# Patient Record
Sex: Female | Born: 1980 | Hispanic: Yes | Smoking: Current every day smoker
Health system: Southern US, Community
[De-identification: ages and names within clinical notes are randomized; demographics above are authoritative.]

## PROBLEM LIST (undated history)

## (undated) DIAGNOSIS — N189 Chronic kidney disease, unspecified: Secondary | ICD-10-CM

## (undated) DIAGNOSIS — I10 Essential (primary) hypertension: Secondary | ICD-10-CM

## (undated) DIAGNOSIS — M329 Systemic lupus erythematosus, unspecified: Secondary | ICD-10-CM

## (undated) DIAGNOSIS — D649 Anemia, unspecified: Secondary | ICD-10-CM

## (undated) HISTORY — PX: HERNIA REPAIR: SHX51

---

## 2014-09-05 ENCOUNTER — Encounter (HOSPITAL_COMMUNITY): Payer: Self-pay | Admitting: Emergency Medicine

## 2014-09-05 ENCOUNTER — Emergency Department (HOSPITAL_COMMUNITY)
Admission: EM | Admit: 2014-09-05 | Discharge: 2014-09-05 | Disposition: A | Discharge status: Home / Self Care | Payer: Medicaid Other | Attending: Emergency Medicine | Admitting: Emergency Medicine

## 2014-09-05 DIAGNOSIS — Z720 Tobacco use: Secondary | ICD-10-CM | POA: Insufficient documentation

## 2014-09-05 DIAGNOSIS — Z8739 Personal history of other diseases of the musculoskeletal system and connective tissue: Secondary | ICD-10-CM | POA: Insufficient documentation

## 2014-09-05 DIAGNOSIS — R112 Nausea with vomiting, unspecified: Secondary | ICD-10-CM | POA: Diagnosis present

## 2014-09-05 DIAGNOSIS — N189 Chronic kidney disease, unspecified: Secondary | ICD-10-CM | POA: Insufficient documentation

## 2014-09-05 DIAGNOSIS — Z862 Personal history of diseases of the blood and blood-forming organs and certain disorders involving the immune mechanism: Secondary | ICD-10-CM | POA: Diagnosis not present

## 2014-09-05 DIAGNOSIS — I129 Hypertensive chronic kidney disease with stage 1 through stage 4 chronic kidney disease, or unspecified chronic kidney disease: Secondary | ICD-10-CM | POA: Insufficient documentation

## 2014-09-05 DIAGNOSIS — Z79899 Other long term (current) drug therapy: Secondary | ICD-10-CM | POA: Diagnosis not present

## 2014-09-05 DIAGNOSIS — R1084 Generalized abdominal pain: Secondary | ICD-10-CM | POA: Diagnosis not present

## 2014-09-05 DIAGNOSIS — R197 Diarrhea, unspecified: Secondary | ICD-10-CM | POA: Insufficient documentation

## 2014-09-05 HISTORY — DX: Anemia, unspecified: D64.9

## 2014-09-05 HISTORY — DX: Essential (primary) hypertension: I10

## 2014-09-05 HISTORY — DX: Chronic kidney disease, unspecified: N18.9

## 2014-09-05 HISTORY — DX: Systemic lupus erythematosus, unspecified: M32.9

## 2014-09-05 LAB — BASIC METABOLIC PANEL
Anion gap: 8 (ref 5–15)
BUN: 36 mg/dL — ABNORMAL HIGH (ref 6–20)
CO2: 19 mmol/L — ABNORMAL LOW (ref 22–32)
CREATININE: 1.34 mg/dL — AB (ref 0.44–1.00)
Calcium: 9.2 mg/dL (ref 8.9–10.3)
Chloride: 109 mmol/L (ref 101–111)
GFR calc Af Amer: 59 mL/min — ABNORMAL LOW (ref >60)
GFR calc non Af Amer: 51 mL/min — ABNORMAL LOW (ref >60)
GLUCOSE: 112 mg/dL — AB (ref 65–99)
Potassium: 4.3 mmol/L (ref 3.5–5.1)
Sodium: 136 mmol/L (ref 135–145)

## 2014-09-05 LAB — CBC WITH DIFFERENTIAL/PLATELET
BASOS ABS: 0 10*3/uL (ref 0.0–0.1)
BASOS PCT: 0 % (ref 0–1)
Eosinophils Absolute: 0.1 10*3/uL (ref 0.0–0.7)
Eosinophils Relative: 1 % (ref 0–5)
HEMATOCRIT: 32.2 % — AB (ref 36.0–46.0)
HEMOGLOBIN: 11.2 g/dL — AB (ref 12.0–15.0)
LYMPHS PCT: 5 % — AB (ref 12–46)
Lymphs Abs: 0.6 10*3/uL — ABNORMAL LOW (ref 0.7–4.0)
MCH: 26 pg (ref 26.0–34.0)
MCHC: 34.8 g/dL (ref 30.0–36.0)
MCV: 74.7 fL — ABNORMAL LOW (ref 78.0–100.0)
MONOS PCT: 6 % (ref 3–12)
Monocytes Absolute: 0.8 10*3/uL (ref 0.1–1.0)
Neutro Abs: 11.2 10*3/uL — ABNORMAL HIGH (ref 1.7–7.7)
Neutrophils Relative %: 88 % — ABNORMAL HIGH (ref 43–77)
Platelets: 268 10*3/uL (ref 150–400)
RBC: 4.31 MIL/uL (ref 3.87–5.11)
RDW: 13.8 % (ref 11.5–15.5)
WBC: 12.7 10*3/uL — AB (ref 4.0–10.5)

## 2014-09-05 MED ORDER — PROMETHAZINE HCL 25 MG/ML IJ SOLN
12.5000 mg | Freq: Once | INTRAMUSCULAR | Status: AC
  Administered 2014-09-05: 12.5 mg via INTRAVENOUS
  Filled 2014-09-05: qty 1

## 2014-09-05 MED ORDER — SODIUM CHLORIDE 0.9 % IV BOLUS (SEPSIS)
1000.0000 mL | Freq: Once | INTRAVENOUS | Status: AC
  Administered 2014-09-05: 1000 mL via INTRAVENOUS

## 2014-09-05 MED ORDER — PROMETHAZINE HCL 25 MG PO TABS: 25.0000 mg | ORAL_TABLET | Freq: Four times a day (QID) | ORAL | Status: DC | PRN

## 2014-09-05 MED ORDER — METOCLOPRAMIDE HCL 5 MG/ML IJ SOLN
10.0000 mg | Freq: Once | INTRAMUSCULAR | Status: AC
  Administered 2014-09-05: 10 mg via INTRAVENOUS
  Filled 2014-09-05: qty 2

## 2014-09-05 NOTE — Discharge Instructions (Signed)

## 2014-09-05 NOTE — ED Provider Notes (Signed)
CSN: [id number]     Arrival date & time 09/05/14  0303 History   First MD Initiated Contact with Patient 09/05/14 0310     Chief Complaint  Patient presents with  . Nausea  . Emesis  . Diarrhea     (Consider location/radiation/quality/duration/timing/severity/associated sxs/prior Treatment) Patient is a 34 y.o. female presenting with vomiting and diarrhea. The history is provided by the patient. No language interpreter was used.  Emesis Severity:  Moderate Duration:  3 hours Number of daily episodes:  10-12 Chronicity:  New Recent urination:  Normal Relieved by:  Nothing Worsened by:  Nothing tried Associated symptoms: abdominal pain and diarrhea   Associated symptoms: no chills   Associated symptoms comment:  Patient with a history of lupus, CKD (baseline Cr 1.9), HTN and anemia presents with N, V, D for the past 3 hours. No fever. She complains of epigastric pain that is sharp, waxing and waning. She has had symptoms similar to this recurrently "but never this bad."  Diarrhea Associated symptoms: abdominal pain and vomiting   Associated symptoms: no chills and no fever     Past Medical History  Diagnosis Date  . Hypertension   . Chronic kidney disease   . Anemia   . Lupus    Past Surgical History  Procedure Laterality Date  . Hernia repair    . Cesarean section     History reviewed. No pertinent family history. History  Substance Use Topics  . Smoking status: Current Every Day Smoker -- 0.50 packs/day    Types: Cigarettes  . Smokeless tobacco: Never Used  . Alcohol Use: Yes     Comment: occassionally   OB History    No data available     Review of Systems  Constitutional: Negative for fever and chills.  Respiratory: Negative.  Negative for shortness of breath.   Cardiovascular: Negative.  Negative for chest pain.  Gastrointestinal: Positive for nausea, vomiting, abdominal pain and diarrhea.       No bloody emesis.  Musculoskeletal: Negative.   Skin:  Negative.   Neurological: Negative.       Allergies  Review of patient's allergies indicates no known allergies.  Home Medications   Prior to Admission medications   Medication Sig Start Date End Date Taking? Authorizing Provider  ALPRAZolam Duanne Moron) 0.25 MG tablet Take 0.25 mg by mouth at bedtime as needed for anxiety.   Yes Historical Provider, MD  citalopram (CELEXA) 40 MG tablet Take 40 mg by mouth daily.   Yes Historical Provider, MD  labetalol (NORMODYNE) 300 MG tablet Take 300 mg by mouth 2 (two) times daily.   Yes Historical Provider, MD  lisinopril (PRINIVIL,ZESTRIL) 10 MG tablet Take 10 mg by mouth daily.   Yes Historical Provider, MD  NIFEdipine (PROCARDIA XL/ADALAT-CC) 60 MG 24 hr tablet Take 60 mg by mouth daily.   Yes Historical Provider, MD  zolpidem (AMBIEN) 10 MG tablet Take 10 mg by mouth at bedtime.   Yes Historical Provider, MD   BP 141/94 mmHg  Pulse 81  Temp(Src) 97.8 F (36.6 C) (Oral)  Resp 15  Ht 5\' 4"  (1.626 m)  Wt 150 lb (68.04 kg)  BMI 25.73 kg/m2  SpO2 100%  LMP 08/10/2014 (Approximate) Physical Exam  Constitutional: She is oriented to person, place, and time. She appears well-developed and well-nourished.  HENT:  Mouth/Throat: Mucous membranes are dry.  Neck: Normal range of motion. Neck supple.  Cardiovascular: Normal rate.   No murmur heard. Pulmonary/Chest: Effort normal and breath  sounds normal. She has no wheezes. She has no rales.  Abdominal: Soft. Bowel sounds are normal. She exhibits no mass.  Diffusely tender. Soft abdomen.  Musculoskeletal: She exhibits no edema.  Neurological: She is alert and oriented to person, place, and time.  Skin: Skin is warm and dry.    ED Course  Procedures (including critical care time) Labs Review Labs Reviewed  BASIC METABOLIC PANEL  CBC WITH DIFFERENTIAL/PLATELET    Imaging Review No results found.   EKG Interpretation None      MDM   Final diagnoses:  None    1. Nausea, vomiting,  diarrhea  The patient feels much better after IV fluids. She appears more comfortable, resting. No further vomiting. Mild acidosis on lab studies likely secondary to diarrhea and excessive vomiting. Normal anion gap.  She is tolerating PO fluids without difficulty. Feel she is appropriate for discharge home.     Charlann Lange, PA-C 09/05/14 [place], DO 09/05/14 2327

## 2014-09-05 NOTE — ED Notes (Signed)
Pt tolerated Ginger ale. Denies nausea. States she feels better.

## 2014-09-05 NOTE — ED Notes (Signed)
Bed: WA08 Expected date:  Expected time:  Means of arrival:  Comments: EMS 34 yo female N/V/D x 3 hours

## 2014-09-05 NOTE — ED Notes (Addendum)
[facility] presnets with a 34 yo female with nausea, vomiting and diarrhea since midnight.  Pt admits to having 2 beers around 11:30 pm and shortly after having N/V/D symptoms.  Pt took 12 mg of Zofran by mouth without relief and [facility] gave 4 mg of Zofran IV with relief.  Some dizziness upon standing without orthostasis.  CBG=133 mg/dl; VS stable.  Hx of HTN and chronic renal failure with last creatinine at 1.9 per patient.

## 2015-05-28 ENCOUNTER — Emergency Department (HOSPITAL_COMMUNITY)
Admission: EM | Admit: 2015-05-28 | Discharge: 2015-05-28 | Disposition: A | Discharge status: Home / Self Care | Payer: Medicaid Other | Attending: Physician Assistant | Admitting: Physician Assistant

## 2015-05-28 ENCOUNTER — Emergency Department (HOSPITAL_COMMUNITY): Payer: Medicaid Other

## 2015-05-28 ENCOUNTER — Encounter (HOSPITAL_COMMUNITY): Payer: Self-pay | Admitting: Emergency Medicine

## 2015-05-28 DIAGNOSIS — Z862 Personal history of diseases of the blood and blood-forming organs and certain disorders involving the immune mechanism: Secondary | ICD-10-CM | POA: Insufficient documentation

## 2015-05-28 DIAGNOSIS — R0602 Shortness of breath: Secondary | ICD-10-CM | POA: Diagnosis not present

## 2015-05-28 DIAGNOSIS — Z8739 Personal history of other diseases of the musculoskeletal system and connective tissue: Secondary | ICD-10-CM | POA: Insufficient documentation

## 2015-05-28 DIAGNOSIS — N189 Chronic kidney disease, unspecified: Secondary | ICD-10-CM | POA: Insufficient documentation

## 2015-05-28 DIAGNOSIS — R42 Dizziness and giddiness: Secondary | ICD-10-CM | POA: Insufficient documentation

## 2015-05-28 DIAGNOSIS — I129 Hypertensive chronic kidney disease with stage 1 through stage 4 chronic kidney disease, or unspecified chronic kidney disease: Secondary | ICD-10-CM | POA: Insufficient documentation

## 2015-05-28 DIAGNOSIS — Z79899 Other long term (current) drug therapy: Secondary | ICD-10-CM | POA: Diagnosis not present

## 2015-05-28 DIAGNOSIS — F17210 Nicotine dependence, cigarettes, uncomplicated: Secondary | ICD-10-CM | POA: Diagnosis not present

## 2015-05-28 LAB — BASIC METABOLIC PANEL
ANION GAP: 9 (ref 5–15)
BUN: 36 mg/dL — ABNORMAL HIGH (ref 6–20)
CHLORIDE: 107 mmol/L (ref 101–111)
CO2: 22 mmol/L (ref 22–32)
Calcium: 9.5 mg/dL (ref 8.9–10.3)
Creatinine, Ser: 1.45 mg/dL — ABNORMAL HIGH (ref 0.44–1.00)
GFR calc non Af Amer: 46 mL/min — ABNORMAL LOW (ref >60)
GFR, EST AFRICAN AMERICAN: 53 mL/min — AB (ref >60)
GLUCOSE: 94 mg/dL (ref 65–99)
POTASSIUM: 4.2 mmol/L (ref 3.5–5.1)
Sodium: 138 mmol/L (ref 135–145)

## 2015-05-28 LAB — CBC
HCT: 32.9 % — ABNORMAL LOW (ref 36.0–46.0)
HEMOGLOBIN: 11.4 g/dL — AB (ref 12.0–15.0)
MCH: 25.3 pg — ABNORMAL LOW (ref 26.0–34.0)
MCHC: 34.7 g/dL (ref 30.0–36.0)
MCV: 73.1 fL — AB (ref 78.0–100.0)
Platelets: 264 10*3/uL (ref 150–400)
RBC: 4.5 MIL/uL (ref 3.87–5.11)
RDW: 14.1 % (ref 11.5–15.5)
WBC: 3.4 10*3/uL — ABNORMAL LOW (ref 4.0–10.5)

## 2015-05-28 LAB — I-STAT TROPONIN, ED: TROPONIN I, POC: 0 ng/mL (ref 0.00–0.08)

## 2015-05-28 MED ORDER — SODIUM CHLORIDE 0.9 % IV BOLUS (SEPSIS)
1000.0000 mL | Freq: Once | INTRAVENOUS | Status: AC
  Administered 2015-05-28: 1000 mL via INTRAVENOUS

## 2015-05-28 MED ORDER — IOHEXOL 350 MG/ML SOLN
100.0000 mL | Freq: Once | INTRAVENOUS | Status: AC | PRN
  Administered 2015-05-28: 100 mL via INTRAVENOUS

## 2015-05-28 MED ORDER — ONDANSETRON HCL 4 MG PO TABS: 4.0000 mg | ORAL_TABLET | Freq: Three times a day (TID) | ORAL | Status: DC | PRN

## 2015-05-28 NOTE — Discharge Instructions (Signed)
You had an abnormal CAT scan. We think it's from your lupus. However it could be calcification in your coronary arteries. Please follow-up with your regular doctor to see if they would like to a voltage gated CT for your coronary arteries.  Return with ANY chest pain.     Dizziness Dizziness is a common problem. It is a feeling of unsteadiness or light-headedness. You may feel like you are about to faint. Dizziness can lead to injury if you stumble or fall. Anyone can become dizzy, but dizziness is more common in older adults. This condition can be caused by a number of things, including medicines, dehydration, or illness. HOME CARE INSTRUCTIONS Taking these steps may help with your condition: Eating and Drinking  Drink enough fluid to keep your urine clear or pale yellow. This helps to keep you from becoming dehydrated. Try to drink more clear fluids, such as water.  Do not drink alcohol.  Limit your caffeine intake if directed by your health care provider.  Limit your salt intake if directed by your health care provider. Activity  Avoid making quick movements.  Rise slowly from chairs and steady yourself until you feel okay.  In the morning, first sit up on the side of the bed. When you feel okay, stand slowly while you hold onto something until you know that your balance is fine.  Move your legs often if you need to stand in one place for a long time. Tighten and relax your muscles in your legs while you are standing.  Do not drive or operate heavy machinery if you feel dizzy.  Avoid bending down if you feel dizzy. Place items in your home so that they are easy for you to reach without leaning over. Lifestyle  Do not use any tobacco products, including cigarettes, chewing tobacco, or electronic cigarettes. If you need help quitting, ask your health care provider.  Try to reduce your stress level, such as with yoga or meditation. Talk with your health care provider if you need  help. General Instructions  Watch your dizziness for any changes.  Take medicines only as directed by your health care provider. Talk with your health care provider if you think that your dizziness is caused by a medicine that you are taking.  Tell a friend or a family member that you are feeling dizzy. If he or she notices any changes in your behavior, have this person call your health care provider.  Keep all follow-up visits as directed by your health care provider. This is important. SEEK MEDICAL CARE IF:  Your dizziness does not go away.  Your dizziness or light-headedness gets worse.  You feel nauseous.  You have reduced hearing.  You have new symptoms.  You are unsteady on your feet or you feel like the room is spinning. SEEK IMMEDIATE MEDICAL CARE IF:  You vomit or have diarrhea and are unable to eat or drink anything.  You have problems talking, walking, swallowing, or using your arms, hands, or legs.  You feel generally weak.  You are not thinking clearly or you have trouble forming sentences. It may take a friend or family member to notice this.  You have chest pain, abdominal pain, shortness of breath, or sweating.  Your vision changes.  You notice any bleeding.  You have a headache.  You have neck pain or a stiff neck.  You have a fever.   This information is not intended to replace advice given to you by your health  care provider. Make sure you discuss any questions you have with your health care provider.   Document Released: 08/17/2000 Document Revised: 07/08/2014 Document Reviewed: 02/17/2014 Elsevier Interactive Patient Education [employer].

## 2015-05-28 NOTE — ED Notes (Signed)
Chest pressure, sob, fatigue, and dizziness worsening over the last week. States daily activities of living are causing her to loose her breath and stamina easily. Denies leg swelling, cough, previous hx of heart problems.

## 2015-05-28 NOTE — ED Provider Notes (Signed)
CSN: [id number]     Arrival date & time 05/28/15  [id number] History   First MD Initiated Contact with Patient 05/28/15 1116     Chief Complaint  Patient presents with  . Chest Pain  . Shortness of Breath     (Consider location/radiation/quality/duration/timing/severity/associated sxs/prior Treatment) HPI   Patient is a 76 old female presenting with parents breath and dizziness. Patient's daughter had gastroenteritis last week. Patient's noticed one loose stool today. She reports that she's had dizziness for the last 2 weeks. She dizziness prior to her sickness. She reports she's had intermittent dizziness feels like lightheadedness. However it's been at rest.This happened on and off for the last couple months.  Patient's history of lupus anemia and chronic kidney disease.  Patient here she is concerned about dehydration.    Past Medical History  Diagnosis Date  . Hypertension   . Chronic kidney disease   . Anemia   . Lupus [facility])    Past Surgical History  Procedure Laterality Date  . Hernia repair    . Cesarean section     History reviewed. No pertinent family history. Social History  Substance Use Topics  . Smoking status: Current Every Day Smoker -- 0.50 packs/day    Types: Cigarettes  . Smokeless tobacco: Never Used  . Alcohol Use: Yes     Comment: occassionally   OB History    No data available     Review of Systems  Constitutional: Negative for activity change.  Respiratory: Negative for shortness of breath.   Cardiovascular: Negative for chest pain.  Gastrointestinal: Negative for abdominal pain.  Musculoskeletal: Negative for back pain.  Neurological: Positive for dizziness and light-headedness. Negative for seizures and numbness.  Psychiatric/Behavioral: Negative for agitation.      Allergies  Review of patient's allergies indicates no known allergies.  Home Medications   Prior to Admission medications   Medication Sig Start Date End Date Taking?  Authorizing Provider  ALPRAZolam (XANAX) 0.25 MG tablet Take 0.25 mg by mouth daily.    Yes Historical Provider, MD  labetalol (NORMODYNE) 300 MG tablet Take 300 mg by mouth 2 (two) times daily.   Yes Historical Provider, MD  lisinopril (PRINIVIL,ZESTRIL) 10 MG tablet Take 10 mg by mouth daily.   Yes Historical Provider, MD  NIFEdipine (PROCARDIA XL/ADALAT-CC) 60 MG 24 hr tablet Take 60 mg by mouth daily.   Yes Historical Provider, MD  zolpidem (AMBIEN) 10 MG tablet Take 10 mg by mouth at bedtime as needed for sleep.    Yes Historical Provider, MD  citalopram (CELEXA) 40 MG tablet Take 40 mg by mouth daily.    Historical Provider, MD   BP 113/73 mmHg  Pulse 70  Temp(Src) 98.1 F (36.7 C) (Oral)  Resp 17  SpO2 100%  LMP 05/04/2015 Physical Exam  Constitutional: She is oriented to person, place, and time. She appears well-developed and well-nourished.  HENT:  Head: Normocephalic and atraumatic.  Eyes: Conjunctivae are normal. Right eye exhibits no discharge.  Neck: Neck supple.  Cardiovascular: Normal rate, regular rhythm and normal heart sounds.   No murmur heard. Pulmonary/Chest: Effort normal and breath sounds normal. She has no wheezes. She has no rales.  Abdominal: Soft. She exhibits no distension. There is no tenderness.  Musculoskeletal: Normal range of motion. She exhibits no edema.  Neurological: She is oriented to person, place, and time. No cranial nerve deficit.  Skin: Skin is warm and dry. No rash noted. She is not diaphoretic.  Psychiatric: She has  a normal mood and affect. Her behavior is normal.  Nursing note and vitals reviewed.   ED Course  Procedures (including critical care time) Labs Review Labs Reviewed  BASIC METABOLIC PANEL - Abnormal; Notable for the following:    BUN 36 (*)    Creatinine, Ser 1.45 (*)    GFR calc non Af Amer 46 (*)    GFR calc Af Amer 53 (*)    All other components within normal limits  CBC - Abnormal; Notable for the following:     WBC 3.4 (*)    Hemoglobin 11.4 (*)    HCT 32.9 (*)    MCV 73.1 (*)    MCH 25.3 (*)    All other components within normal limits  I-STAT TROPOININ, ED    Imaging Review Dg Chest 2 View  05/28/2015  CLINICAL DATA:  Mid chest pain/ pressure with shortness breath for 1 week. Smoker. EXAM: CHEST  2 VIEW COMPARISON:  None. FINDINGS: The heart size and mediastinal contours are normal. There is a curvilinear oval calcification measuring up to 2.3 cm, superimposed over the upper left cardiac shadow in the region of the left atrial appendage. The lungs are clear. There is no pleural effusion or pneumothorax. No osseous abnormalities are seen. IMPRESSION: 1. No acute cardiopulmonary process. 2. Atypical left mediastinal calcification. Although likely a postinflammatory lymph node, a coronary artery aneurysm cannot be completely excluded. Consider CTA for further evaluation. Electronically Signed   By: Richardean Sale M.D.   On: 05/28/2015 09:42   Ct Angio Chest Aorta W/cm &/or Wo/cm  05/28/2015  CLINICAL DATA:  Indeterminate left mediastinal calcification on chest radiograph. Shortness of breath. EXAM: CT ANGIOGRAPHY CHEST WITH CONTRAST TECHNIQUE: Multidetector CT imaging of the chest was performed using the standard protocol during bolus administration of intravenous contrast. Multiplanar CT image reconstructions and MIPs were obtained to evaluate the vascular anatomy. CONTRAST:  11mL OMNIPAQUE IOHEXOL 350 MG/ML SOLN COMPARISON:  Chest radiograph from earlier today. FINDINGS: Mediastinum/Nodes: The study is high quality for the evaluation of pulmonary embolism. There are no filling defects in the central, lobar, segmental or subsegmental pulmonary artery branches to suggest acute pulmonary embolism. Great vessels are normal in course and caliber. Normal heart size. No pericardial fluid/thickening. There are adjacent 1.9 x 1.7 cm and 2.0 x 1.7 cm peripherally coarsely calcified round structures in the left  pericardium in close proximity to the left main coronary artery bifurcation (series 13/ images 122 and 135), evaluation of which is limited by cardiac motion artifact on this nongated study, and which are favored to represent focal pericardial calcifications. Normal visualized thyroid. Normal esophagus. There are several mildly enlarged axillary lymph nodes bilaterally, largest 1.2 cm on the right (series 5/image 20) and 1.0 cm on the left (series 5/ image 13) . No pathologically enlarged mediastinal or hilar nodes. Lungs/Pleura: No pneumothorax. No pleural effusion. No acute consolidative airspace disease, significant pulmonary nodules or lung masses. Upper abdomen: Flash filling 0.6 cm lesion in the lateral segment left liver lobe (series 5/ image 81). Musculoskeletal:  No aggressive appearing focal osseous lesions. Review of the MIP images confirms the above findings. IMPRESSION: 1. Two adjacent round peripherally calcified structures in the left pericardium near the left main coronary artery bifurcation, evaluation of which is limited by cardiac motion on this nongated study. These are favored to represent focal pericardial calcifications such as from a prior episode of pericarditis in this patient with a history of lupus. Calcified left coronary artery aneurysms are  less likely. If there is clinical concern for a coronary artery aneurysm, consider further evaluation with a gated coronary CT angiogram without and with IV contrast on a short term outpatient basis. 2. No pulmonary embolism. 3. No active pulmonary disease. No pleural or pericardial effusions. 4. Mild bilateral axillary lymphadenopathy, nonspecific, probably due to the patient's history of lupus. 5. Flash filling 0.6 cm left liver lobe lesion, too small to characterize. No further imaging follow-up is required for this probably benign lesion, unless the patient has a history of cirrhosis, malignancy or other liver risk factors. This recommendation  follows ACR consensus guidelines: Managing Incidental Findings on Abdominal CT: White Paper of the ACR Incidental Findings Committee. Nat[facility] [phone] Electronically Signed   By: Ilona Sorrel M.D.   On: 05/28/2015 13:09   I have personally reviewed and evaluated these images and lab results as part of my medical decision-making.   EKG Interpretation   Date/Time:  Thursday May 28 2015 09:17:39 EDT Ventricular Rate:  68 PR Interval:  147 QRS Duration: 91 QT Interval:  386 QTC Calculation: 410 R Axis:   76 Text Interpretation:  Sinus rhythm Baseline wander in lead(s) V4 no acute  ischemia Confirmed by Gerald Leitz ([id number]) on 05/28/2015 11:19:36 AM      MDM   Final diagnoses:  None    Patient is a 59 old female with chronic kidney disease stenting today with intermittent dizziness. This was worsened after patient got diarrhea. Suspect that this is likely due to dehydration. Chest x-ray showed calcification which called for CTA. We'll do CTA given patient's intermittent dizziness before this illness. We'll give fluids to make patient feel symptomatically better. Anticipate ability to discharge home given normal physical exam vital signs and labs.   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 We discussed additional imaging I don't think it's warranted at this time. We'll have her follow-up with her lupus physician who will determine whether she needs CT voltage gated CAT scan. She does not have any other risk factors for coronary artery disease. She feels improved after fluids.   Korina Tretter Julio Alm, MD 05/28/15 [id number]

## 2016-12-30 ENCOUNTER — Encounter (HOSPITAL_COMMUNITY): Payer: Self-pay

## 2016-12-30 ENCOUNTER — Emergency Department (HOSPITAL_COMMUNITY)
Admission: EM | Admit: 2016-12-30 | Discharge: 2016-12-31 | Disposition: A | Discharge status: Home / Self Care | Payer: Medicaid Other | Attending: Emergency Medicine | Admitting: Emergency Medicine

## 2016-12-30 DIAGNOSIS — Z79899 Other long term (current) drug therapy: Secondary | ICD-10-CM | POA: Diagnosis not present

## 2016-12-30 DIAGNOSIS — I129 Hypertensive chronic kidney disease with stage 1 through stage 4 chronic kidney disease, or unspecified chronic kidney disease: Secondary | ICD-10-CM | POA: Diagnosis not present

## 2016-12-30 DIAGNOSIS — T783XXA Angioneurotic edema, initial encounter: Secondary | ICD-10-CM | POA: Insufficient documentation

## 2016-12-30 DIAGNOSIS — N189 Chronic kidney disease, unspecified: Secondary | ICD-10-CM | POA: Diagnosis not present

## 2016-12-30 DIAGNOSIS — F17210 Nicotine dependence, cigarettes, uncomplicated: Secondary | ICD-10-CM | POA: Insufficient documentation

## 2016-12-30 DIAGNOSIS — R600 Localized edema: Secondary | ICD-10-CM | POA: Diagnosis present

## 2016-12-30 MED ORDER — DIPHENHYDRAMINE HCL 25 MG PO CAPS
25.0000 mg | ORAL_CAPSULE | Freq: Once | ORAL | Status: AC
Start: 2016-12-30 — End: 2016-12-30
  Administered 2016-12-30: 25 mg via ORAL
  Filled 2016-12-30: qty 1

## 2016-12-30 MED ORDER — FAMOTIDINE 20 MG PO TABS
20.0000 mg | ORAL_TABLET | Freq: Once | ORAL | Status: AC
Start: 2016-12-30 — End: 2016-12-30
  Administered 2016-12-30: 20 mg via ORAL
  Filled 2016-12-30: qty 1

## 2016-12-30 NOTE — ED Triage Notes (Signed)
Pt states she has been taking lisinopril for 4 years, at 5am she noticed her top lip swelling and she went to Urgent Care and received prednisone and benedryl, sfter her nap her bottom lip was swollen

## 2016-12-31 NOTE — Discharge Instructions (Signed)
Do not take ACE inhibitors (blood pressure medications ends in "-pril") and list them as an allergy.

## 2017-01-10 NOTE — ED Provider Notes (Signed)
[place] DEPT Provider Note   CSN: [id number] Arrival date & time: 12/30/16  1950     History   Chief Complaint Chief Complaint  Patient presents with  . Angioedema    HPI Latasha Drake is a 36 y.o. female.  HPI   36 year old female with lip swelling.  First noticed around 5 AM this morning.  Swelling upper lip.  She went to urgent care and received prednisone and Benadryl.  She socially went home and laid down to take a nap.  When she woke up her bottom lip was swollen.  Symptoms have been stable since then..  No difficulty swallowing.  No drooling.  No shortness of breath.  No GI complaints.  No dizziness or lightheadedness.  She is on lisinopril.  Past Medical History:  Diagnosis Date  . Anemia   . Chronic kidney disease   . Hypertension   . Lupus     There are no active problems to display for this patient.   Past Surgical History:  Procedure Laterality Date  . CESAREAN SECTION    . HERNIA REPAIR      OB History    No data available       Home Medications    Prior to Admission medications   Medication Sig Start Date End Date Taking? Authorizing Provider  citalopram (CELEXA) 40 MG tablet Take 40 mg by mouth daily.  10/27/16 10/27/17 Yes [provider]  labetalol (NORMODYNE) 100 MG tablet TAKE ONE TABLET (100 MG TOTAL) BY MOUTH 2 (TWO) TIMES DAILY. 11/17/16  Yes [provider]  lisinopril (PRINIVIL,ZESTRIL) 10 MG tablet Take 10 mg by mouth daily.   Yes [provider]  NIFEdipine (PROCARDIA-XL/ADALAT CC) 60 MG 24 hr tablet Take 60 mg by mouth daily. 12/03/16  Yes [provider]  pantoprazole (PROTONIX) 40 MG tablet TAKE ONE TABLET (40 MG TOTAL) BY MOUTH DAILY PRN FOR INDIGESTION 10/27/16  Yes [provider]  sucralfate (CARAFATE) 1 GM/10ML suspension Take 1 g by mouth daily as needed (indigestion).  10/27/16  Yes [provider]  Vitamin D, Ergocalciferol, (DRISDOL) 50000  units CAPS capsule Take 50,000 Units by mouth every 7 (seven) days. Take on Sun. 06/23/15  Yes [provider]  zolpidem (AMBIEN) 5 MG tablet TAKE ONE TABLET (5 MG TOTAL) BY MOUTH AT BEDTIME 12/26/16  Yes [provider]  ondansetron (ZOFRAN) 4 MG tablet Take 1 tablet (4 mg total) by mouth every 8 (eight) hours as needed for nausea or vomiting. Patient not taking: Reported on 12/30/2016 05/28/15   Macarthur Critchley, MD    Family History History reviewed. No pertinent family history.  Social History Social History   Tobacco Use  . Smoking status: Current Every Day Smoker    Packs/day: 0.50    Types: Cigarettes  . Smokeless tobacco: Never Used  Substance Use Topics  . Alcohol use: Yes    Comment: occassionally  . Drug use: No     Allergies   Patient has no known allergies.   Review of Systems Review of Systems  All systems reviewed and negative, other than as noted in HPI.  Physical Exam Updated Vital Signs BP (!) 152/96 (BP Location: Left Arm)   Pulse 72   Temp 98.2 F (36.8 C) (Oral)   Resp 14   LMP 11/24/2016   SpO2 95%   Physical Exam  Constitutional: She appears well-developed and well-nourished. No distress.  HENT:  Head: Normocephalic.  Upper/lower lip swelling. Normal  appearing tongue. Handling secretions. Normal sounding voice.   Eyes: Conjunctivae are normal. Right eye exhibits no discharge. Left eye exhibits no discharge.  Neck: Neck supple.  Cardiovascular: Normal rate, regular rhythm and normal heart sounds. Exam reveals no gallop and no friction rub.  No murmur heard. Pulmonary/Chest: Effort normal and breath sounds normal. No respiratory distress.  Abdominal: Soft. She exhibits no distension. There is no tenderness.  Musculoskeletal: She exhibits no edema or tenderness.  Neurological: She is alert.  Skin: Skin is warm and dry.  Psychiatric: She has a normal mood and affect. Her behavior is normal. Thought content normal.    Nursing note and vitals reviewed.    ED Treatments / Results  Labs (all labs ordered are listed, but only abnormal results are displayed) Labs Reviewed - No data to display  EKG  EKG Interpretation None       Radiology No results found.  Procedures Procedures (including critical care time)  Medications Ordered in ED Medications  famotidine (PEPCID) tablet 20 mg (20 mg Oral Given 12/30/16 2228)  diphenhydrAMINE (BENADRYL) capsule 25 mg (25 mg Oral Given 12/30/16 2228)     Initial Impression / Assessment and Plan / ED Course  I have reviewed the triage vital signs and the nursing notes.  Pertinent labs & imaging results that were available during my care of the patient were reviewed by me and considered in my medical decision making (see chart for details).     Angioedema. Likely ACEI induced. Symptoms now improving. I think she is safe for DC at this time. Advised to list as allergy. It has been determined that no acute conditions requiring further emergency intervention are present at this time. The patient has been advised of the diagnosis and plan. I reviewed any labs and imaging including any potential incidental findings. We have discussed signs and symptoms that warrant return to the ED and they are listed in the discharge instructions.    Final Clinical Impressions(s) / ED Diagnoses   Final diagnoses:  Angioedema, initial encounter    ED Discharge Orders    None       Virgel Manifold, MD 01/10/17 1610

## 2018-10-03 ENCOUNTER — Inpatient Hospital Stay (HOSPITAL_COMMUNITY)
Admission: EM | Admit: 2018-10-03 | Discharge: 2018-10-05 | DRG: 812 | Disposition: A | Discharge status: Home / Self Care | Payer: Medicaid Other | Attending: Internal Medicine | Admitting: Internal Medicine

## 2018-10-03 ENCOUNTER — Other Ambulatory Visit: Payer: Self-pay

## 2018-10-03 ENCOUNTER — Encounter (HOSPITAL_COMMUNITY): Payer: Self-pay

## 2018-10-03 DIAGNOSIS — D649 Anemia, unspecified: Secondary | ICD-10-CM

## 2018-10-03 DIAGNOSIS — M7989 Other specified soft tissue disorders: Secondary | ICD-10-CM

## 2018-10-03 DIAGNOSIS — I3139 Other pericardial effusion (noninflammatory): Secondary | ICD-10-CM

## 2018-10-03 DIAGNOSIS — I10 Essential (primary) hypertension: Secondary | ICD-10-CM | POA: Diagnosis present

## 2018-10-03 DIAGNOSIS — S37012A Minor contusion of left kidney, initial encounter: Secondary | ICD-10-CM | POA: Diagnosis present

## 2018-10-03 DIAGNOSIS — I313 Pericardial effusion (noninflammatory): Secondary | ICD-10-CM | POA: Diagnosis present

## 2018-10-03 DIAGNOSIS — N179 Acute kidney failure, unspecified: Secondary | ICD-10-CM | POA: Diagnosis present

## 2018-10-03 DIAGNOSIS — N189 Chronic kidney disease, unspecified: Secondary | ICD-10-CM | POA: Diagnosis present

## 2018-10-03 DIAGNOSIS — R7989 Other specified abnormal findings of blood chemistry: Secondary | ICD-10-CM

## 2018-10-03 DIAGNOSIS — Z20828 Contact with and (suspected) exposure to other viral communicable diseases: Secondary | ICD-10-CM | POA: Diagnosis present

## 2018-10-03 DIAGNOSIS — D509 Iron deficiency anemia, unspecified: Secondary | ICD-10-CM | POA: Diagnosis present

## 2018-10-03 DIAGNOSIS — S37092A Other injury of left kidney, initial encounter: Secondary | ICD-10-CM

## 2018-10-03 DIAGNOSIS — M329 Systemic lupus erythematosus, unspecified: Secondary | ICD-10-CM | POA: Diagnosis present

## 2018-10-03 DIAGNOSIS — IMO0002 Reserved for concepts with insufficient information to code with codable children: Secondary | ICD-10-CM | POA: Diagnosis present

## 2018-10-03 DIAGNOSIS — J90 Pleural effusion, not elsewhere classified: Secondary | ICD-10-CM

## 2018-10-03 DIAGNOSIS — Z975 Presence of (intrauterine) contraceptive device: Secondary | ICD-10-CM

## 2018-10-03 DIAGNOSIS — F17210 Nicotine dependence, cigarettes, uncomplicated: Secondary | ICD-10-CM | POA: Diagnosis present

## 2018-10-03 DIAGNOSIS — M3214 Glomerular disease in systemic lupus erythematosus: Secondary | ICD-10-CM | POA: Diagnosis present

## 2018-10-03 DIAGNOSIS — Y838 Other surgical procedures as the cause of abnormal reaction of the patient, or of later complication, without mention of misadventure at the time of the procedure: Secondary | ICD-10-CM | POA: Diagnosis present

## 2018-10-03 DIAGNOSIS — I129 Hypertensive chronic kidney disease with stage 1 through stage 4 chronic kidney disease, or unspecified chronic kidney disease: Secondary | ICD-10-CM | POA: Diagnosis present

## 2018-10-03 DIAGNOSIS — D62 Acute posthemorrhagic anemia: Principal | ICD-10-CM | POA: Diagnosis present

## 2018-10-03 LAB — I-STAT BETA HCG BLOOD, ED (MC, WL, AP ONLY): I-stat hCG, quantitative: <5 m[IU]/mL (ref <5)

## 2018-10-03 MED ORDER — SODIUM CHLORIDE 0.9% FLUSH
3.0000 mL | Freq: Once | INTRAVENOUS | Status: AC
  Administered 2018-10-04: 04:00:00 3 mL via INTRAVENOUS

## 2018-10-03 NOTE — ED Triage Notes (Signed)
Pt reports left side abd pain and generalized weakness. Pt had a kidney biopsy done Friday. No bruising or swelling noted in the area. Pt denies n/v. GI/GU WNL.

## 2018-10-04 ENCOUNTER — Inpatient Hospital Stay (HOSPITAL_COMMUNITY): Payer: Medicaid Other

## 2018-10-04 ENCOUNTER — Emergency Department (HOSPITAL_COMMUNITY): Payer: Medicaid Other

## 2018-10-04 ENCOUNTER — Encounter (HOSPITAL_COMMUNITY): Payer: Self-pay | Admitting: Internal Medicine

## 2018-10-04 ENCOUNTER — Other Ambulatory Visit (HOSPITAL_COMMUNITY): Payer: Medicaid Other

## 2018-10-04 DIAGNOSIS — Y838 Other surgical procedures as the cause of abnormal reaction of the patient, or of later complication, without mention of misadventure at the time of the procedure: Secondary | ICD-10-CM | POA: Diagnosis present

## 2018-10-04 DIAGNOSIS — I313 Pericardial effusion (noninflammatory): Secondary | ICD-10-CM | POA: Insufficient documentation

## 2018-10-04 DIAGNOSIS — R7989 Other specified abnormal findings of blood chemistry: Secondary | ICD-10-CM

## 2018-10-04 DIAGNOSIS — I3139 Other pericardial effusion (noninflammatory): Secondary | ICD-10-CM | POA: Insufficient documentation

## 2018-10-04 DIAGNOSIS — I10 Essential (primary) hypertension: Secondary | ICD-10-CM | POA: Diagnosis present

## 2018-10-04 DIAGNOSIS — M3214 Glomerular disease in systemic lupus erythematosus: Secondary | ICD-10-CM | POA: Diagnosis present

## 2018-10-04 DIAGNOSIS — N179 Acute kidney failure, unspecified: Secondary | ICD-10-CM | POA: Diagnosis present

## 2018-10-04 DIAGNOSIS — M329 Systemic lupus erythematosus, unspecified: Secondary | ICD-10-CM | POA: Diagnosis present

## 2018-10-04 DIAGNOSIS — D649 Anemia, unspecified: Secondary | ICD-10-CM | POA: Diagnosis not present

## 2018-10-04 DIAGNOSIS — S37092A Other injury of left kidney, initial encounter: Secondary | ICD-10-CM | POA: Insufficient documentation

## 2018-10-04 DIAGNOSIS — M7989 Other specified soft tissue disorders: Secondary | ICD-10-CM | POA: Diagnosis not present

## 2018-10-04 DIAGNOSIS — Z20828 Contact with and (suspected) exposure to other viral communicable diseases: Secondary | ICD-10-CM | POA: Diagnosis present

## 2018-10-04 DIAGNOSIS — N189 Chronic kidney disease, unspecified: Secondary | ICD-10-CM | POA: Diagnosis present

## 2018-10-04 DIAGNOSIS — D62 Acute posthemorrhagic anemia: Secondary | ICD-10-CM | POA: Diagnosis present

## 2018-10-04 DIAGNOSIS — F17210 Nicotine dependence, cigarettes, uncomplicated: Secondary | ICD-10-CM | POA: Diagnosis present

## 2018-10-04 DIAGNOSIS — D509 Iron deficiency anemia, unspecified: Secondary | ICD-10-CM | POA: Diagnosis present

## 2018-10-04 DIAGNOSIS — S37012A Minor contusion of left kidney, initial encounter: Secondary | ICD-10-CM | POA: Diagnosis present

## 2018-10-04 DIAGNOSIS — I129 Hypertensive chronic kidney disease with stage 1 through stage 4 chronic kidney disease, or unspecified chronic kidney disease: Secondary | ICD-10-CM | POA: Diagnosis present

## 2018-10-04 DIAGNOSIS — Z975 Presence of (intrauterine) contraceptive device: Secondary | ICD-10-CM | POA: Diagnosis not present

## 2018-10-04 LAB — URINALYSIS, ROUTINE W REFLEX MICROSCOPIC
Bacteria, UA: NONE SEEN
Bilirubin Urine: NEGATIVE
Glucose, UA: NEGATIVE mg/dL
Ketones, ur: NEGATIVE mg/dL
Leukocytes,Ua: NEGATIVE
Nitrite: NEGATIVE
Protein, ur: 100 mg/dL — AB
Specific Gravity, Urine: 1.014 (ref 1.005–1.030)
pH: 5 (ref 5.0–8.0)

## 2018-10-04 LAB — CBC
HCT: 20.7 % — ABNORMAL LOW (ref 36.0–46.0)
Hemoglobin: 6.8 g/dL — CL (ref 12.0–15.0)
MCH: 23 pg — ABNORMAL LOW (ref 26.0–34.0)
MCHC: 32.9 g/dL (ref 30.0–36.0)
MCV: 69.9 fL — ABNORMAL LOW (ref 80.0–100.0)
Platelets: 334 10*3/uL (ref 150–400)
RBC: 2.96 MIL/uL — ABNORMAL LOW (ref 3.87–5.11)
RDW: 16.9 % — ABNORMAL HIGH (ref 11.5–15.5)
WBC: 9.1 10*3/uL (ref 4.0–10.5)
nRBC: 0 % (ref 0.0–0.2)

## 2018-10-04 LAB — COMPREHENSIVE METABOLIC PANEL
ALT: 18 U/L (ref 0–44)
AST: 19 U/L (ref 15–41)
Albumin: 3 g/dL — ABNORMAL LOW (ref 3.5–5.0)
Alkaline Phosphatase: 83 U/L (ref 38–126)
Anion gap: 12 (ref 5–15)
BUN: 41 mg/dL — ABNORMAL HIGH (ref 6–20)
CO2: 20 mmol/L — ABNORMAL LOW (ref 22–32)
Calcium: 9.1 mg/dL (ref 8.9–10.3)
Chloride: 103 mmol/L (ref 98–111)
Creatinine, Ser: 3.38 mg/dL — ABNORMAL HIGH (ref 0.44–1.00)
GFR calc Af Amer: 19 mL/min — ABNORMAL LOW (ref >60)
GFR calc non Af Amer: 16 mL/min — ABNORMAL LOW (ref >60)
Glucose, Bld: 116 mg/dL — ABNORMAL HIGH (ref 70–99)
Potassium: 4 mmol/L (ref 3.5–5.1)
Sodium: 135 mmol/L (ref 135–145)
Total Bilirubin: 0.3 mg/dL (ref 0.3–1.2)
Total Protein: 6.8 g/dL (ref 6.5–8.1)

## 2018-10-04 LAB — PREPARE RBC (CROSSMATCH)

## 2018-10-04 LAB — SARS CORONAVIRUS 2 BY RT PCR (HOSPITAL ORDER, PERFORMED IN ~~LOC~~ HOSPITAL LAB): SARS Coronavirus 2: NEGATIVE

## 2018-10-04 LAB — HEMOGLOBIN AND HEMATOCRIT, BLOOD
HCT: 24.6 % — ABNORMAL LOW (ref 36.0–46.0)
Hemoglobin: 8.2 g/dL — ABNORMAL LOW (ref 12.0–15.0)

## 2018-10-04 LAB — ECHOCARDIOGRAM COMPLETE

## 2018-10-04 LAB — CBG MONITORING, ED: Glucose-Capillary: 97 mg/dL (ref 70–99)

## 2018-10-04 LAB — D-DIMER, QUANTITATIVE: D-Dimer, Quant: 6.08 ug/mL-FEU — ABNORMAL HIGH (ref 0.00–0.50)

## 2018-10-04 LAB — LIPASE, BLOOD: Lipase: 32 U/L (ref 11–51)

## 2018-10-04 LAB — ABO/RH: ABO/RH(D): O POS

## 2018-10-04 MED ORDER — TECHNETIUM TO 99M ALBUMIN AGGREGATED
1.6000 | Freq: Once | INTRAVENOUS | Status: AC | PRN
  Administered 2018-10-04: 13:00:00 1.6 via INTRAVENOUS

## 2018-10-04 MED ORDER — ACETAMINOPHEN 325 MG PO TABS
650.0000 mg | ORAL_TABLET | Freq: Four times a day (QID) | ORAL | Status: DC | PRN
  Administered 2018-10-04: 20:00:00 650 mg via ORAL
  Filled 2018-10-04 (×2): qty 2

## 2018-10-04 MED ORDER — CITALOPRAM HYDROBROMIDE 20 MG PO TABS
40.0000 mg | ORAL_TABLET | Freq: Every day | ORAL | Status: DC
  Administered 2018-10-04 – 2018-10-05 (×2): 40 mg via ORAL
  Filled 2018-10-04: qty 4
  Filled 2018-10-04: qty 2

## 2018-10-04 MED ORDER — ONDANSETRON HCL 4 MG PO TABS: 4.0000 mg | ORAL_TABLET | Freq: Four times a day (QID) | ORAL | Status: DC | PRN

## 2018-10-04 MED ORDER — MORPHINE SULFATE (PF) 4 MG/ML IV SOLN
4.0000 mg | Freq: Once | INTRAVENOUS | Status: AC
  Administered 2018-10-04: 4 mg via INTRAVENOUS
  Filled 2018-10-04: qty 1

## 2018-10-04 MED ORDER — MORPHINE SULFATE (PF) 2 MG/ML IV SOLN
4.0000 mg | INTRAVENOUS | Status: DC | PRN
  Administered 2018-10-04 – 2018-10-05 (×7): 4 mg via INTRAVENOUS
  Filled 2018-10-04 (×7): qty 2

## 2018-10-04 MED ORDER — ONDANSETRON HCL 4 MG/2ML IJ SOLN
4.0000 mg | Freq: Once | INTRAMUSCULAR | Status: AC
  Administered 2018-10-04: 4 mg via INTRAVENOUS
  Filled 2018-10-04: qty 2

## 2018-10-04 MED ORDER — SODIUM CHLORIDE 0.9 % IV SOLN
10.0000 mL/h | Freq: Once | INTRAVENOUS | Status: AC
  Administered 2018-10-04: 05:00:00 10 mL/h via INTRAVENOUS

## 2018-10-04 MED ORDER — SODIUM CHLORIDE 0.9% FLUSH
3.0000 mL | Freq: Two times a day (BID) | INTRAVENOUS | Status: DC
  Administered 2018-10-04 – 2018-10-05 (×3): 3 mL via INTRAVENOUS

## 2018-10-04 MED ORDER — PANTOPRAZOLE SODIUM 40 MG PO TBEC
40.0000 mg | DELAYED_RELEASE_TABLET | Freq: Once | ORAL | Status: AC
  Administered 2018-10-04: 15:00:00 40 mg via ORAL
  Filled 2018-10-04: qty 1

## 2018-10-04 MED ORDER — NIFEDIPINE ER OSMOTIC RELEASE 60 MG PO TB24
60.0000 mg | ORAL_TABLET | Freq: Every day | ORAL | Status: DC
  Administered 2018-10-04 – 2018-10-05 (×2): 60 mg via ORAL
  Filled 2018-10-04 (×2): qty 1

## 2018-10-04 MED ORDER — ACETAMINOPHEN 650 MG RE SUPP: 650.0000 mg | Freq: Four times a day (QID) | RECTAL | Status: DC | PRN

## 2018-10-04 MED ORDER — OXYCODONE HCL 5 MG PO TABS
5.0000 mg | ORAL_TABLET | ORAL | Status: DC | PRN
  Administered 2018-10-04 – 2018-10-05 (×5): 5 mg via ORAL
  Filled 2018-10-04 (×5): qty 1

## 2018-10-04 MED ORDER — ONDANSETRON HCL 4 MG/2ML IJ SOLN: 4.0000 mg | Freq: Four times a day (QID) | INTRAMUSCULAR | Status: DC | PRN

## 2018-10-04 MED ORDER — LABETALOL HCL 100 MG PO TABS
100.0000 mg | ORAL_TABLET | Freq: Two times a day (BID) | ORAL | Status: DC
  Administered 2018-10-04 – 2018-10-05 (×3): 100 mg via ORAL
  Filled 2018-10-04 (×3): qty 1

## 2018-10-04 NOTE — Progress Notes (Addendum)
  PROGRESS NOTE    Latasha Drake  PYK:[id number] DOB: 06-Dec-1980 DOA: 10/03/2018 PCP: [facility], [facility] (Inactive)   Brief Narrative:  Please see H&P from earlier this morning for complete HPI.  Admitted for back and flank pain in the setting of previous kidney biopsy at [facility] just last Friday 1 week ago.  Patient tolerated procedure well, no issues discharged over the weekend but noted worsening pain general fatigue since that time.  Patient admitted for symptomatic anemia with hemoglobin at 6.8, follow a.m. H&H, consider discussion with IR for possible embolization if bleeding ongoing.  We did discuss possible need for transfer back to [facility] as well should patient's status worsen although patient at this time wishes to remain here in our hospital which is certainly reasonable given her stable nature.  Patient states a Dr. Neta Ehlers" (unknown spelling) was the physician at [facility] who performed biopsy - if further information required.  *Addendum* patient had symptomatic tachycardia this afternoon - home nicardipine not restarted at admission - will continue now along with home labetolol. Will uptitrate medication as indicated.  Assessment & Plan:   Principal Problem:   Symptomatic anemia Active Problems:   Acute on chronic blood loss anemia   Lupus (HCC)   Hypertension   Chronic kidney disease   LOS: 0 days   Time spent: 43min  Latasha Drake C Adylynn Hertenstein, DO [facility]  If 7PM-7AM, please contact night-coverage [email or web address] Password [facility] 10/04/2018, 3:34 PM

## 2018-10-04 NOTE — Progress Notes (Signed)
  Echocardiogram 2D Echocardiogram has been performed.  Latasha Drake 10/04/2018, 9:59 AM

## 2018-10-04 NOTE — ED Notes (Signed)
Patient transported to x-ray. ?

## 2018-10-04 NOTE — ED Notes (Signed)
Pt refused Tylenol to relieve her pain saying it does not work for her.

## 2018-10-04 NOTE — H&P (Addendum)
History and Physical    Latasha Drake XBJ:[id number] DOB: 11/06/1980 DOA: 10/03/2018  PCP: [facility] (Inactive)  Patient coming from: Home  Chief Complaint: back and flank pain, SOB  HPI: Latasha Drake is a 38 y.o. female with medical history significant of Lupus, class IV lupus nephritis, HTN who presented flank pain, SOB and dizziness.  She was recently admitted at [facility] for a kidney biopsy after which she developed a perinephric hematoma.  She underwent an IR procedure to embolize the bleeding vessel.  Her Hgb was stable at 8.1 on discharge.  However, on admission today her Hgb is now down to 6.8.  She had a CT scan of the abdomen which showed the perinephric hematoma, unclear if this had expanded since last admission as she did not have imaging at that time.  On the Ct of the abdomen, it was also noted that she had developed a pericardial effusion.  The ED team did a quick bedside ultrasound which did not show heart strain.  However, at the time of admission the patient also reported LLE swelling and D dimer was elevated, so there was concern for clotting.  Given her CKD, she could not have a CTA of the chest, so she will need to have a VQ scan.  She denied chest pain or pain with taking a deep breath.   The patient has requested to stay here for further work up.  I discussed with her that the best option would be to return to [facility] since all of her providers are there.  She notes that she would prefer to stay here, as it is closer to home and she feels that this hospital can do the same things [facility] can do.  She is adamant about staying at [facility]  ED Course: In the ED, she was evaluated and 1 Unit of PRBC was ordered for her.  Her BUN was 41, Cr 3.38.  Her Hgb was 6.8 with a normal platelet count.  The anemia is microcytic.  EKG not done, will order  Review of Systems: As per HPI otherwise 10 point review of systems negative.    Past Medical History:  Diagnosis Date     Anemia    Chronic kidney disease    Hypertension    Lupus ([place])     Past Surgical History:  Procedure Laterality Date   CESAREAN SECTION     HERNIA REPAIR     Reviewed  reports that she has been smoking cigarettes. She has been smoking about 0.50 packs per day. She has never used smokeless tobacco. She reports current alcohol use. She reports that she does not use drugs.  Allergies  Allergen Reactions   Lisinopril Swelling    Family History  Problem Relation Age of Onset   Lupus Other        cousin    Prior to Admission medications   Medication Sig Start Date End Date Taking? Authorizing Provider  citalopram (CELEXA) 40 MG tablet Take 40 mg by mouth daily.  10/27/16 10/04/18 Yes [provider]  labetalol (NORMODYNE) 100 MG tablet Take 100 mg by mouth 2 (two) times daily.  11/17/16  Yes [provider]  NIFEdipine (PROCARDIA-XL/ADALAT CC) 60 MG 24 hr tablet Take 60 mg by mouth daily. 12/03/16  Yes [provider]  pantoprazole (PROTONIX) 40 MG tablet Take 40 mg by mouth daily as needed (indigestion).  10/27/16  Yes [provider]  paragard intrauterine copper IUD IUD 1 Intra  Uterine Device by Intrauterine route once.   Yes [provider]         Vitamin D, Ergocalciferol, (DRISDOL) 50000 units CAPS capsule Take 50,000 Units by mouth every Sunday. Take on Sun. 06/23/15  Yes [provider]           Physical Exam: Constitutional: Young woman, no acute distress Vitals:   10/04/18 0330 10/04/18 0345 10/04/18 0400 10/04/18 0430  BP: 120/76 111/77 118/84 122/84  Pulse: 78 80 80 85  Resp:      Temp:      TempSrc:      SpO2: 100% 99% 99% 96%   Eyes:  lids and conjunctivae normal ENMT: Mucous membranes are dry, neck is supple, CTAB Respiratory: CTAB, no wheezing Cardiovascular: RR, NR, no murmur or rub, she does not have any pitting edema.  Left leg with mild ttp over ankle and anterior foot, no apparent swelling  on my exam Abdomen: No ttp over anterior abdomen.  She has left flank and CVA tenderness.  She has a point indentation where biopsy was done.  ND, soft, +BS Musculoskeletal: no clubbing / cyanosis. Normal muscle tone.  Skin: no rashes, lesions, ulcers on exposed skin Neurologic: Grossly intact, moving easily in bed, speech normal.  Psychiatric: Normal judgment and insight. Alert and oriented. Normal mood.    Labs on Admission: I have personally reviewed following labs and imaging studies  CBC: Recent Labs  Lab 10/03/18 2310  WBC 9.1  HGB 6.8*  HCT 20.7*  MCV 69.9*  PLT 998   Basic Metabolic Panel: Recent Labs  Lab 10/03/18 2310  NA 135  K 4.0  CL 103  CO2 20*  GLUCOSE 116*  BUN 41*  CREATININE 3.38*  CALCIUM 9.1   GFR: CrCl cannot be calculated (Unknown ideal weight.). Liver Function Tests: Recent Labs  Lab 10/03/18 2310  AST 19  ALT 18  ALKPHOS 83  BILITOT 0.3  PROT 6.8  ALBUMIN 3.0*   Recent Labs  Lab 10/03/18 2310  LIPASE 32   No results for input(s): AMMONIA in the last 168 hours. Coagulation Profile: No results for input(s): INR, PROTIME in the last 168 hours. Cardiac Enzymes: No results for input(s): CKTOTAL, CKMB, CKMBINDEX, TROPONINI in the last 168 hours. BNP (last 3 results) No results for input(s): PROBNP in the last 8760 hours. HbA1C: No results for input(s): HGBA1C in the last 72 hours. CBG: No results for input(s): GLUCAP in the last 168 hours. Lipid Profile: No results for input(s): CHOL, HDL, LDLCALC, TRIG, CHOLHDL, LDLDIRECT in the last 72 hours. Thyroid Function Tests: No results for input(s): TSH, T4TOTAL, FREET4, T3FREE, THYROIDAB in the last 72 hours. Anemia Panel: No results for input(s): VITAMINB12, FOLATE, FERRITIN, TIBC, IRON, RETICCTPCT in the last 72 hours. Urine analysis:    Component Value Date/Time   COLORURINE YELLOW 10/03/2018 2332   APPEARANCEUR HAZY (A) 10/03/2018 2332   LABSPEC 1.014 10/03/2018 2332   PHURINE  5.0 10/03/2018 2332   GLUCOSEU NEGATIVE 10/03/2018 2332   HGBUR SMALL (A) 10/03/2018 2332   BILIRUBINUR NEGATIVE 10/03/2018 2332   KETONESUR NEGATIVE 10/03/2018 2332   PROTEINUR 100 (A) 10/03/2018 2332   NITRITE NEGATIVE 10/03/2018 2332   LEUKOCYTESUR NEGATIVE 10/03/2018 2332    Radiological Exams on Admission: Ct Abdomen Pelvis Wo Contrast  Result Date: 10/04/2018 CLINICAL DATA:  Worsening left CVAT. Complicated left renal biopsy 6 days ago requiring embolization. EXAM: CT ABDOMEN AND PELVIS WITHOUT CONTRAST TECHNIQUE: Multidetector CT imaging of the abdomen and pelvis  was performed following the standard protocol without IV contrast. COMPARISON:  Renal ultrasound and angiogram 09/28/2018 FINDINGS: Lower chest: Calcification along the posterior heart which is likely pericardial or epicardial. There is a small to moderate inferior pericardial effusion in this patient with history of lupus. Trace left pleural effusion. Atelectasis in the lower lungs. Hepatobiliary: No focal liver abnormality.No evidence of biliary obstruction or stone. Pancreas: Unremarkable. Spleen: Unremarkable. Adrenals/Urinary Tract:  Negative adrenals. Left perinephric hematoma in both the perirenal and posterior pararenal spaces. The hematoma along the lower pole biopsy site measures up to 4.4 cm on axial slices. The hematoma following the upper psoas measures up to 6 x 4 cm on axial slices. Associated soft tissue gas correlates with history of percutaneous biopsy. Embolization coils in similar position. No notable deformity of the renal capsule. No hydronephrosis. Unremarkable bladder. Stomach/Bowel: No obstruction. No evidence of bowel inflammation. Coarse fat calcification in the left lower quadrant likely from old epiploic appendagitis. Vascular/Lymphatic: No mass or adenopathy. No acute vascular finding. Mild atherosclerotic calcification. Reproductive:Tampon present. IUD which is eccentric to the uterus which is lobulated and  likely contains fibroids. Other: No ascites or pneumoperitoneum. Musculoskeletal: No acute abnormalities. IMPRESSION: 1. Left perinephric hematoma that is known from renal ultrasound 09/28/2018. No comparison CT to determine if the hematoma has grown since 09/28/2018 biopsy. No hydronephrosis or renal parenchymal distortion. 2. Small to moderate pericardial effusion with an area of nodular calcification posteriorly. Trace left pleural effusion. Patient has history of lupus. 3. Malpositioned IUD.  Probable uterine fibroids. Electronically Signed   By: Monte Fantasia M.D.   On: 10/04/2018 05:06    EKG: ordered  Assessment/Plan  Acute on chronic blood loss anemia Perinephric hematoma - s/p renal biopsy about 6 days ago - PRBC, 1 unit ordered in the ED - Follow up post transfusion H/H - Trend CBC - If continues to drop, consider IR consult for re-embolization  SOB, dizziness Pericardial effusion LE swelling Elevated D dimer - Multiple causes possible.  DDx includes acute anemia vs. Pericarditis vs. Pulmonary embolism vs. A combination of all three or another diagnosis - CXR to evaluate for any acute pulmonary findings - TTE to evaluate effusion more thoroughly - EKG for possibility of pericarditis given h/o lupus (no rub on my exam) - LE dopplers, hold anticoagulation given hematoma and very scant swelling and pain on exam, not consistent with DVT - VQ scan given SOB, dizziness and elevated D dimer    Lupus - Currently not treated with any disease modifying agents - By review of [facility] chart, patient due to have OP rheumatology follow up    Hypertension - Previously on lisinopril - angioedema - Now on nifedipine and labetalol - BP has been stable, given report of swelling, will hold nifedipine - Continue labetalol    Stage IV Lupus nephritis AKI on Chronic kidney disease - Cr was around 2.9 on discharge, now 3.3.  Likely within range for her - IVF at a slow rate was given today -  Would consider renal consult today if Cr continues to worsen for further treatment of lupus nephritis - Trend Cr - I/Os  I strongly advised patient to be transferred to [facility], however she preferred to stay here and be treated as this was closer to home.    DVT prophylaxis: SCDs due to concern for bleeding Code Status: Full  Disposition Plan: Admit for further work up [employer] called: Will need nephrology and maybe IR  Admission status: Progressive, INP  Gilles Chiquito MD [facility]  If 7PM-7AM, please contact night-coverage [email or web address] Password [facility]  10/04/2018, 6:44 AM

## 2018-10-04 NOTE — ED Notes (Signed)
Pt given turkey sandwich and apple juice

## 2018-10-04 NOTE — ED Notes (Signed)
Pt noted to been in an arrhythmia at 1634.Pt reported to have sharp, left sided chest pain and a fluttering sensation in her chest at the time. Cardiac strip printed and added to medical records. Attending MD informed.

## 2018-10-04 NOTE — Progress Notes (Signed)
Lower extremity venous has been completed.   Preliminary results in CV Proc.   Abram Sander 10/04/2018 9:45 AM

## 2018-10-04 NOTE — ED Provider Notes (Signed)
[facility] EMERGENCY DEPARTMENT Provider Note   CSN: [id number] Arrival date & time: 10/03/18  2249    History   Chief Complaint Chief Complaint  Patient presents with   Abdominal Pain    HPI Latasha Drake is a 38 y.o. female with a history of lupus, hypertension, and chronic kidney disease who presents to the emergency department with a chief complaint of flank pain.  The patient reports that she has been having bilateral flank and upper abdominal pain for the last few days.  She reports that the pain, particularly on the left, significantly worsened earlier today.  She has been taking Tylenol at home with minimal improvement.    She reports that earlier tonight she was taking a shower when she became very dizzy and lightheaded and felt as if she might pass out so she came to the ER for evaluation.  She reports she is also been having some shortness of breath and fatigue. She also notes that her left lower leg has been more swollen than the right, but is nontender.  She reports that she has been having chills, but no fever.  She also endorses nausea, but no vomiting or diarrhea.  No numbness or weakness, headaches, visual changes, chest pain, palpitations.  She was admitted at [facility] on July 24 and underwent a kidney biopsy on the left.  Unfortunately, she developed an enlarging perinephric hematoma that required IR intervention for left renal angiography and coil embolization of an acutely bleeding branch artery.  She was discharged on July 25 with a creatinine of 2.93 and hemoglobin of 8.1.  She has an IUD for contraception.  No history of VTE.  She reports that she vapes tobacco.  She denies NSAID use. No marijuana or other recreational drug use. She reports that she was taking Ambien, but was taken off the medication.  She reports that since she was taken off Ambien that she has been taking Tylenol PM every night for months.     The history is  provided by the patient. No language interpreter was used.    Past Medical History:  Diagnosis Date   Anemia    Chronic kidney disease    Hypertension    Lupus ([place])     There are no active problems to display for this patient.   Past Surgical History:  Procedure Laterality Date   CESAREAN SECTION     HERNIA REPAIR       OB History   No obstetric history on file.      Home Medications    Prior to Admission medications   Medication Sig Start Date End Date Taking? Authorizing Provider  citalopram (CELEXA) 40 MG tablet Take 40 mg by mouth daily.  10/27/16 10/04/18 Yes [provider]  labetalol (NORMODYNE) 100 MG tablet Take 100 mg by mouth 2 (two) times daily.  11/17/16  Yes [provider]  NIFEdipine (PROCARDIA-XL/ADALAT CC) 60 MG 24 hr tablet Take 60 mg by mouth daily. 12/03/16  Yes [provider]  pantoprazole (PROTONIX) 40 MG tablet Take 40 mg by mouth daily as needed (indigestion).  10/27/16  Yes [provider]  paragard intrauterine copper IUD IUD 1 Intra Uterine Device by Intrauterine route once.   Yes [provider]  sucralfate (CARAFATE) 1 GM/10ML suspension Take 1 g by mouth daily as needed (indigestion).  10/27/16  Yes [provider]  Vitamin D, Ergocalciferol, (DRISDOL) 50000 units CAPS capsule Take 50,000 Units by mouth every  Sunday. Take on Sun. 06/23/15  Yes [provider]  ondansetron (ZOFRAN) 4 MG tablet Take 1 tablet (4 mg total) by mouth every 8 (eight) hours as needed for nausea or vomiting. Patient not taking: Reported on 12/30/2016 05/28/15   Macarthur Critchley, MD    Family History No family history on file.  Social History Social History   Tobacco Use   Smoking status: Current Every Day Smoker    Packs/day: 0.50    Types: Cigarettes   Smokeless tobacco: Never Used  Substance Use Topics   Alcohol use: Yes    Comment: occassionally   Drug use: No     Allergies     Lisinopril   Review of Systems Review of Systems  Constitutional: Positive for chills. Negative for activity change and fever.  HENT: Negative for congestion and sore throat.   Eyes: Negative for visual disturbance.  Respiratory: Positive for shortness of breath. Negative for cough, choking and wheezing.   Cardiovascular: Positive for leg swelling. Negative for chest pain and palpitations.  Gastrointestinal: Positive for nausea. Negative for abdominal pain, blood in stool, diarrhea and vomiting.  Genitourinary: Positive for flank pain. Negative for dysuria.  Musculoskeletal: Positive for myalgias. Negative for arthralgias, back pain, gait problem, joint swelling, neck pain and neck stiffness.  Skin: Negative for rash and wound.  Allergic/Immunologic: Negative for immunocompromised state.  Neurological: Positive for light-headedness. Negative for dizziness, syncope, weakness and headaches.  Psychiatric/Behavioral: Negative for confusion.     Physical Exam Updated Vital Signs BP 122/84    Pulse 85    Temp 98.2 F (36.8 C) (Oral)    Resp 16    SpO2 96%   Physical Exam Vitals signs and nursing note reviewed.  Constitutional:      General: She is not in acute distress.    Appearance: She is not ill-appearing, toxic-appearing or diaphoretic.  HENT:     Head: Normocephalic.  Eyes:     Conjunctiva/sclera: Conjunctivae normal.  Neck:     Musculoskeletal: Normal range of motion and neck supple.  Cardiovascular:     Rate and Rhythm: Normal rate and regular rhythm.     Heart sounds: No murmur. No friction rub. No gallop.   Pulmonary:     Effort: Pulmonary effort is normal. No respiratory distress.     Breath sounds: Normal breath sounds. No stridor. No wheezing, rhonchi or rales.  Chest:     Chest wall: No tenderness.  Abdominal:     General: There is no distension.     Palpations: Abdomen is soft. There is no mass.     Tenderness: There is abdominal tenderness. There is left CVA  tenderness. There is no right CVA tenderness, guarding or rebound.     Hernia: No hernia is present.     Comments: Significantly tender to palpation over the left CVA and left flank area.  She has minimal tenderness with right CVA palpation.  Tender to palpation over the left lower quadrant and suprapubic region.  Abdomen is soft, nondistended.  No rebound.  She has voluntary guarding with palpation of the left lower quadrant.  Musculoskeletal:        General: Swelling present. No tenderness, deformity or signs of injury.     Right lower leg: No edema.     Left lower leg: Edema present.     Comments: Swelling of the left calf as compared to the right.  No tenderness with palpation.  Negative Homans sign.  DP pulses are  2+ and symmetric.  Sensation is intact and she has good strength against resistance.  No tenderness of the left ankle or knee.  Skin:    General: Skin is warm.     Findings: No rash.  Neurological:     Mental Status: She is alert.  Psychiatric:        Behavior: Behavior normal.      ED Treatments / Results  Labs (all labs ordered are listed, but only abnormal results are displayed) Labs Reviewed  COMPREHENSIVE METABOLIC PANEL - Abnormal; Notable for the following components:      Result Value   CO2 20 (*)    Glucose, Bld 116 (*)    BUN 41 (*)    Creatinine, Ser 3.38 (*)    Albumin 3.0 (*)    GFR calc non Af Amer 16 (*)    GFR calc Af Amer 19 (*)    All other components within normal limits  CBC - Abnormal; Notable for the following components:   RBC 2.96 (*)    Hemoglobin 6.8 (*)    HCT 20.7 (*)    MCV 69.9 (*)    MCH 23.0 (*)    RDW 16.9 (*)    All other components within normal limits  URINALYSIS, ROUTINE W REFLEX MICROSCOPIC - Abnormal; Notable for the following components:   APPearance HAZY (*)    Hgb urine dipstick SMALL (*)    Protein, ur 100 (*)    All other components within normal limits  D-DIMER, QUANTITATIVE (NOT AT [facility]) - Abnormal; Notable for  the following components:   D-Dimer, Quant 6.08 (*)    All other components within normal limits  SARS CORONAVIRUS 2 (HOSPITAL ORDER, [place] [facility])  LIPASE, BLOOD  I-STAT BETA HCG BLOOD, ED ([facility], [facility], [facility] ONLY)  TYPE AND SCREEN  ABO/RH  PREPARE RBC (CROSSMATCH)    EKG None  Radiology Ct Abdomen Pelvis Wo Contrast  Result Date: 10/04/2018 CLINICAL DATA:  Worsening left CVAT. Complicated left renal biopsy 6 days ago requiring embolization. EXAM: CT ABDOMEN AND PELVIS WITHOUT CONTRAST TECHNIQUE: Multidetector CT imaging of the abdomen and pelvis was performed following the standard protocol without IV contrast. COMPARISON:  Renal ultrasound and angiogram 09/28/2018 FINDINGS: Lower chest: Calcification along the posterior heart which is likely pericardial or epicardial. There is a small to moderate inferior pericardial effusion in this patient with history of lupus. Trace left pleural effusion. Atelectasis in the lower lungs. Hepatobiliary: No focal liver abnormality.No evidence of biliary obstruction or stone. Pancreas: Unremarkable. Spleen: Unremarkable. Adrenals/Urinary Tract:  Negative adrenals. Left perinephric hematoma in both the perirenal and posterior pararenal spaces. The hematoma along the lower pole biopsy site measures up to 4.4 cm on axial slices. The hematoma following the upper psoas measures up to 6 x 4 cm on axial slices. Associated soft tissue gas correlates with history of percutaneous biopsy. Embolization coils in similar position. No notable deformity of the renal capsule. No hydronephrosis. Unremarkable bladder. Stomach/Bowel: No obstruction. No evidence of bowel inflammation. Coarse fat calcification in the left lower quadrant likely from old epiploic appendagitis. Vascular/Lymphatic: No mass or adenopathy. No acute vascular finding. Mild atherosclerotic calcification. Reproductive:Tampon present. IUD which is eccentric to the uterus which is lobulated and  likely contains fibroids. Other: No ascites or pneumoperitoneum. Musculoskeletal: No acute abnormalities. IMPRESSION: 1. Left perinephric hematoma that is known from renal ultrasound 09/28/2018. No comparison CT to determine if the hematoma has grown since 09/28/2018 biopsy. No hydronephrosis or  renal parenchymal distortion. 2. Small to moderate pericardial effusion with an area of nodular calcification posteriorly. Trace left pleural effusion. Patient has history of lupus. 3. Malpositioned IUD.  Probable uterine fibroids. Electronically Signed   By: Monte Fantasia M.D.   On: 10/04/2018 05:06    Procedures .Critical Care Performed by: Joanne Gavel, PA-C Authorized by: Joanne Gavel, PA-C   Critical care provider statement:    Critical care time (minutes):  35   Critical care time was exclusive of:  Separately billable procedures and treating other patients and teaching time   Critical care was necessary to treat or prevent imminent or life-threatening deterioration of the following conditions:  Circulatory failure   Critical care was time spent personally by me on the following activities:  Ordering and performing treatments and interventions, ordering and review of laboratory studies, ordering and review of radiographic studies, re-evaluation of patient's condition, review of old charts, development of treatment plan with patient or surrogate, obtaining history from patient or surrogate, examination of patient and evaluation of patient's response to treatment   (including critical care time)  Medications Ordered in ED Medications  sodium chloride flush (NS) 0.9 % injection 3 mL (3 mLs Intravenous Given 10/04/18 0422)  ondansetron (ZOFRAN) injection 4 mg (4 mg Intravenous Given 10/04/18 0421)  morphine 4 MG/ML injection 4 mg (4 mg Intravenous Given 10/04/18 0422)  0.9 %  sodium chloride infusion (10 mL/hr Intravenous New Bag/Given 10/04/18 0438)     Initial Impression / Assessment and Plan  / ED Course  I have reviewed the triage vital signs and the nursing notes.  Pertinent labs & imaging results that were available during my care of the patient were reviewed by me and considered in my medical decision making (see chart for details).        38 year old female with a history of lupus, hypertension, and chronic kidney disease presenting with bilateral flank pain, left significantly greater than right.  She underwent renal biopsy on July 24 at [facility] and developed a left perinephric hematoma that required IR intervention with coil liberalization of an acutely bleeding branch artery.  She reports near syncope and lightheadedness earlier tonight while taking a shower and has been having progressively worsening shortness of breath and also notes some left leg swelling.  1. Creatinine is 3.38, up from 2.93 at discharge from [facility] on July 25.  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  2.  Patient was discharged from [facility] with hemoglobin of 8. Hemoglobin today is 6.8.  Given worsening left-sided flank pain, concern was for recurrent hematoma of the left perinephric area.  1 unit of packed RBCs given in the ER.  CT with left perinephric hematoma measuring up to 4.4 cm on axial sites along the left lower biopsy site the hematoma follows the upper so as it measures up to 6 x 4 cm on axial slices.  I suspect her worsening pain is due to possibly worsening perinephric hematoma.  Unfortunately, CT imaging from [facility] is not available for comparison of size of hematoma.  3.  CT also demonstrates a small to moderate pericardial effusion with a trace left pleural effusion.  Bedside ultrasound performed by Dr. Leonides Schanz, please see her procedure note, does not demonstrate tamponade physiology.  This is likely secondary to lupus.  However, the patient has been endorsing worsening shortness  of breath.  She also notes that  she has had left leg swelling.  Given her history of lupus, she is at increased risk for VTE.  D-dimer was ordered since the patient is unable to go CT angios for PE study.  D-dimer was elevated at 6.08.  This could be in part due to lupus, but given her increased risk, she will likely require a V/Q study.  DVT study not ordered as this procedure is not available in the ER overnight.  Shared decision making conversation with the patient's regarding transfer to [facility] since her nephrologist and IR team previously saw the patient there.  She would like to stay at [facility] since she lives in the area.  COVID-19 test has been ordered and is pending.  On re-evaluation, she is feeling much better after morphine and Zofran for pain and nausea.  Consult to the hospitalist team and spoke with Dr. Daryll Drown who will accept the patient for admission. The patient appears reasonably stabilized for admission considering the current resources, flow, and capabilities available in the ED at this time, and I doubt any other [facility] requiring further screening and/or treatment in the ED prior to admission.   Final Clinical Impressions(s) / ED Diagnoses   Final diagnoses:  Traumatic perinephric hematoma of left kidney, initial encounter  Elevated d-dimer  Left leg swelling  Acute renal failure superimposed on chronic kidney disease, unspecified CKD stage, unspecified acute renal failure type [facility])  Pericardial effusion    ED Discharge Orders    None       Kamaljit Hizer A, PA-C 10/04/18 0630    Ward, Delice Bison, DO 10/04/18 [id number]

## 2018-10-04 NOTE — ED Notes (Signed)
ED TO INPATIENT HANDOFF REPORT  ED Nurse Name and Phone #:  Patty [phone]  S Name/Age/Gender Latasha Drake 38 y.o. female Room/Bed: 054C/054C  Code Status   Code Status: Full Code  Home/SNF/Other Home Patient oriented to: self, place, time and situation Is this baseline? Yes   Triage Complete: Triage complete  Chief Complaint left side pain/ weakness  Triage Note Pt reports left side abd pain and generalized weakness. Pt had a kidney biopsy done Friday. No bruising or swelling noted in the area. Pt denies n/v. GI/GU WNL.    Allergies Allergies  Allergen Reactions  . Lisinopril Swelling    Level of Care/Admitting Diagnosis ED Disposition    ED Disposition Condition [facility] Area: [place] [[id number]]  Level of Care: Progressive [102]  Covid Evaluation: Asymptomatic Screening Protocol (No Symptoms)  Diagnosis: Acute on chronic blood loss anemia [[id number]]  Admitting Physician: Cresenciano Lick  Attending Physician: Cresenciano Lick  Estimated length of stay: 3 - 4 days  Certification:: I certify this patient will need inpatient services for at least 2 midnights  PT Class (Do Not Modify): Inpatient [101]  PT Acc Code (Do Not Modify): Private [1]       B Medical/Surgery History Past Medical History:  Diagnosis Date  . Anemia   . Chronic kidney disease   . Hypertension   . Lupus [facility])    Past Surgical History:  Procedure Laterality Date  . CESAREAN SECTION    . HERNIA REPAIR       A IV Location/Drains/Wounds Patient Lines/Drains/Airways Status   Active Line/Drains/Airways    Name:   Placement date:   Placement time:   Site:   Days:   Peripheral IV 10/04/18 Right Antecubital   10/04/18    0438    Antecubital   less than 1   Peripheral IV 10/04/18 Left Forearm   10/04/18    0719    Forearm   less than 1          Intake/Output Last 24 hours  Intake/Output Summary (Last 24 hours) at 10/04/2018 1550 Last  data filed at 10/04/2018 1030 Gross per 24 hour  Intake 1315 ml  Output -  Net 1315 ml    Labs/Imaging Results for orders placed or performed during the hospital encounter of 10/03/18 (from the past 48 hour(s))  Lipase, blood     Status: None   Collection Time: 10/03/18 11:10 PM  Result Value Ref Range   Lipase 32 11 - 51 U/L    Comment: Performed at [facility], [place] [address], [place],  [address]  Comprehensive metabolic panel     Status: Abnormal   Collection Time: 10/03/18 11:10 PM  Result Value Ref Range   Sodium 135 135 - 145 mmol/L   Potassium 4.0 3.5 - 5.1 mmol/L   Chloride 103 98 - 111 mmol/L   CO2 20 (L) 22 - 32 mmol/L   Glucose, Bld 116 (H) 70 - 99 mg/dL   BUN 41 (H) 6 - 20 mg/dL   Creatinine, Ser 3.38 (H) 0.44 - 1.00 mg/dL   Calcium 9.1 8.9 - 10.3 mg/dL   Total Protein 6.8 6.5 - 8.1 g/dL   Albumin 3.0 (L) 3.5 - 5.0 g/dL   AST 19 15 - 41 U/L   ALT 18 0 - 44 U/L   Alkaline Phosphatase 83 38 - 126 U/L   Total Bilirubin 0.3 0.3 - 1.2 mg/dL  GFR calc non Af Amer 16 (L) >60 mL/min   GFR calc Af Amer 19 (L) >60 mL/min   Anion gap 12 5 - 15    Comment: Performed at [facility] [address] [place], [place] [address]  CBC     Status: Abnormal   Collection Time: 10/03/18 11:10 PM  Result Value Ref Range   WBC 9.1 4.0 - 10.5 K/uL   RBC 2.96 (L) 3.87 - 5.11 MIL/uL   Hemoglobin 6.8 (LL) 12.0 - 15.0 g/dL    Comment: REPEATED TO VERIFY Reticulocyte Hemoglobin testing may be clinically indicated, consider ordering this additional test [id number] THIS CRITICAL RESULT HAS VERIFIED AND BEEN CALLED TO C. CHRISCO,RN BY TERRAN TYSOR ON 07 30 2020 AT 0007, AND HAS BEEN READ BACK.     HCT 20.7 (L) 36.0 - 46.0 %   MCV 69.9 (L) 80.0 - 100.0 fL   MCH 23.0 (L) 26.0 - 34.0 pg   MCHC 32.9 30.0 - 36.0 g/dL   RDW 16.9 (H) 11.5 - 15.5 %   Platelets 334 150 - 400 K/uL    Comment: REPEATED TO VERIFY   nRBC 0.0 0.0 - 0.2 %    Comment: Performed at [facility] [address], [place], [place] [address]  Urinalysis, Routine w reflex microscopic     Status: Abnormal   Collection Time: 10/03/18 11:32 PM  Result Value Ref Range   Color, Urine YELLOW YELLOW   APPearance HAZY (A) CLEAR   Specific Gravity, Urine 1.014 1.005 - 1.030   pH 5.0 5.0 - 8.0   Glucose, UA NEGATIVE NEGATIVE mg/dL   Hgb urine dipstick SMALL (A) NEGATIVE   Bilirubin Urine NEGATIVE NEGATIVE   Ketones, ur NEGATIVE NEGATIVE mg/dL   Protein, ur 100 (A) NEGATIVE mg/dL   Nitrite NEGATIVE NEGATIVE   Leukocytes,Ua NEGATIVE NEGATIVE   RBC / HPF 0-5 0 - 5 RBC/hpf   WBC, UA 0-5 0 - 5 WBC/hpf   Bacteria, UA NONE SEEN NONE SEEN   Squamous Epithelial / LPF 0-5 0 - 5   Mucus PRESENT    Amorphous Crystal PRESENT     Comment: Performed at [facility], [place] [address], [place], [place] [address]  I-Stat beta hCG blood, ED     Status: None   Collection Time: 10/03/18 11:37 PM  Result Value Ref Range   I-stat hCG, quantitative <5.0 <5 mIU/mL   Comment 3            Comment:   GEST. AGE      CONC.  (mIU/mL)   <=1 WEEK        5 - 50     2 WEEKS       50 - 500     3 WEEKS       100 - 10,000     4 WEEKS     1,000 - 30,000        FEMALE AND NON-PREGNANT FEMALE:     LESS THAN 5 mIU/mL   D-dimer, quantitative (not at [facility])     Status: Abnormal   Collection Time: 10/04/18  4:10 AM  Result Value Ref Range   D-Dimer, Quant 6.08 (H) 0.00 - 0.50 ug/mL-FEU    Comment: (NOTE) At the manufacturer cut-off of 0.50 ug/mL FEU, this assay has been documented to exclude PE with a sensitivity and negative predictive value of 97 to 99%.  At this time, this assay has not been approved by  the FDA to exclude DVT/VTE. Results should be correlated with clinical presentation. Performed at [facility], [place] [address] [place], [place] [address]   Type and screen [place]     Status: None (Preliminary result)   Collection Time: 10/04/18  4:10 AM  Result Value Ref  Range   ABO/RH(D) O POS    Antibody Screen NEG    Sample Expiration 10/07/2018,2359    Unit Number [id number]    Blood Component Type RED CELLS,LR    Unit division 00    Status of Unit ISSUED    Transfusion Status OK TO TRANSFUSE    Crossmatch Result      Compatible Performed at [facility], [place] [address], [place], [place] [address]   ABO/Rh     Status: None   Collection Time: 10/04/18  4:10 AM  Result Value Ref Range   ABO/RH(D)      O POS Performed at [facility] [address] [place], [place] [address]   SARS Coronavirus 2 (CEPHEID - Performed in [facility] [place] [facility] hospital lab), Hosp Order     Status: None   Collection Time: 10/04/18  4:18 AM   Specimen: Nasopharyngeal Swab  Result Value Ref Range   SARS Coronavirus 2 NEGATIVE NEGATIVE    Comment: (NOTE) If result is NEGATIVE SARS-CoV-2 target nucleic acids are NOT DETECTED. The SARS-CoV-2 RNA is generally detectable in upper and lower  respiratory specimens during the acute phase of infection. The lowest  concentration of SARS-CoV-2 viral copies this assay can detect is 250  copies / mL. A negative result does not preclude SARS-CoV-2 infection  and should not be used as the sole basis for treatment or other  patient management decisions.  A negative result may occur with  improper specimen collection / handling, submission of specimen other  than nasopharyngeal swab, presence of viral mutation(s) within the  areas targeted by this assay, and inadequate number of viral copies  (<250 copies / mL). A negative result must be combined with clinical  observations, patient history, and epidemiological information. If result is POSITIVE SARS-CoV-2 target nucleic acids are DETECTED. The SARS-CoV-2 RNA is generally detectable in upper and lower  respiratory specimens dur ing the acute phase of infection.  Positive  results are indicative of active infection with SARS-CoV-2.  Clinical  correlation with patient  history and other diagnostic information is  necessary to determine patient infection status.  Positive results do  not rule out bacterial infection or co-infection with other viruses. If result is PRESUMPTIVE POSTIVE SARS-CoV-2 nucleic acids MAY BE PRESENT.   A presumptive positive result was obtained on the submitted specimen  and confirmed on repeat testing.  While 2019 novel coronavirus  (SARS-CoV-2) nucleic acids may be present in the submitted sample  additional confirmatory testing may be necessary for epidemiological  and / or clinical management purposes  to differentiate between  SARS-CoV-2 and other Sarbecovirus currently known to infect humans.  If clinically indicated additional testing with an alternate test  methodology [phone]) is advised. The SARS-CoV-2 RNA is generally  detectable in upper and lower respiratory sp ecimens during the acute  phase of infection. The expected result is Negative. Fact Sheet for Patients:  StrictlyIdeas.no Fact Sheet for Healthcare Providers: BankingDealers.co.za This test is not yet approved or cleared by the [place] FDA and has been authorized for detection and/or diagnosis of SARS-CoV-2 by FDA under an Emergency Use Authorization (EUA).  This EUA will  remain in effect (meaning this test can be used) for the duration of the COVID-19 declaration under Section 564(b)(1) of the Act, 21 U.S.C. section 360bbb-3(b)(1), unless the authorization is terminated or revoked sooner. Performed at [facility], [place] [address] [place], [place] [address]   Prepare RBC     Status: None   Collection Time: 10/04/18  5:40 AM  Result Value Ref Range   Order Confirmation      ORDER PROCESSED BY BLOOD BANK Performed at [facility], [place] [address] [place], [place] [address]   CBG monitoring, ED     Status: None   Collection Time: 10/04/18  8:52 AM  Result Value Ref Range    Glucose-Capillary 97 70 - 99 mg/dL  Hemoglobin and hematocrit, blood     Status: Abnormal   Collection Time: 10/04/18  2:00 PM  Result Value Ref Range   Hemoglobin 8.2 (L) 12.0 - 15.0 g/dL   HCT 24.6 (L) 36.0 - 46.0 %    Comment: Performed at [facility] [address], [place], [place] [address]   Ct Abdomen Pelvis Wo Contrast  Result Date: 10/04/2018 CLINICAL DATA:  Worsening left CVAT. Complicated left renal biopsy 6 days ago requiring embolization. EXAM: CT ABDOMEN AND PELVIS WITHOUT CONTRAST TECHNIQUE: Multidetector CT imaging of the abdomen and pelvis was performed following the standard protocol without IV contrast. COMPARISON:  Renal ultrasound and angiogram 09/28/2018 FINDINGS: Lower chest: Calcification along the posterior heart which is likely pericardial or epicardial. There is a small to moderate inferior pericardial effusion in this patient with history of lupus. Trace left pleural effusion. Atelectasis in the lower lungs. Hepatobiliary: No focal liver abnormality.No evidence of biliary obstruction or stone. Pancreas: Unremarkable. Spleen: Unremarkable. Adrenals/Urinary Tract:  Negative adrenals. Left perinephric hematoma in both the perirenal and posterior pararenal spaces. The hematoma along the lower pole biopsy site measures up to 4.4 cm on axial slices. The hematoma following the upper psoas measures up to 6 x 4 cm on axial slices. Associated soft tissue gas correlates with history of percutaneous biopsy. Embolization coils in similar position. No notable deformity of the renal capsule. No hydronephrosis. Unremarkable bladder. Stomach/Bowel: No obstruction. No evidence of bowel inflammation. Coarse fat calcification in the left lower quadrant likely from old epiploic appendagitis. Vascular/Lymphatic: No mass or adenopathy. No acute vascular finding. Mild atherosclerotic calcification. Reproductive:Tampon present. IUD which is eccentric to the uterus which is lobulated and likely  contains fibroids. Other: No ascites or pneumoperitoneum. Musculoskeletal: No acute abnormalities. IMPRESSION: 1. Left perinephric hematoma that is known from renal ultrasound 09/28/2018. No comparison CT to determine if the hematoma has grown since 09/28/2018 biopsy. No hydronephrosis or renal parenchymal distortion. 2. Small to moderate pericardial effusion with an area of nodular calcification posteriorly. Trace left pleural effusion. Patient has history of lupus. 3. Malpositioned IUD.  Probable uterine fibroids. Electronically Signed   By: Monte Fantasia M.D.   On: 10/04/2018 05:06   Dg Chest 2 View  Result Date: 10/04/2018 CLINICAL DATA:  38 year old female with abdominal pain and weakness. Recent renal biopsy. EXAM: CHEST - 2 VIEW COMPARISON:  05/28/2015 chest radiographs and earlier. CT Abdomen and Pelvis earlier today. FINDINGS: Lower lung volumes. Mild eventration of the diaphragm, normal variant. Stable cardiac size at the upper limits of normal. Other mediastinal contours are within normal limits. Visualized tracheal air column is within normal limits. Mild lung base atelectasis suspected. Small left pleural effusion S seen by CT today. No pneumothorax,  pulmonary edema or right pleural effusion. Left upper quadrant embolization coils. Negative visible bowel gas pattern. IMPRESSION: Low lung volumes with atelectasis and small left pleural effusion. Electronically Signed   By: Genevie Ann M.D.   On: 10/04/2018 12:23   Nm Pulmonary Perfusion  Result Date: 10/04/2018 CLINICAL DATA:  Shortness of breath EXAM: NUCLEAR MEDICINE PERFUSION LUNG SCAN TECHNIQUE: Perfusion images were obtained in multiple projections after intravenous injection of radiopharmaceutical. Ventilation scans intentionally deferred if perfusion scan and chest x-ray adequate for interpretation during COVID 19 epidemic. Views: Anterior, posterior, left lateral, right lateral, RPO, LPO, RAO, LAO RADIOPHARMACEUTICALS:  1.6 mCi Tc-63m MAA  IV COMPARISON:  Chest radiograph October 04, 2018 FINDINGS: Radiotracer uptake is homogeneous and symmetric bilaterally. No perfusion defects evident. IMPRESSION: No evident perfusion defects. Very low probability of pulmonary embolus. Electronically Signed   By: Lowella Grip III M.D.   On: 10/04/2018 13:16   Vas [place] Lower Extremity Venous (dvt)  Result Date: 10/04/2018  Lower Venous Study Indications: Swelling, and Edema.  Comparison Study: no prior Performing Technologist: Abram Sander RVS  Examination Guidelines: A complete evaluation includes B-mode imaging, spectral Doppler, color Doppler, and power Doppler as needed of all accessible portions of each vessel. Bilateral testing is considered an integral part of a complete examination. Limited examinations for reoccurring indications may be performed as noted.  +---------+---------------+---------+-----------+----------+-------+ RIGHT    CompressibilityPhasicitySpontaneityPropertiesSummary +---------+---------------+---------+-----------+----------+-------+ CFV      Full           Yes      Yes                          +---------+---------------+---------+-----------+----------+-------+ SFJ      Full                                                 +---------+---------------+---------+-----------+----------+-------+ FV Prox  Full                                                 +---------+---------------+---------+-----------+----------+-------+ FV Mid   Full                                                 +---------+---------------+---------+-----------+----------+-------+ FV DistalFull                                                 +---------+---------------+---------+-----------+----------+-------+ PFV      Full                                                 +---------+---------------+---------+-----------+----------+-------+ POP      Full           Yes      Yes                           +---------+---------------+---------+-----------+----------+-------+  PTV      Full                                                 +---------+---------------+---------+-----------+----------+-------+ PERO     Full                                                 +---------+---------------+---------+-----------+----------+-------+   +---------+---------------+---------+-----------+----------+-------+ LEFT     CompressibilityPhasicitySpontaneityPropertiesSummary +---------+---------------+---------+-----------+----------+-------+ CFV      Full           Yes      Yes                          +---------+---------------+---------+-----------+----------+-------+ SFJ      Full                                                 +---------+---------------+---------+-----------+----------+-------+ FV Prox  Full                                                 +---------+---------------+---------+-----------+----------+-------+ FV Mid   Full                                                 +---------+---------------+---------+-----------+----------+-------+ FV DistalFull                                                 +---------+---------------+---------+-----------+----------+-------+ PFV      Full                                                 +---------+---------------+---------+-----------+----------+-------+ POP      Full           Yes      Yes                          +---------+---------------+---------+-----------+----------+-------+ PTV      Full                                                 +---------+---------------+---------+-----------+----------+-------+ PERO     Full                                                 +---------+---------------+---------+-----------+----------+-------+  Summary: Right: There is no evidence of deep vein thrombosis in the lower extremity. No cystic structure found in the popliteal fossa. Left: There is  no evidence of deep vein thrombosis in the lower extremity. No cystic structure found in the popliteal fossa.  *See table(s) above for measurements and observations. Electronically signed by Monica Martinez MD on 10/04/2018 at 12:28:05 PM.    Final     Pending Labs Unresulted Labs (From admission, onward)    Start     Ordered   10/05/18 0500  Comprehensive metabolic panel  Tomorrow morning,   R     10/04/18 0716   10/05/18 0500  CBC  Tomorrow morning,   R     10/04/18 0716   10/04/18 0717  HIV antibody (Routine Testing)  Once,   STAT     10/04/18 0716          Vitals/Pain Today's Vitals   10/04/18 1305 10/04/18 1306 10/04/18 1400 10/04/18 1500  BP:  (!) 135/97 (!) 135/100 (!) 133/93  Pulse:   84 86  Resp:   (!) 27 (!) 27  Temp:      TempSrc:      SpO2:   97% 97%  PainSc: 10-Worst pain ever       Isolation Precautions No active isolations  Medications Medications  labetalol (NORMODYNE) tablet 100 mg (100 mg Oral Given 10/04/18 1306)  citalopram (CELEXA) tablet 40 mg (40 mg Oral Given 10/04/18 1305)  sodium chloride flush (NS) 0.9 % injection 3 mL (3 mLs Intravenous Given 10/04/18 1308)  acetaminophen (TYLENOL) tablet 650 mg (has no administration in time range)    Or  acetaminophen (TYLENOL) suppository 650 mg (has no administration in time range)  oxyCODONE (Oxy IR/ROXICODONE) immediate release tablet 5 mg (5 mg Oral Given 10/04/18 0901)  ondansetron (ZOFRAN) tablet 4 mg (has no administration in time range)    Or  ondansetron (ZOFRAN) injection 4 mg (has no administration in time range)  morphine 2 MG/ML injection 4 mg (4 mg Intravenous Given 10/04/18 1307)  sodium chloride flush (NS) 0.9 % injection 3 mL (3 mLs Intravenous Given 10/04/18 0422)  ondansetron (ZOFRAN) injection 4 mg (4 mg Intravenous Given 10/04/18 0421)  morphine 4 MG/ML injection 4 mg (4 mg Intravenous Given 10/04/18 0422)  0.9 %  sodium chloride infusion (0 mL/hr Intravenous Stopped 10/04/18 1123)   technetium albumin aggregated (MAA) injection solution 1.6 millicurie (1.6 millicuries Intravenous Contrast Given 10/04/18 1235)  pantoprazole (PROTONIX) EC tablet 40 mg (40 mg Oral Given 10/04/18 1433)    Mobility walks Low fall risk   Focused Assessments    R Recommendations: See Admitting Provider Note  Report given to:   Additional Notes:

## 2018-10-04 NOTE — ED Notes (Signed)
Patient transported to CT 

## 2018-10-05 LAB — TYPE AND SCREEN
ABO/RH(D): O POS
Antibody Screen: NEGATIVE
Unit division: 0

## 2018-10-05 LAB — COMPREHENSIVE METABOLIC PANEL
ALT: 14 U/L (ref 0–44)
AST: 12 U/L — ABNORMAL LOW (ref 15–41)
Albumin: 2.8 g/dL — ABNORMAL LOW (ref 3.5–5.0)
Alkaline Phosphatase: 77 U/L (ref 38–126)
Anion gap: 12 (ref 5–15)
BUN: 39 mg/dL — ABNORMAL HIGH (ref 6–20)
CO2: 20 mmol/L — ABNORMAL LOW (ref 22–32)
Calcium: 9.1 mg/dL (ref 8.9–10.3)
Chloride: 104 mmol/L (ref 98–111)
Creatinine, Ser: 3.26 mg/dL — ABNORMAL HIGH (ref 0.44–1.00)
GFR calc Af Amer: 20 mL/min — ABNORMAL LOW (ref >60)
GFR calc non Af Amer: 17 mL/min — ABNORMAL LOW (ref >60)
Glucose, Bld: 91 mg/dL (ref 70–99)
Potassium: 4 mmol/L (ref 3.5–5.1)
Sodium: 136 mmol/L (ref 135–145)
Total Bilirubin: 0.4 mg/dL (ref 0.3–1.2)
Total Protein: 6.7 g/dL (ref 6.5–8.1)

## 2018-10-05 LAB — CBC
HCT: 24.1 % — ABNORMAL LOW (ref 36.0–46.0)
Hemoglobin: 8.1 g/dL — ABNORMAL LOW (ref 12.0–15.0)
MCH: 23.6 pg — ABNORMAL LOW (ref 26.0–34.0)
MCHC: 33.6 g/dL (ref 30.0–36.0)
MCV: 70.3 fL — ABNORMAL LOW (ref 80.0–100.0)
Platelets: 340 10*3/uL (ref 150–400)
RBC: 3.43 MIL/uL — ABNORMAL LOW (ref 3.87–5.11)
RDW: 17.5 % — ABNORMAL HIGH (ref 11.5–15.5)
WBC: 7.4 10*3/uL (ref 4.0–10.5)
nRBC: 0 % (ref 0.0–0.2)

## 2018-10-05 LAB — BPAM RBC
Blood Product Expiration Date: 202008052359
ISSUE DATE / TIME: 202007300630
Unit Type and Rh: 5100

## 2018-10-05 LAB — HIV ANTIBODY (ROUTINE TESTING W REFLEX): HIV Screen 4th Generation wRfx: NONREACTIVE

## 2018-10-05 MED ORDER — HYDROCODONE-ACETAMINOPHEN 5-325 MG PO TABS: 1.0000 | ORAL_TABLET | ORAL | 0 refills | Status: AC | PRN

## 2018-10-05 NOTE — Plan of Care (Signed)
Pt discharged home via private vehicle, education complete, verbal understanding received from patient concerning all instructions.

## 2018-10-05 NOTE — Progress Notes (Signed)
Patient stating mid upper abdominal pain has radiated to her chest right side. The pain gets worse when she inhales. EKG done and given 4mg  of morphine. Will continue to monitor patient.

## 2018-10-05 NOTE — Discharge Summary (Signed)
Physician Discharge Summary  Marillyn Goren WUJ:[id number] DOB: [birth year] DOA: 10/03/2018  PCP: [facility], [facility] (Inactive)  Admit date: 10/03/2018 Discharge date: 10/05/2018  Admitted From: Home Disposition: Home  Recommendations for Outpatient Follow-up:  1. Follow up with PCP in 1-2 weeks, nephrology within 1 week 2. Please obtain BMP/CBC in one week  Discharge Condition: Stable CODE STATUS: Full Diet recommendation: As tolerated  Brief/Interim Summary: Latasha Drake is a 38 y.o. female with medical history significant of Lupus, class IV lupus nephritis, HTN who presented flank pain, SOB and dizziness.  She was recently admitted at [facility] for a kidney biopsy after which she developed a perinephric hematoma.  She underwent an IR procedure to embolize the bleeding vessel.  Her Hgb was stable at 8.1 on discharge.  However, on admission today her Hgb is now down to 6.8.  She had a CT scan of the abdomen which showed the perinephric hematoma, unclear if this had expanded since last admission as she did not have imaging at that time.  On the Ct of the abdomen, it was also noted that she had developed a pericardial effusion.  The ED team did a quick bedside ultrasound which did not show heart strain.  However, at the time of admission the patient also reported LLE swelling and D dimer was elevated, so there was concern for clotting. She denied chest pain or pain with taking a deep breath. The patient has requested to stay here for further work up.  At admission discussed with pt that the best option would be to return to [facility] since all of her providers are there.  She notes that she would prefer to stay here, as it is closer to home and she feels that this hospital can do the same things [facility] can do.  She is adamant about staying at [facility]. In the ED, she was evaluated and 1 Unit of PRBC was ordered for her.  Her BUN was 41, Cr 3.38.  Her Hgb was 6.8 with a normal platelet  count.  Patient admitted as above with essentially symptomatic anemia, likely postoperative, given treatment at [facility] for paranephric hematoma after kidney biopsy requiring embolization with IR.  She is anemia was as low as 6.8 here at admission, patient did receive 1 unit PRBC with hemoglobin now drastically improved to 8.1 appears to be around her baseline.  Unfortunately patient's d-dimer was elevated at intake requiring follow-up, VQ scan, DVT ultrasound for lower extremities were both negative for any findings.  Interestingly patient did have an echocardiogram also ordered with scant amount of fluid noted in the pericardial sac, this was not indicative of tamponade given its small nature and unaffected cardiac output per cardiology review. Patient's pain has been somewhat poorly controlled in the outpatient setting after procedure, will discharge as above on hydrocodone over the weekend to ensure adequate pain control.  Patient is to continue following for worsening pain signs or symptoms of bleeding or fatigue again with weakness chills or lightheadedness/dizziness.  At this time patient feels quite well, no further signs or symptoms of bleeding, pain well controlled, otherwise will need close follow-up with PCP and nephrology in the next week, requested patient receive outpatient labs in the next 3 to 5 days pending clinical availability to ensure no ongoing bleeding or anemia.  Discharge Diagnoses:  Principal Problem:   Symptomatic anemia Active Problems:   Acute on chronic blood loss anemia   Lupus (HCC)   Hypertension   Chronic kidney  disease  Discharge Instructions    Diet - low sodium heart healthy   Complete by: As directed    Increase activity slowly   Complete by: As directed      Allergies as of 10/05/2018      Reactions   Lisinopril Swelling      Medication List    TAKE these medications   citalopram 40 MG tablet Commonly known as: CELEXA Take 40 mg by mouth  daily.   HYDROcodone-acetaminophen 5-325 MG tablet Commonly known as: NORCO/VICODIN Take 1 tablet by mouth every 4 (four) hours as needed for up to 3 days for moderate pain.   labetalol 100 MG tablet Commonly known as: NORMODYNE Take 100 mg by mouth 2 (two) times daily.   NIFEdipine 60 MG 24 hr tablet Commonly known as: ADALAT CC Take 60 mg by mouth daily.   ondansetron 4 MG tablet Commonly known as: Zofran Take 1 tablet (4 mg total) by mouth every 8 (eight) hours as needed for nausea or vomiting.   pantoprazole 40 MG tablet Commonly known as: PROTONIX Take 40 mg by mouth daily as needed (indigestion).   paragard intrauterine copper Iud IUD 1 Intra Uterine Device by Intrauterine route once.   sucralfate 1 GM/10ML suspension Commonly known as: CARAFATE Take 1 g by mouth daily as needed (indigestion).   Vitamin D (Ergocalciferol) 1.25 MG (50000 UT) Caps capsule Commonly known as: DRISDOL Take 50,000 Units by mouth every Sunday. Take on Sun.       Allergies  Allergen Reactions  . Lisinopril Swelling    Consultations:  None   Procedures/Studies: Ct Abdomen Pelvis Wo Contrast  Result Date: 10/04/2018 CLINICAL DATA:  Worsening left CVAT. Complicated left renal biopsy 6 days ago requiring embolization. EXAM: CT ABDOMEN AND PELVIS WITHOUT CONTRAST TECHNIQUE: Multidetector CT imaging of the abdomen and pelvis was performed following the standard protocol without IV contrast. COMPARISON:  Renal ultrasound and angiogram 09/28/2018 FINDINGS: Lower chest: Calcification along the posterior heart which is likely pericardial or epicardial. There is a small to moderate inferior pericardial effusion in this patient with history of lupus. Trace left pleural effusion. Atelectasis in the lower lungs. Hepatobiliary: No focal liver abnormality.No evidence of biliary obstruction or stone. Pancreas: Unremarkable. Spleen: Unremarkable. Adrenals/Urinary Tract:  Negative adrenals. Left  perinephric hematoma in both the perirenal and posterior pararenal spaces. The hematoma along the lower pole biopsy site measures up to 4.4 cm on axial slices. The hematoma following the upper psoas measures up to 6 x 4 cm on axial slices. Associated soft tissue gas correlates with history of percutaneous biopsy. Embolization coils in similar position. No notable deformity of the renal capsule. No hydronephrosis. Unremarkable bladder. Stomach/Bowel: No obstruction. No evidence of bowel inflammation. Coarse fat calcification in the left lower quadrant likely from old epiploic appendagitis. Vascular/Lymphatic: No mass or adenopathy. No acute vascular finding. Mild atherosclerotic calcification. Reproductive:Tampon present. IUD which is eccentric to the uterus which is lobulated and likely contains fibroids. Other: No ascites or pneumoperitoneum. Musculoskeletal: No acute abnormalities. IMPRESSION: 1. Left perinephric hematoma that is known from renal ultrasound 09/28/2018. No comparison CT to determine if the hematoma has grown since 09/28/2018 biopsy. No hydronephrosis or renal parenchymal distortion. 2. Small to moderate pericardial effusion with an area of nodular calcification posteriorly. Trace left pleural effusion. Patient has history of lupus. 3. Malpositioned IUD.  Probable uterine fibroids. Electronically Signed   By: Monte Fantasia M.D.   On: 10/04/2018 05:06   Dg Chest 2 View  Result Date: 10/04/2018 CLINICAL DATA:  38 year old female with abdominal pain and weakness. Recent renal biopsy. EXAM: CHEST - 2 VIEW COMPARISON:  05/28/2015 chest radiographs and earlier. CT Abdomen and Pelvis earlier today. FINDINGS: Lower lung volumes. Mild eventration of the diaphragm, normal variant. Stable cardiac size at the upper limits of normal. Other mediastinal contours are within normal limits. Visualized tracheal air column is within normal limits. Mild lung base atelectasis suspected. Small left pleural effusion  S seen by CT today. No pneumothorax, pulmonary edema or right pleural effusion. Left upper quadrant embolization coils. Negative visible bowel gas pattern. IMPRESSION: Low lung volumes with atelectasis and small left pleural effusion. Electronically Signed   By: Genevie Ann M.D.   On: 10/04/2018 12:23   Nm Pulmonary Perfusion  Result Date: 10/04/2018 CLINICAL DATA:  Shortness of breath EXAM: NUCLEAR MEDICINE PERFUSION LUNG SCAN TECHNIQUE: Perfusion images were obtained in multiple projections after intravenous injection of radiopharmaceutical. Ventilation scans intentionally deferred if perfusion scan and chest x-ray adequate for interpretation during COVID 19 epidemic. Views: Anterior, posterior, left lateral, right lateral, RPO, LPO, RAO, LAO RADIOPHARMACEUTICALS:  1.6 mCi Tc-53m MAA IV COMPARISON:  Chest radiograph October 04, 2018 FINDINGS: Radiotracer uptake is homogeneous and symmetric bilaterally. No perfusion defects evident. IMPRESSION: No evident perfusion defects. Very low probability of pulmonary embolus. Electronically Signed   By: Lowella Grip III M.D.   On: 10/04/2018 13:16   Vas [place] Lower Extremity Venous (dvt)  Result Date: 10/04/2018  Lower Venous Study Indications: Swelling, and Edema.  Comparison Study: no prior Performing Technologist: Abram Sander RVS  Examination Guidelines: A complete evaluation includes B-mode imaging, spectral Doppler, color Doppler, and power Doppler as needed of all accessible portions of each vessel. Bilateral testing is considered an integral part of a complete examination. Limited examinations for reoccurring indications may be performed as noted.  +---------+---------------+---------+-----------+----------+-------+ RIGHT    CompressibilityPhasicitySpontaneityPropertiesSummary +---------+---------------+---------+-----------+----------+-------+ CFV      Full           Yes      Yes                           +---------+---------------+---------+-----------+----------+-------+ SFJ      Full                                                 +---------+---------------+---------+-----------+----------+-------+ FV Prox  Full                                                 +---------+---------------+---------+-----------+----------+-------+ FV Mid   Full                                                 +---------+---------------+---------+-----------+----------+-------+ FV DistalFull                                                 +---------+---------------+---------+-----------+----------+-------+ PFV      Full                                                 +---------+---------------+---------+-----------+----------+-------+  POP      Full           Yes      Yes                          +---------+---------------+---------+-----------+----------+-------+ PTV      Full                                                 +---------+---------------+---------+-----------+----------+-------+ PERO     Full                                                 +---------+---------------+---------+-----------+----------+-------+   +---------+---------------+---------+-----------+----------+-------+ LEFT     CompressibilityPhasicitySpontaneityPropertiesSummary +---------+---------------+---------+-----------+----------+-------+ CFV      Full           Yes      Yes                          +---------+---------------+---------+-----------+----------+-------+ SFJ      Full                                                 +---------+---------------+---------+-----------+----------+-------+ FV Prox  Full                                                 +---------+---------------+---------+-----------+----------+-------+ FV Mid   Full                                                 +---------+---------------+---------+-----------+----------+-------+ FV DistalFull                                                  +---------+---------------+---------+-----------+----------+-------+ PFV      Full                                                 +---------+---------------+---------+-----------+----------+-------+ POP      Full           Yes      Yes                          +---------+---------------+---------+-----------+----------+-------+ PTV      Full                                                 +---------+---------------+---------+-----------+----------+-------+ PERO  Full                                                 +---------+---------------+---------+-----------+----------+-------+     Summary: Right: There is no evidence of deep vein thrombosis in the lower extremity. No cystic structure found in the popliteal fossa. Left: There is no evidence of deep vein thrombosis in the lower extremity. No cystic structure found in the popliteal fossa.  *See table(s) above for measurements and observations. Electronically signed by Monica Martinez MD on 10/04/2018 at 12:28:05 PM.    Final      Subjective: No acute issues or events overnight, patient's abdominal pain somewhat radiating to the chest but appears pleuritic in nature.  Otherwise declines fevers, chills, nausea, vomiting, diarrhea, constipation, headache.   Discharge Exam: Vitals:   10/04/18 2319 10/05/18 0456  BP:  (!) 142/96  Pulse:  96  Resp:  19  Temp: 98.5 F (36.9 C) 98.7 F (37.1 C)  SpO2:  97%   Vitals:   10/04/18 2004 10/04/18 2223 10/04/18 2319 10/05/18 0456  BP: (!) 144/95   (!) 142/96  Pulse: 93   96  Resp: 18   19  Temp: (!) 100.8 F (38.2 C) 98.3 F (36.8 C) 98.5 F (36.9 C) 98.7 F (37.1 C)  TempSrc: Oral Oral Oral Oral  SpO2: 98%   97%  Weight:    68 kg  Height:        General:  Pleasantly resting in bed, No acute distress. HEENT:  Normocephalic atraumatic.  Sclerae nonicteric, noninjected.  Extraocular movements intact  bilaterally. Neck:  Without mass or deformity.  Trachea is midline. Lungs:  Clear to auscultate bilaterally without rhonchi, wheeze, or rales. Heart:  Regular rate and rhythm.  Without murmurs, rubs, or gallops. Abdomen:  Soft, nontender, nondistended.  Without guarding or rebound. Extremities: Without cyanosis, clubbing, edema, or obvious deformity. Vascular:  Dorsalis pedis and posterior tibial pulses palpable bilaterally. Skin:  Warm and dry, no erythema, no ulcerations.   The results of significant diagnostics from this hospitalization (including imaging, microbiology, ancillary and laboratory) are listed below for reference.     Microbiology: Recent Results (from the past 240 hour(s))  SARS Coronavirus 2 (CEPHEID - Performed in [place] hospital lab), Hosp Order     Status: None   Collection Time: 10/04/18  4:18 AM   Specimen: Nasopharyngeal Swab  Result Value Ref Range Status   SARS Coronavirus 2 NEGATIVE NEGATIVE Final    Comment: (NOTE) If result is NEGATIVE SARS-CoV-2 target nucleic acids are NOT DETECTED. The SARS-CoV-2 RNA is generally detectable in upper and lower  respiratory specimens during the acute phase of infection. The lowest  concentration of SARS-CoV-2 viral copies this assay can detect is 250  copies / mL. A negative result does not preclude SARS-CoV-2 infection  and should not be used as the sole basis for treatment or other  patient management decisions.  A negative result may occur with  improper specimen collection / handling, submission of specimen other  than nasopharyngeal swab, presence of viral mutation(s) within the  areas targeted by this assay, and inadequate number of viral copies  (<250 copies / mL). A negative result must be combined with clinical  observations, patient history, and epidemiological information. If result is POSITIVE SARS-CoV-2 target nucleic acids are DETECTED. The SARS-CoV-2 RNA is generally detectable  in upper and lower   respiratory specimens dur ing the acute phase of infection.  Positive  results are indicative of active infection with SARS-CoV-2.  Clinical  correlation with patient history and other diagnostic information is  necessary to determine patient infection status.  Positive results do  not rule out bacterial infection or co-infection with other viruses. If result is PRESUMPTIVE POSTIVE SARS-CoV-2 nucleic acids MAY BE PRESENT.   A presumptive positive result was obtained on the submitted specimen  and confirmed on repeat testing.  While 2019 novel coronavirus  (SARS-CoV-2) nucleic acids may be present in the submitted sample  additional confirmatory testing may be necessary for epidemiological  and / or clinical management purposes  to differentiate between  SARS-CoV-2 and other Sarbecovirus currently known to infect humans.  If clinically indicated additional testing with an alternate test  methodology [phone]) is advised. The SARS-CoV-2 RNA is generally  detectable in upper and lower respiratory sp ecimens during the acute  phase of infection. The expected result is Negative. Fact Sheet for Patients:  StrictlyIdeas.no Fact Sheet for Healthcare Providers: BankingDealers.co.za This test is not yet approved or cleared by the [place] FDA and has been authorized for detection and/or diagnosis of SARS-CoV-2 by FDA under an Emergency Use Authorization (EUA).  This EUA will remain in effect (meaning this test can be used) for the duration of the COVID-19 declaration under Section 564(b)(1) of the Act, 21 U.S.C. section 360bbb-3(b)(1), unless the authorization is terminated or revoked sooner. Performed at [facility], [place] [address], [place], [place] [address]      Labs: BNP (last 3 results) No results for input(s): BNP in the last 8760 hours. Basic Metabolic Panel: Recent Labs  Lab 10/03/18 2310 10/05/18 0407  NA 135 136   K 4.0 4.0  CL 103 104  CO2 20* 20*  GLUCOSE 116* 91  BUN 41* 39*  CREATININE 3.38* 3.26*  CALCIUM 9.1 9.1   Liver Function Tests: Recent Labs  Lab 10/03/18 2310 10/05/18 0407  AST 19 12*  ALT 18 14  ALKPHOS 83 77  BILITOT 0.3 0.4  PROT 6.8 6.7  ALBUMIN 3.0* 2.8*   Recent Labs  Lab 10/03/18 2310  LIPASE 32   No results for input(s): AMMONIA in the last 168 hours. CBC: Recent Labs  Lab 10/03/18 2310 10/04/18 1400 10/05/18 0407  WBC 9.1  --  7.4  HGB 6.8* 8.2* 8.1*  HCT 20.7* 24.6* 24.1*  MCV 69.9*  --  70.3*  PLT 334  --  340   Cardiac Enzymes: No results for input(s): CKTOTAL, CKMB, CKMBINDEX, TROPONINI in the last 168 hours. BNP: Invalid input(s): POCBNP CBG: Recent Labs  Lab 10/04/18 0852  GLUCAP 97   D-Dimer Recent Labs    10/04/18 0410  DDIMER 6.08*   Hgb A1c No results for input(s): HGBA1C in the last 72 hours. Lipid Profile No results for input(s): CHOL, HDL, LDLCALC, TRIG, CHOLHDL, LDLDIRECT in the last 72 hours. Thyroid function studies No results for input(s): TSH, T4TOTAL, T3FREE, THYROIDAB in the last 72 hours.  Invalid input(s): FREET3 Anemia work up No results for input(s): VITAMINB12, FOLATE, FERRITIN, TIBC, IRON, RETICCTPCT in the last 72 hours. Urinalysis    Component Value Date/Time   COLORURINE YELLOW 10/03/2018 2332   APPEARANCEUR HAZY (A) 10/03/2018 2332   LABSPEC 1.014 10/03/2018 2332   PHURINE 5.0 10/03/2018 2332   GLUCOSEU NEGATIVE 10/03/2018 2332   HGBUR SMALL (A) 10/03/2018 [place] NEGATIVE 10/03/2018 2332  KETONESUR NEGATIVE 10/03/2018 2332   PROTEINUR 100 (A) 10/03/2018 2332   NITRITE NEGATIVE 10/03/2018 2332   LEUKOCYTESUR NEGATIVE 10/03/2018 2332   Sepsis Labs Invalid input(s): PROCALCITONIN,  WBC,  LACTICIDVEN Microbiology Recent Results (from the past 240 hour(s))  SARS Coronavirus 2 (CEPHEID - Performed in [place] hospital lab), Hosp Order     Status: None   Collection Time: 10/04/18   4:18 AM   Specimen: Nasopharyngeal Swab  Result Value Ref Range Status   SARS Coronavirus 2 NEGATIVE NEGATIVE Final    Comment: (NOTE) If result is NEGATIVE SARS-CoV-2 target nucleic acids are NOT DETECTED. The SARS-CoV-2 RNA is generally detectable in upper and lower  respiratory specimens during the acute phase of infection. The lowest  concentration of SARS-CoV-2 viral copies this assay can detect is 250  copies / mL. A negative result does not preclude SARS-CoV-2 infection  and should not be used as the sole basis for treatment or other  patient management decisions.  A negative result may occur with  improper specimen collection / handling, submission of specimen other  than nasopharyngeal swab, presence of viral mutation(s) within the  areas targeted by this assay, and inadequate number of viral copies  (<250 copies / mL). A negative result must be combined with clinical  observations, patient history, and epidemiological information. If result is POSITIVE SARS-CoV-2 target nucleic acids are DETECTED. The SARS-CoV-2 RNA is generally detectable in upper and lower  respiratory specimens dur ing the acute phase of infection.  Positive  results are indicative of active infection with SARS-CoV-2.  Clinical  correlation with patient history and other diagnostic information is  necessary to determine patient infection status.  Positive results do  not rule out bacterial infection or co-infection with other viruses. If result is PRESUMPTIVE POSTIVE SARS-CoV-2 nucleic acids MAY BE PRESENT.   A presumptive positive result was obtained on the submitted specimen  and confirmed on repeat testing.  While 2019 novel coronavirus  (SARS-CoV-2) nucleic acids may be present in the submitted sample  additional confirmatory testing may be necessary for epidemiological  and / or clinical management purposes  to differentiate between  SARS-CoV-2 and other Sarbecovirus currently known to infect  humans.  If clinically indicated additional testing with an alternate test  methodology [phone]) is advised. The SARS-CoV-2 RNA is generally  detectable in upper and lower respiratory sp ecimens during the acute  phase of infection. The expected result is Negative. Fact Sheet for Patients:  StrictlyIdeas.no Fact Sheet for Healthcare Providers: BankingDealers.co.za This test is not yet approved or cleared by the [place] FDA and has been authorized for detection and/or diagnosis of SARS-CoV-2 by FDA under an Emergency Use Authorization (EUA).  This EUA will remain in effect (meaning this test can be used) for the duration of the COVID-19 declaration under Section 564(b)(1) of the Act, 21 U.S.C. section 360bbb-3(b)(1), unless the authorization is terminated or revoked sooner. Performed at [facility], [place] [address], [place], [place] [address]      Time coordinating discharge: Over 30 minutes  SIGNED:   Little Ishikawa, DO [facility] 10/05/2018, 1:25 PM Pager   If 7PM-7AM, please contact night-coverage [email or web address] Password [email or web address]

## 2018-10-05 NOTE — Discharge Instructions (Signed)
Acute Kidney Injury, Adult ° °Acute kidney injury is a sudden worsening of kidney function. The kidneys are organs that have several jobs. They filter the blood to remove waste products and extra fluid. They also maintain a healthy balance of minerals and hormones in the body, which helps control blood pressure and keep bones strong. With this condition, your kidneys do not do their jobs as well as they should. °This condition ranges from mild to severe. Over time it may develop into long-lasting (chronic) kidney disease. Early detection and treatment may prevent acute kidney injury from developing into a chronic condition. °What are the causes? °Common causes of this condition include: °· A problem with blood flow to the kidneys. This may be caused by: °? Low blood pressure (hypotension) or shock. °? Blood loss. °? Heart and blood vessel (cardiovascular) disease. °? Severe burns. °? Liver disease. °· Direct damage to the kidneys. This may be caused by: °? Certain medicines. °? A kidney infection. °? Poisoning. °? Being around or in contact with toxic substances. °? A surgical wound. °? A hard, direct hit to the kidney area. °· A sudden blockage of urine flow. This may be caused by: °? Cancer. °? Kidney stones. °? An enlarged prostate in males. °What are the signs or symptoms? °Symptoms of this condition may not be obvious until the condition becomes severe. Symptoms of this condition can include: °· Tiredness (lethargy), or difficulty staying awake. °· Nausea or vomiting. °· Swelling (edema) of the face, legs, ankles, or feet. °· Problems with urination, such as: °? Abdominal pain, or pain along the side of your stomach (flank). °? Decreased urine production. °? Decrease in the force of urine flow. °· Muscle twitches and cramps, especially in the legs. °· Confusion or trouble concentrating. °· Loss of appetite. °· Fever. °How is this diagnosed? °This condition may be diagnosed with tests, including: °· Blood  tests. °· Urine tests. °· Imaging tests. °· A test in which a sample of tissue is removed from the kidneys to be examined under a microscope (kidney biopsy). °How is this treated? °Treatment for this condition depends on the cause and how severe the condition is. In mild cases, treatment may not be needed. The kidneys may heal on their own. In more severe cases, treatment will involve: °· Treating the cause of the kidney injury. This may involve changing any medicines you are taking or adjusting your dosage. °· Fluids. You may need specialized IV fluids to balance your body's needs. °· Having a catheter placed to drain urine and prevent blockages. °· Preventing problems from occurring. This may mean avoiding certain medicines or procedures that can cause further injury to the kidneys. °In some cases treatment may also require: °· A procedure to remove toxic wastes from the body (dialysis or continuous renal replacement therapy - CRRT). °· Surgery. This may be done to repair a torn kidney, or to remove the blockage from the urinary system. °Follow these instructions at home: °Medicines °· Take over-the-counter and prescription medicines only as told by your health care provider. °· Do not take any new medicines without your health care provider's approval. Many medicines can worsen your kidney damage. °· Do not take any vitamin and mineral supplements without your health care provider's approval. Many nutritional supplements can worsen your kidney damage. °Lifestyle °· If your health care provider prescribed changes to your diet, follow them. You may need to decrease the amount of protein you eat. °· Achieve and maintain a healthy   weight. If you need help with this, ask your health care provider.  Start or continue an exercise plan. Try to exercise at least 30 minutes a day, 5 days a week.  Do not use any tobacco products, such as cigarettes, chewing tobacco, and e-cigarettes. If you need help quitting, ask your  health care provider. General instructions  Keep track of your blood pressure. Report changes in your blood pressure as told by your health care provider.  Stay up to date with immunizations. Ask your health care provider which immunizations you need.  Keep all follow-up visits as told by your health care provider. This is important. Where to find more information  American Association of Kidney Patients: BombTimer.gl  National Kidney Foundation: www.kidney.[place]: https://mathis.com/  Life Options Rehabilitation Program: ? www.lifeoptions.org ? www.kidneyschool.org Contact a health care provider if:  Your symptoms get worse.  You develop new symptoms. Get help right away if:  You develop symptoms of worsening kidney disease, which include: ? Headaches. ? Abnormally dark or light skin. ? Easy bruising. ? Frequent hiccups. ? Chest pain. ? Shortness of breath. ? End of menstruation in women. ? Seizures. ? Confusion or altered mental status. ? Abdominal or back pain. ? Itchiness.  You have a fever.  Your body is producing less urine.  You have pain or bleeding when you urinate. Summary  Acute kidney injury is a sudden worsening of kidney function.  Acute kidney injury can be caused by problems with blood flow to the kidneys, direct damage to the kidneys, and sudden blockage of urine flow.  Symptoms of this condition may not be obvious until it becomes severe. Symptoms may include edema, lethargy, confusion, nausea or vomiting, and problems passing urine.  This condition can usually be diagnosed with blood tests, urine tests, and imaging tests. Sometimes a kidney biopsy is done to diagnose this condition.  Treatment for this condition often involves treating the underlying cause. It is treated with fluids, medicines, dialysis, diet changes, or surgery. This information is not intended to replace advice given to you by your health care provider. Make  sure you discuss any questions you have with your health care provider. Document Released: 09/06/2010 Document Revised: 02/03/2017 Document Reviewed: 02/12/2016 Elsevier Patient Education  2020 [place].   Chronic Kidney Disease, Adult Chronic kidney disease (CKD) occurs when the kidneys become damaged slowly over a long period of time. The kidneys are a pair of organs that do many important jobs in the body, including:  Removing waste and extra fluid from the blood to make urine.  Making hormones that maintain the amount of fluid in tissues and blood vessels.  Maintaining the right amount of fluids and chemicals in the body. A small amount of kidney damage may not cause problems, but a large amount of damage may make it hard or impossible for the kidneys to work the way they should. If steps are not taken to slow down kidney damage or to stop it from getting worse, the kidneys may stop working permanently (end-stage renal disease or ESRD). Most of the time, CKD does not go away, but it can often be controlled. People who have CKD are usually able to live normal lives. What are the causes? The most common causes of this condition are diabetes and high blood pressure (hypertension). Other causes include:  Heart and blood vessel (cardiovascular) disease.  Kidney diseases, such as: ? Glomerulonephritis. ? Interstitial nephritis. ? Polycystic kidney disease. ? Renal vascular disease.  Diseases that affect the immune system.  Genetic diseases.  Medicines that damage the kidneys, such as anti-inflammatory medicines.  Being around or being in contact with poisonous (toxic) substances.  A kidney or urinary infection that occurs again and again (recurs).  Vasculitis. This is swelling or inflammation of the blood vessels.  A problem with urine flow that may be caused by: ? Cancer. ? Having kidney stones more than one time. ? An enlarged prostate, in males. What increases the  risk? You are more likely to develop this condition if you:  Are older than age 35.  Are female.  Are African-American, Hispanic, Asian, [place], or American [place].  Are a current or former smoker.  Are obese.  Have a family history of kidney disease or failure.  Often take medicines that are damaging to the kidneys. What are the signs or symptoms? Symptoms of this condition include:  Swelling (edema) of the face, legs, ankles, or feet.  Tiredness (lethargy) and having less energy.  Nausea or vomiting.  Confusion or trouble concentrating.  Problems with urination, such as: ? Painful or burning feeling during urination. ? Decreased urine production. ? Frequent urination, especially at night. ? Bloody urine.  Muscle twitches and cramps, especially in the legs.  Shortness of breath.  Weakness.  Loss of appetite.  Metallic taste in the mouth.  Trouble sleeping.  Dry, itchy skin.  A low blood count (anemia).  Pale lining of the eyelids and surface of the eye (conjunctiva). Symptoms develop slowly and may not be obvious until the kidney damage becomes severe. It is possible to have kidney disease for years without having any symptoms. How is this diagnosed? This condition may be diagnosed based on:  Blood tests.  Urine tests.  Imaging tests, such as an ultrasound or CT scan.  A test in which a sample of tissue is removed from the kidneys to be examined under a microscope (kidney biopsy). These test results will help your health care provider determine how serious the CKD is. How is this treated? There is no cure for most cases of this condition, but treatment usually relieves symptoms and prevents or slows the progression of the disease. Treatment may include:  Making diet changes, which may require you to avoid alcohol, salty foods (sodium), and foods that are high in potassium, calcium, and protein.  Medicines: ? To lower blood pressure. ? To  control blood glucose. ? To relieve anemia. ? To relieve swelling. ? To protect your bones. ? To improve the balance of electrolytes in your blood.  Removing toxic waste from the body through types of dialysis, if the kidneys can no longer do their job (kidney failure).  Managing any other conditions that are causing your CKD or making it worse. Follow these instructions at home: Medicines  Take over-the-counter and prescription medicines only as told by your health care provider. The dose of some medicines that you take may need to be adjusted.  Do not take any new medicines unless approved by your health care provider. Many medicines can worsen your kidney damage.  Do not take any vitamin and mineral supplements unless approved by your health care provider. Many nutritional supplements can worsen your kidney damage. General instructions  Follow your prescribed diet as told by your health care provider.  Do not use any products that contain nicotine or tobacco, such as cigarettes and e-cigarettes. If you need help quitting, ask your health care provider.  Monitor and track your blood pressure  at home. Report changes in your blood pressure as told by your health care provider.  If you are being treated for diabetes, monitor and track your blood sugar (blood glucose) levels as told by your health care provider.  Maintain a healthy weight. If you need help with this, ask your health care provider.  Start or continue an exercise plan. Exercise at least 30 minutes a day, 5 days a week.  Keep your immunizations up to date as told by your health care provider.  Keep all follow-up visits as told by your health care provider. This is important. Where to find more information  American Association of Kidney Patients: BombTimer.gl  National Kidney Foundation: www.kidney.[place]: https://mathis.com/  Life Options Rehabilitation Program: www.lifeoptions.org and  www.kidneyschool.org Contact a health care provider if:  Your symptoms get worse.  You develop new symptoms. Get help right away if:  You develop symptoms of ESRD, which include: ? Headaches. ? Numbness in the hands or feet. ? Easy bruising. ? Frequent hiccups. ? Chest pain. ? Shortness of breath. ? Lack of menstruation, in women.  You have a fever.  You have decreased urine production.  You have pain or bleeding when you urinate. Summary  Chronic kidney disease (CKD) occurs when the kidneys become damaged slowly over a long period of time.  The most common causes of this condition are diabetes and high blood pressure (hypertension).  There is no cure for most cases of this condition, but treatment usually relieves symptoms and prevents or slows the progression of the disease. Treatment may include a combination of medicines and lifestyle changes. This information is not intended to replace advice given to you by your health care provider. Make sure you discuss any questions you have with your health care provider. Document Released: 12/01/2007 Document Revised: 02/03/2017 Document Reviewed: 03/31/2016 Elsevier Patient Education  2020 [employer].

## 2018-10-09 ENCOUNTER — Ambulatory Visit
Admission: RE | Admit: 2018-10-09 | Discharge: 2018-10-09 | Disposition: A | Discharge status: Home / Self Care | Payer: Medicaid Other | Source: Ambulatory Visit | Attending: Emergency Medicine | Admitting: Emergency Medicine

## 2018-10-09 ENCOUNTER — Other Ambulatory Visit: Payer: Self-pay

## 2018-10-09 DIAGNOSIS — R109 Unspecified abdominal pain: Secondary | ICD-10-CM | POA: Insufficient documentation

## 2018-10-09 NOTE — ED Provider Notes (Signed)
[facility] EMERGENCY DEPT VIDEO VISIT Provider Note   CSN: [id number] Arrival date & time: 10/09/18  7517    History   Chief Complaint No chief complaint on file.   HPI Latasha Drake is a 38 y.o. female.     HPI   Telehealth visit  38 year old female presents today for medication refill.  She notes that she had a kidney biopsy in July 24.  She developed pain after that.  She was seen in the emergency room on the 29th at that time she did have a blood transfusion secondary to anemia.  She was discharged on the 31st.  At that time she was still having pain but improved over admission.  She notes the pain has continued to improve with no severe pain.  She notes pain is located in her left flank at the same site as previous end of the kidney biopsy.  She notes she was sent home with narcotic pain medication she has run out of the medication, she saw her nephrologist yesterday with reassuring evaluation but was told that he would not refill her narcotic pain medication, her primary care also would not refill the pain medication.  Denies any fever nausea vomiting, notes normal urination and is feeling otherwise well.  She has been using Tylenol as needed for pain.      Past Medical History:  Diagnosis Date  . Anemia   . Chronic kidney disease   . Hypertension   . Lupus [facility])     Patient Active Problem List   Diagnosis Date Noted  . Acute on chronic blood loss anemia 10/04/2018  . Symptomatic anemia 10/04/2018  . Lupus ([place])   . Hypertension   . Chronic kidney disease   . Acute renal failure superimposed on chronic kidney disease ([place])   . Elevated d-dimer   . Pericardial effusion   . Traumatic perinephric hematoma of left kidney     Past Surgical History:  Procedure Laterality Date  . CESAREAN SECTION    . HERNIA REPAIR       OB History   No obstetric history on file.      Home Medications    Prior to Admission medications   Medication Sig Start Date End Date Taking?  Authorizing Provider  citalopram (CELEXA) 40 MG tablet Take 40 mg by mouth daily.  10/27/16 10/04/18  [provider]  labetalol (NORMODYNE) 100 MG tablet Take 100 mg by mouth 2 (two) times daily.  11/17/16   [provider]  NIFEdipine (PROCARDIA-XL/ADALAT CC) 60 MG 24 hr tablet Take 60 mg by mouth daily. 12/03/16   [provider]  ondansetron (ZOFRAN) 4 MG tablet Take 1 tablet (4 mg total) by mouth every 8 (eight) hours as needed for nausea or vomiting. Patient not taking: Reported on 12/30/2016 05/28/15   Mackuen, Courteney Lyn, MD  pantoprazole (PROTONIX) 40 MG tablet Take 40 mg by mouth daily as needed (indigestion).  10/27/16   [provider]  paragard intrauterine copper IUD IUD 1 Intra Uterine Device by Intrauterine route once.    [provider]  sucralfate (CARAFATE) 1 GM/10ML suspension Take 1 g by mouth daily as needed (indigestion).  10/27/16   [provider]  Vitamin D, Ergocalciferol, (DRISDOL) 50000 units CAPS capsule Take 50,000 Units by mouth every Sunday. Take on Sun. 06/23/15   [provider]    Family History Family History  Problem Relation Age of Onset  . Lupus Other  cousin    Social History Social History   Tobacco Use  . Smoking status: Current Every Day Smoker    Packs/day: 0.50    Types: Cigarettes  . Smokeless tobacco: Never Used  Substance Use Topics  . Alcohol use: Yes    Comment: occassionally  . Drug use: No     Allergies   Lisinopril   Review of Systems Review of Systems  Constitutional: Negative for fever.  Genitourinary: Positive for flank pain. Negative for decreased urine volume and dysuria.       tPhysical Exam Updated Vital Signs  LMP 10/04/2018   Physical Exam  Telehealth visit, patient speaking in full sentences smiling throughout exam in no acute distress ED Treatments / Results  Labs (all labs ordered are listed, but only abnormal results are displayed)  Labs Reviewed - No data to display  EKG None  Radiology No results found.  Procedures Procedures (including critical care time)  Medications Ordered in ED Medications - No data to display   Initial Impression / Assessment and Plan / ED Course  I have reviewed the triage vital signs and the nursing notes.  Pertinent labs & imaging results that were available during my care of the patient were reviewed by me and considered in my medical decision making (see chart for details).        38 year old female presents today for refill of her pain medication.  She admittedly notes that her symptoms are improving, she has no new symptoms has had recent evaluation with nephrologist yesterday.  I do not feel that patient requires narcotic pain medication and will not refill it via telehealth visit.  I have discussed that if she is having pain and needs narcotic management that she can come to the emergency room and be evaluated at that time.  Patient questioning if she can use ibuprofen I would like to defer this to her nephrologist given her elevated creatinine, I encouraged her use Tylenol.  She is given return precautions.  Verbalized understanding and agreement to today's plan.  Final Clinical Impressions(s) / ED Diagnoses   Final diagnoses:  Flank pain    ED Discharge Orders    None       Okey Regal, PA-C 10/10/18 1147    Tegeler, Gwenyth Allegra, MD 10/10/18 [phone]

## 2018-10-09 NOTE — Discharge Instructions (Signed)
Please read attached information. If you experience any new or worsening signs or symptoms please return to the emergency room for evaluation. Please follow-up with your primary care provider or specialist as discussed.  °

## 2018-10-26 DIAGNOSIS — F329 Major depressive disorder, single episode, unspecified: Secondary | ICD-10-CM | POA: Diagnosis present

## 2019-04-12 ENCOUNTER — Other Ambulatory Visit: Payer: Self-pay

## 2019-04-12 ENCOUNTER — Inpatient Hospital Stay (HOSPITAL_COMMUNITY)
Admission: EM | Admit: 2019-04-12 | Discharge: 2019-04-14 | DRG: 193 | Disposition: A | Discharge status: Home / Self Care | Payer: Medicaid Other | Attending: Internal Medicine | Admitting: Internal Medicine

## 2019-04-12 ENCOUNTER — Emergency Department (HOSPITAL_COMMUNITY): Payer: Medicaid Other

## 2019-04-12 ENCOUNTER — Encounter (HOSPITAL_COMMUNITY): Payer: Self-pay | Admitting: Emergency Medicine

## 2019-04-12 DIAGNOSIS — N186 End stage renal disease: Secondary | ICD-10-CM | POA: Diagnosis present

## 2019-04-12 DIAGNOSIS — Z7952 Long term (current) use of systemic steroids: Secondary | ICD-10-CM | POA: Diagnosis not present

## 2019-04-12 DIAGNOSIS — R079 Chest pain, unspecified: Secondary | ICD-10-CM

## 2019-04-12 DIAGNOSIS — Z888 Allergy status to other drugs, medicaments and biological substances status: Secondary | ICD-10-CM | POA: Diagnosis not present

## 2019-04-12 DIAGNOSIS — I120 Hypertensive chronic kidney disease with stage 5 chronic kidney disease or end stage renal disease: Secondary | ICD-10-CM | POA: Diagnosis present

## 2019-04-12 DIAGNOSIS — Z79891 Long term (current) use of opiate analgesic: Secondary | ICD-10-CM | POA: Diagnosis not present

## 2019-04-12 DIAGNOSIS — I313 Pericardial effusion (noninflammatory): Secondary | ICD-10-CM | POA: Diagnosis present

## 2019-04-12 DIAGNOSIS — J189 Pneumonia, unspecified organism: Secondary | ICD-10-CM | POA: Diagnosis present

## 2019-04-12 DIAGNOSIS — R188 Other ascites: Secondary | ICD-10-CM | POA: Diagnosis present

## 2019-04-12 DIAGNOSIS — I071 Rheumatic tricuspid insufficiency: Secondary | ICD-10-CM | POA: Diagnosis present

## 2019-04-12 DIAGNOSIS — F17210 Nicotine dependence, cigarettes, uncomplicated: Secondary | ICD-10-CM | POA: Diagnosis present

## 2019-04-12 DIAGNOSIS — R69 Illness, unspecified: Secondary | ICD-10-CM

## 2019-04-12 DIAGNOSIS — Z975 Presence of (intrauterine) contraceptive device: Secondary | ICD-10-CM | POA: Diagnosis not present

## 2019-04-12 DIAGNOSIS — D631 Anemia in chronic kidney disease: Secondary | ICD-10-CM | POA: Diagnosis present

## 2019-04-12 DIAGNOSIS — R071 Chest pain on breathing: Secondary | ICD-10-CM | POA: Diagnosis not present

## 2019-04-12 DIAGNOSIS — M3214 Glomerular disease in systemic lupus erythematosus: Secondary | ICD-10-CM | POA: Diagnosis present

## 2019-04-12 DIAGNOSIS — J90 Pleural effusion, not elsewhere classified: Secondary | ICD-10-CM | POA: Diagnosis present

## 2019-04-12 DIAGNOSIS — I10 Essential (primary) hypertension: Secondary | ICD-10-CM | POA: Diagnosis present

## 2019-04-12 DIAGNOSIS — Z20822 Contact with and (suspected) exposure to covid-19: Secondary | ICD-10-CM | POA: Diagnosis present

## 2019-04-12 DIAGNOSIS — F419 Anxiety disorder, unspecified: Secondary | ICD-10-CM | POA: Diagnosis present

## 2019-04-12 DIAGNOSIS — Z992 Dependence on renal dialysis: Secondary | ICD-10-CM | POA: Diagnosis not present

## 2019-04-12 DIAGNOSIS — Z79899 Other long term (current) drug therapy: Secondary | ICD-10-CM | POA: Diagnosis not present

## 2019-04-12 DIAGNOSIS — R0789 Other chest pain: Secondary | ICD-10-CM

## 2019-04-12 DIAGNOSIS — I3139 Other pericardial effusion (noninflammatory): Secondary | ICD-10-CM | POA: Diagnosis present

## 2019-04-12 DIAGNOSIS — D849 Immunodeficiency, unspecified: Secondary | ICD-10-CM | POA: Diagnosis present

## 2019-04-12 DIAGNOSIS — Z7951 Long term (current) use of inhaled steroids: Secondary | ICD-10-CM | POA: Diagnosis not present

## 2019-04-12 HISTORY — DX: Pneumonia, unspecified organism: J18.9

## 2019-04-12 LAB — CBC
HCT: 27.5 % — ABNORMAL LOW (ref 36.0–46.0)
Hemoglobin: 9.2 g/dL — ABNORMAL LOW (ref 12.0–15.0)
MCH: 23.3 pg — ABNORMAL LOW (ref 26.0–34.0)
MCHC: 33.5 g/dL (ref 30.0–36.0)
MCV: 69.6 fL — ABNORMAL LOW (ref 80.0–100.0)
Platelets: 523 10*3/uL — ABNORMAL HIGH (ref 150–400)
RBC: 3.95 MIL/uL (ref 3.87–5.11)
RDW: 17.2 % — ABNORMAL HIGH (ref 11.5–15.5)
WBC: 14.7 10*3/uL — ABNORMAL HIGH (ref 4.0–10.5)
nRBC: 0 % (ref 0.0–0.2)

## 2019-04-12 LAB — BASIC METABOLIC PANEL
Anion gap: 15 (ref 5–15)
BUN: 83 mg/dL — ABNORMAL HIGH (ref 6–20)
CO2: 22 mmol/L (ref 22–32)
Calcium: 8.6 mg/dL — ABNORMAL LOW (ref 8.9–10.3)
Chloride: 104 mmol/L (ref 98–111)
Creatinine, Ser: 8.08 mg/dL — ABNORMAL HIGH (ref 0.44–1.00)
GFR calc Af Amer: 7 mL/min — ABNORMAL LOW (ref >60)
GFR calc non Af Amer: 6 mL/min — ABNORMAL LOW (ref >60)
Glucose, Bld: 98 mg/dL (ref 70–99)
Potassium: 3.9 mmol/L (ref 3.5–5.1)
Sodium: 141 mmol/L (ref 135–145)

## 2019-04-12 LAB — D-DIMER, QUANTITATIVE: D-Dimer, Quant: 16.19 ug/mL-FEU — ABNORMAL HIGH (ref 0.00–0.50)

## 2019-04-12 LAB — TROPONIN I (HIGH SENSITIVITY)
Troponin I (High Sensitivity): 8 ng/L (ref <18)
Troponin I (High Sensitivity): 8 ng/L (ref <18)

## 2019-04-12 LAB — HEPATIC FUNCTION PANEL
ALT: 9 U/L (ref 0–44)
AST: 14 U/L — ABNORMAL LOW (ref 15–41)
Albumin: 3.5 g/dL (ref 3.5–5.0)
Alkaline Phosphatase: 82 U/L (ref 38–126)
Bilirubin, Direct: <0.1 mg/dL (ref 0.0–0.2)
Total Bilirubin: 0.6 mg/dL (ref 0.3–1.2)
Total Protein: 7.1 g/dL (ref 6.5–8.1)

## 2019-04-12 LAB — RESPIRATORY PANEL BY RT PCR (FLU A&B, COVID)
Influenza A by PCR: NEGATIVE
Influenza B by PCR: NEGATIVE
SARS Coronavirus 2 by RT PCR: NEGATIVE

## 2019-04-12 LAB — LACTIC ACID, PLASMA
Lactic Acid, Venous: 1.1 mmol/L (ref 0.5–1.9)
Lactic Acid, Venous: 0.8 mmol/L (ref 0.5–1.9)

## 2019-04-12 LAB — LIPASE, BLOOD: Lipase: 59 U/L — ABNORMAL HIGH (ref 11–51)

## 2019-04-12 LAB — BRAIN NATRIURETIC PEPTIDE: B Natriuretic Peptide: 262.8 pg/mL — ABNORMAL HIGH (ref 0.0–100.0)

## 2019-04-12 LAB — I-STAT BETA HCG BLOOD, ED (MC, WL, AP ONLY): I-stat hCG, quantitative: <5 m[IU]/mL (ref <5)

## 2019-04-12 MED ORDER — SODIUM CHLORIDE 0.9% FLUSH: 3.0000 mL | Freq: Once | INTRAVENOUS | Status: DC

## 2019-04-12 MED ORDER — HYDROMORPHONE HCL 1 MG/ML IJ SOLN
1.0000 mg | Freq: Once | INTRAMUSCULAR | Status: AC
  Administered 2019-04-12: 1 mg via INTRAVENOUS
  Filled 2019-04-12: qty 1

## 2019-04-12 MED ORDER — HYDROMORPHONE HCL 1 MG/ML IJ SOLN
1.0000 mg | INTRAMUSCULAR | Status: AC | PRN
  Administered 2019-04-13 (×2): 1 mg via INTRAVENOUS
  Filled 2019-04-12 (×2): qty 1

## 2019-04-12 MED ORDER — SODIUM CHLORIDE 0.9 % IV SOLN
500.0000 mg | Freq: Once | INTRAVENOUS | Status: AC
  Administered 2019-04-12: 500 mg via INTRAVENOUS
  Filled 2019-04-12: qty 500

## 2019-04-12 MED ORDER — HEPARIN SODIUM (PORCINE) 5000 UNIT/ML IJ SOLN
5000.0000 [IU] | Freq: Three times a day (TID) | INTRAMUSCULAR | Status: DC
  Administered 2019-04-13 (×2): 5000 [IU] via SUBCUTANEOUS
  Filled 2019-04-12 (×4): qty 1

## 2019-04-12 MED ORDER — SODIUM CHLORIDE 0.9 % IV SOLN
1.0000 g | Freq: Once | INTRAVENOUS | Status: AC
  Administered 2019-04-13: 1 g via INTRAVENOUS
  Filled 2019-04-12: qty 10

## 2019-04-12 NOTE — ED Notes (Signed)
Pt has the following extra tubes in the main lab:  1 blue top 2 gold tops

## 2019-04-12 NOTE — H&P (Addendum)
TRH H&P    Patient Demographics:    Latasha Drake, is a 39 y.o. female  MRN: [id number]  DOB - 10-13-1980  Admit Date - 04/12/2019  Referring MD/NP/PA:  Charlesetta Shanks  Outpatient Primary MD for the patient is [facility] (Inactive)  Patient coming from:  home  Chief complaint- dyspnea   HPI:    Latasha Drake  is a 39 y.o. female, w hypertension, lupus, anemia, ESRD on peritoneal dialysis, recent PD catheter adjustment about 1 week ago,  presents with chest pain and dyspnea x 2 days.  Slight dry cough.  Chest pain is with deep inspiration. Pt denies fever, chills, covid exposure, palp, n/v, abd pain, diarrhea, brbpr, dysuria, hematuria.    In ED,  T 98.3, P 98 R 30, Bp 168/111 pox 97% on RA Wt 72.6kg  CT chest  IMPRESSION: 1. Moderate severity right lower lobe consolidation. While this is likely secondary to a combination of atelectasis and/or infiltrate, an underlying neoplastic process cannot be excluded. Follow-up to resolution is recommended. 2. Bilateral pleural effusions, right greater than left. 3. Small pericardial effusion which represents a new finding compared to the prior study dated May 28, 2015. 4. Moderate amount of intra-abdominal free fluid.  Na 141, K 3.9, Bun 83, Creatinine 8.08 Ast 14, Alt 9 Lipase 59 Wbc 14.7, Hgb 9.2, Plt 523 mcv 69.6, Rdw 17.2 Trop 8 BNP 262.8 D dimer 16.19 Blood culture x2 pending  covid-19 pending  Rocephin 1gm iv x1, zithromax 500mg  iv x1  Pt will be admitted for pneumonia.        Review of systems:    In addition to the HPI above,  No Fever-chills, No Headache, No changes with Vision or hearing, No problems swallowing food or Liquids,  No Abdominal pain, No Nausea or Vomiting, bowel movements are regular, No Blood in stool or Urine, No dysuria, No new skin rashes or bruises, No new joints pains-aches,  No  new weakness, tingling, numbness in any extremity, No recent weight gain or loss, No polyuria, polydypsia or polyphagia, No significant Mental Stressors.  All other systems reviewed and are negative.    Past History of the following :    Past Medical History:  Diagnosis Date  . Anemia   . Chronic kidney disease   . Hypertension   . Lupus [facility])       Past Surgical History:  Procedure Laterality Date  . CESAREAN SECTION    . HERNIA REPAIR        Social History:      Social History   Tobacco Use  . Smoking status: Current Every Day Smoker    Packs/day: 0.50    Types: Cigarettes  . Smokeless tobacco: Never Used  Substance Use Topics  . Alcohol use: Yes    Comment: occassionally       Family History :     Family History  Problem Relation Age of Onset  . Lupus Other        cousin  Home Medications:   Prior to Admission medications   Medication Sig Start Date End Date Taking? Authorizing Provider  citalopram (CELEXA) 40 MG tablet Take 40 mg by mouth daily.   Yes [provider]  cyclobenzaprine (FLEXERIL) 10 MG tablet Take 10 mg by mouth 3 (three) times daily as needed for muscle spasms.  04/03/19  Yes [provider]  furosemide (LASIX) 40 MG tablet Take 40 mg by mouth daily. 03/23/19  Yes [provider]  labetalol (NORMODYNE) 300 MG tablet Take 300 mg by mouth 2 (two) times daily.   Yes [provider]  NIFEdipine (PROCARDIA XL/NIFEDICAL XL) 60 MG 24 hr tablet Take 60 mg by mouth 2 (two) times daily. 03/23/19  Yes [provider]  ondansetron (ZOFRAN) 4 MG tablet Take 1 tablet (4 mg total) by mouth every 8 (eight) hours as needed for nausea or vomiting. 05/28/15  Yes Mackuen, Courteney Lyn, MD  pantoprazole (PROTONIX) 40 MG tablet Take 40 mg by mouth daily as needed (indigestion).  10/27/16  Yes [provider]  paragard intrauterine copper IUD IUD 1 Intra Uterine Device by Intrauterine route once.   Yes  [provider]  predniSONE (DELTASONE) 20 MG tablet Take 20 mg by mouth daily.  01/23/19  Yes [provider]  traMADol (ULTRAM) 50 MG tablet Take 50 mg by mouth every 6 (six) hours as needed for moderate pain.  03/07/19  Yes [provider]  citalopram (CELEXA) 40 MG tablet Take 40 mg by mouth daily.  10/27/16 10/04/18  [provider]  Vitamin D, Ergocalciferol, (DRISDOL) 50000 units CAPS capsule Take 50,000 Units by mouth every Sunday. Take on Sun. 06/23/15   [provider]     Allergies:     Allergies  Allergen Reactions  . Lisinopril Swelling     Physical Exam:   Vitals  Blood pressure (!) 161/114, pulse 89, temperature 98.3 F (36.8 C), temperature source Oral, resp. rate (!) 27, height 5\' 4"  (1.626 m), weight 72.6 kg, last menstrual period 02/24/2019, SpO2 97 %.  1.  General: axoxo3  2. Psychiatric: euthymic  3. Neurologic: Nonfocal  4. HEENMT:  Anicteric, pupils 1.42mm symmetric, direct, consensual, near intact Neck: no jvd  5. Respiratory : Slight decrease in bs right base, no wheezing, slight crackles right lung base,   6. Cardiovascular : rrr s1, s2, no m/g/r  7. Gastrointestinal:  Abd: soft, nt, nd, +bs  8. Skin:  Ext: no c/c/e, no rash PD catheter in place in the right abdomen  9.Musculoskeletal:  Good ROM     Data Review:    CBC Recent Labs  Lab 04/12/19 1953  WBC 14.7*  HGB 9.2*  HCT 27.5*  PLT 523*  MCV 69.6*  MCH 23.3*  MCHC 33.5  RDW 17.2*   ------------------------------------------------------------------------------------------------------------------  Results for orders placed or performed during the hospital encounter of 04/12/19 (from the past 48 hour(s))  Basic metabolic panel     Status: Abnormal   Collection Time: 04/12/19  7:53 PM  Result Value Ref Range   Sodium 141 135 - 145 mmol/L   Potassium 3.9 3.5 - 5.1 mmol/L   Chloride 104 98 - 111 mmol/L   CO2 22 22 - 32 mmol/L     Glucose, Bld 98 70 - 99 mg/dL   BUN 83 (H) 6 - 20 mg/dL   Creatinine, Ser 8.08 (H) 0.44 - 1.00 mg/dL   Calcium 8.6 (L) 8.9 - 10.3 mg/dL   GFR calc non Af Wyvonnia Lora  6 (L) >60 mL/min   GFR calc Af Amer 7 (L) >60 mL/min   Anion gap 15 5 - 15    Comment: Performed at [facility], [place] [address] [place], [place] [address]  CBC     Status: Abnormal   Collection Time: 04/12/19  7:53 PM  Result Value Ref Range   WBC 14.7 (H) 4.0 - 10.5 K/uL   RBC 3.95 3.87 - 5.11 MIL/uL   Hemoglobin 9.2 (L) 12.0 - 15.0 g/dL   HCT 27.5 (L) 36.0 - 46.0 %   MCV 69.6 (L) 80.0 - 100.0 fL   MCH 23.3 (L) 26.0 - 34.0 pg   MCHC 33.5 30.0 - 36.0 g/dL   RDW 17.2 (H) 11.5 - 15.5 %   Platelets 523 (H) 150 - 400 K/uL   nRBC 0.0 0.0 - 0.2 %    Comment: Performed at [facility], [place] [address] [place], [place] [address]  Troponin I (High Sensitivity)     Status: None   Collection Time: 04/12/19  7:53 PM  Result Value Ref Range   Troponin I (High Sensitivity) 8 <18 ng/L    Comment: (NOTE) Elevated high sensitivity troponin I (hsTnI) values and significant  changes across serial measurements may suggest ACS but many other  chronic and acute conditions are known to elevate hsTnI results.  Refer to the "Links" section for chest pain algorithms and additional  guidance. Performed at [facility], [place] [address] [place], [place] [address]   Hepatic function panel     Status: Abnormal   Collection Time: 04/12/19  7:53 PM  Result Value Ref Range   Total Protein 7.1 6.5 - 8.1 g/dL   Albumin 3.5 3.5 - 5.0 g/dL   AST 14 (L) 15 - 41 U/L   ALT 9 0 - 44 U/L   Alkaline Phosphatase 82 38 - 126 U/L   Total Bilirubin 0.6 0.3 - 1.2 mg/dL   Bilirubin, Direct <0.1 0.0 - 0.2 mg/dL   Indirect Bilirubin NOT CALCULATED 0.3 - 0.9 mg/dL    Comment: Performed at [facility], [place] [address] [place], [place] [address]  Lipase, blood     Status: Abnormal    Collection Time: 04/12/19  7:53 PM  Result Value Ref Range   Lipase 59 (H) 11 - 51 U/L    Comment: Performed at [facility], [place] [address], [place], [place] [address]  Brain natriuretic peptide     Status: Abnormal   Collection Time: 04/12/19  7:53 PM  Result Value Ref Range   B Natriuretic Peptide 262.8 (H) 0.0 - 100.0 pg/mL    Comment: Performed at [facility], [place] [address], [place], [place] [address]  I-Stat beta hCG blood, ED     Status: None   Collection Time: 04/12/19  7:58 PM  Result Value Ref Range   I-stat hCG, quantitative <5.0 <5 mIU/mL   Comment 3            Comment:   GEST. AGE      CONC.  (mIU/mL)   <=1 WEEK        5 - 50     2 WEEKS       50 - 500     3 WEEKS       100 - 10,000     4 WEEKS     1,000 - 30,000        FEMALE AND NON-PREGNANT  FEMALE:     LESS THAN 5 mIU/mL   D-dimer, quantitative (not at [facility])     Status: Abnormal   Collection Time: 04/12/19  7:59 PM  Result Value Ref Range   D-Dimer, Quant 16.19 (H) 0.00 - 0.50 ug/mL-FEU    Comment: (NOTE) At the manufacturer cut-off of 0.50 ug/mL FEU, this assay has been documented to exclude PE with a sensitivity and negative predictive value of 97 to 99%.  At this time, this assay has not been approved by the FDA to exclude DVT/VTE. Results should be correlated with clinical presentation. Performed at [facility], [place] [address] [place] [address], [place], [place] [address]   Lactic acid, plasma     Status: None   Collection Time: 04/12/19  9:16 PM  Result Value Ref Range   Lactic Acid, Venous 1.1 0.5 - 1.9 mmol/L    Comment: Performed at [facility], [place] [address] [place], [place] [address]    Chemistries  Recent Labs  Lab 04/12/19 1953  NA 141  K 3.9  CL 104  CO2 22  GLUCOSE 98  BUN 83*  CREATININE 8.08*  CALCIUM 8.6*  AST 14*  ALT 9  ALKPHOS 82  BILITOT 0.6    ------------------------------------------------------------------------------------------------------------------  ------------------------------------------------------------------------------------------------------------------ GFR: Estimated Creatinine Clearance: 9 mL/min (A) (by C-G formula based on SCr of 8.08 mg/dL (H)). Liver Function Tests: Recent Labs  Lab 04/12/19 1953  AST 14*  ALT 9  ALKPHOS 82  BILITOT 0.6  PROT 7.1  ALBUMIN 3.5   Recent Labs  Lab 04/12/19 1953  LIPASE 59*   No results for input(s): AMMONIA in the last 168 hours. Coagulation Profile: No results for input(s): INR, PROTIME in the last 168 hours. Cardiac Enzymes: No results for input(s): CKTOTAL, CKMB, CKMBINDEX, TROPONINI in the last 168 hours. BNP (last 3 results) No results for input(s): PROBNP in the last 8760 hours. HbA1C: No results for input(s): HGBA1C in the last 72 hours. CBG: No results for input(s): GLUCAP in the last 168 hours. Lipid Profile: No results for input(s): CHOL, HDL, LDLCALC, TRIG, CHOLHDL, LDLDIRECT in the last 72 hours. Thyroid Function Tests: No results for input(s): TSH, T4TOTAL, FREET4, T3FREE, THYROIDAB in the last 72 hours. Anemia Panel: No results for input(s): VITAMINB12, FOLATE, FERRITIN, TIBC, IRON, RETICCTPCT in the last 72 hours.  --------------------------------------------------------------------------------------------------------------- Urine analysis:    Component Value Date/Time   COLORURINE YELLOW 10/03/2018 2332   APPEARANCEUR HAZY (A) 10/03/2018 2332   LABSPEC 1.014 10/03/2018 2332   PHURINE 5.0 10/03/2018 2332   GLUCOSEU NEGATIVE 10/03/2018 2332   HGBUR SMALL (A) 10/03/2018 2332   BILIRUBINUR NEGATIVE 10/03/2018 2332   KETONESUR NEGATIVE 10/03/2018 2332   PROTEINUR 100 (A) 10/03/2018 2332   NITRITE NEGATIVE 10/03/2018 2332   LEUKOCYTESUR NEGATIVE 10/03/2018 2332      Imaging Results:    DG Chest 2 View  Result Date:  04/12/2019 CLINICAL DATA:  Chest pain, shortness of breath, dialysis EXAM: CHEST - 2 VIEW COMPARISON:  10/04/2018 FINDINGS: Cardiomegaly. Moderate to large right pleural effusion. Small left pleural effusion. The visualized skeletal structures are unremarkable. IMPRESSION: Cardiomegaly with moderate to large right pleural effusion and small left pleural effusion. Electronically Signed   By: Eddie Candle M.D.   On: 04/12/2019 20:01   CT Chest Wo Contrast  Result Date: 04/12/2019 CLINICAL DATA:  Chest pain and shortness of breath. EXAM: CT CHEST WITHOUT CONTRAST TECHNIQUE: Multidetector CT imaging of the chest was performed following the standard protocol without IV contrast.  COMPARISON:  May 28, 2015 FINDINGS: Cardiovascular: No significant vascular findings. Normal heart size. There is a small pericardial effusion which represents a new finding when compared to the prior study. Mediastinum/Nodes: No enlarged mediastinal or axillary lymph nodes. Lungs/Pleura: A moderate sized area of consolidation is seen within the right lower lobe. A moderate size right pleural effusion is seen. A small left pleural effusion is also noted. There is no evidence of a pneumothorax. Upper Abdomen: A moderate amount of free fluid is seen within the upper abdomen. Musculoskeletal: No chest wall mass or suspicious bone lesions identified. IMPRESSION: 1. Moderate severity right lower lobe consolidation. While this is likely secondary to a combination of atelectasis and/or infiltrate, an underlying neoplastic process cannot be excluded. Follow-up to resolution is recommended. 2. Bilateral pleural effusions, right greater than left. 3. Small pericardial effusion which represents a new finding compared to the prior study dated May 28, 2015. 4. Moderate amount of intra-abdominal free fluid. Electronically Signed   By: Virgina Norfolk M.D.   On: 04/12/2019 22:09       Assessment & Plan:    Active Problems:   Pneumonia   CAP   Blood culture x2 Urine strep antigen Urine legionella antigen Rocephin 2gm iv qday Zithromax 500mg  iv qday  Leukocytosis to above CAP Check cbc in am  CP, D dimer + VQ scan ordered, please follow up  Trop I  Check cardiac echo as below  R> L pleural effusion Pericardial effusion (small) Check cardiac echo  ESRD on peritoneal dialysis Please consult nephrology  Check cmp in am  Anemia Check ferritin, iron, tibc, b12, folate Check cbc in am  Anxiety Cont Celexa 40mg  po qday  Hypertension Cont Labetalol 300mg  po bid Cont Procardia 60mg  po bid Cont Lasix 40mg  po qday  Lupus Cont Prednisone 20mg  po qday  DVT Prophylaxis-   Heparin  - SCDs   AM Labs Ordered, also please review Full Orders  Family Communication: Admission, patients condition and plan of care including tests being ordered have been discussed with the patient  who indicate understanding and agree with the plan and Code Status.  Code Status:  FULL CODE per patient,   Admission status: Inpatient: Based on patients clinical presentation and evaluation of above clinical data, I have made determination that patient meets Inpatient criteria at this time.  Pt has pneumonia with effusions, and has high risk of clinical deterioration,    Time spent in minutes : 55 minutes   Jani Gravel M.D on 04/12/2019 at 11:10 PM

## 2019-04-12 NOTE — ED Triage Notes (Signed)
Patient does home dialysis every day. Patient is having chest pain and sob x 2 days. Patient hx lupus. Stage 4 kidney disease. Patient states that she her dialysis access is in her belly.

## 2019-04-12 NOTE — ED Provider Notes (Signed)
[facility] DEPT Provider Note   CSN: [id number] Arrival date & time: 04/12/19  1937     History Chief Complaint  Patient presents with  . Chest Pain    Latasha Drake is a 39 y.o. female.  HPI Patient reports she started developing chest pain 2 days ago.  She reports has been quite severe but she has been trying to deal with that at home.  Pain is toward the top of her chest on the right side.  Very sharp and uncomfortable.  She feels very short of breath.  She reports the pain is worse if she leans very far forward.  She denies she has had cough or fever.  No generalized body aches.  She denies she is having significant swelling down the legs.  No calf pain specifically.  Patient does home peritoneal dialysis.  She reports that it is going well and she does not feel that she is having difficulty dialyzing.  She denies any abdominal pain.  She reports she is never had similar pain before.    Past Medical History:  Diagnosis Date  . Anemia   . Chronic kidney disease   . Hypertension   . Lupus [facility])     Patient Active Problem List   Diagnosis Date Noted  . Acute on chronic blood loss anemia 10/04/2018  . Symptomatic anemia 10/04/2018  . Lupus ([place])   . Hypertension   . Chronic kidney disease   . Acute renal failure superimposed on chronic kidney disease ([place])   . Elevated d-dimer   . Pericardial effusion   . Traumatic perinephric hematoma of left kidney     Past Surgical History:  Procedure Laterality Date  . CESAREAN SECTION    . HERNIA REPAIR       OB History   No obstetric history on file.     Family History  Problem Relation Age of Onset  . Lupus Other        cousin    Social History   Tobacco Use  . Smoking status: Current Every Day Smoker    Packs/day: 0.50    Types: Cigarettes  . Smokeless tobacco: Never Used  Substance Use Topics  . Alcohol use: Yes    Comment: occassionally  . Drug use: No    Home Medications  Prior to Admission medications   Medication Sig Start Date End Date Taking? Authorizing Provider  citalopram (CELEXA) 40 MG tablet Take 40 mg by mouth daily.  10/27/16 10/04/18  [provider]  labetalol (NORMODYNE) 100 MG tablet Take 100 mg by mouth 2 (two) times daily.  11/17/16   [provider]  NIFEdipine (PROCARDIA-XL/ADALAT CC) 60 MG 24 hr tablet Take 60 mg by mouth daily. 12/03/16   [provider]  ondansetron (ZOFRAN) 4 MG tablet Take 1 tablet (4 mg total) by mouth every 8 (eight) hours as needed for nausea or vomiting. Patient not taking: Reported on 12/30/2016 05/28/15   Mackuen, Courteney Lyn, MD  pantoprazole (PROTONIX) 40 MG tablet Take 40 mg by mouth daily as needed (indigestion).  10/27/16   [provider]  paragard intrauterine copper IUD IUD 1 Intra Uterine Device by Intrauterine route once.    [provider]  sucralfate (CARAFATE) 1 GM/10ML suspension Take 1 g by mouth daily as needed (indigestion).  10/27/16   [provider]  Vitamin D, Ergocalciferol, (DRISDOL) 50000 units CAPS capsule Take 50,000 Units by mouth every Sunday. Take on Sun. 06/23/15   [provider]    Allergies    Lisinopril  Review of Systems   Review of Systems 10 Systems reviewed and are negative for acute change except as noted in the HPI. Physical Exam Updated Vital Signs BP (!) 168/111 (BP Location: Right Arm)   Pulse 98   Temp 98.3 F (36.8 C) (Oral)   Resp (!) 30   Ht 5\' 4"  (1.626 m)   Wt 72.6 kg   LMP 02/24/2019   SpO2 97%   BMI 27.46 kg/m   Physical Exam Constitutional:      Comments: Patient is alert and nontoxic but appears to be in significant pain.  She is tachypneic.  HENT:     Head: Normocephalic and atraumatic.  Eyes:     Extraocular Movements: Extraocular movements intact.     Conjunctiva/sclera: Conjunctivae normal.  Cardiovascular:     Comments: Heart is borderline tachycardic.  I do not appreciate any rub  or gallop. Pulmonary:     Comments: Breath sounds are significantly decreased at the right midlung field to base.  Breath sounds are clear of the upper lung field.  Breath sounds grossly clear on the left. Abdominal:     Comments: Abdomen is slightly protuberant.  It is soft and nontender.  Patient is dialysis tubing site on right lower abdomen is clean and dry.  No drainage discharge or redness.  Musculoskeletal:        General: No swelling or tenderness. Normal range of motion.     Cervical back: Neck supple.     Right lower leg: No edema.     Left lower leg: No edema.     Comments: Calves nontender to palpation.  Skin:    General: Skin is warm and dry.  Neurological:     General: No focal deficit present.     Mental Status: She is oriented to person, place, and time.     Coordination: Coordination normal.     ED Results / Procedures / Treatments   Labs (all labs ordered are listed, but only abnormal results are displayed) Labs Reviewed  CBC - Abnormal; Notable for the following components:      Result Value   WBC 14.7 (*)    Hemoglobin 9.2 (*)    HCT 27.5 (*)    MCV 69.6 (*)    MCH 23.3 (*)    RDW 17.2 (*)    Platelets 523 (*)    All other components within normal limits  BASIC METABOLIC PANEL  D-DIMER, QUANTITATIVE (NOT AT [facility])  I-STAT BETA HCG BLOOD, ED ([facility], [facility], [facility] ONLY)  TROPONIN I (HIGH SENSITIVITY)    EKG EKG Interpretation  Date/Time:  Friday April 12 2019 20:59:25 EST Ventricular Rate:  94 PR Interval:    QRS Duration: 96 QT Interval:  372 QTC Calculation: 466 R Axis:   66 Text Interpretation: Sinus rhythm Nonspecific ST depression ST elevation, consider lateral injury No significant change since last tracing Confirmed by Deno Etienne [phone]) on 04/13/2019 12:36:23 PM   Radiology DG Chest 2 View  Result Date: 04/12/2019 CLINICAL DATA:  Chest pain, shortness of breath, dialysis EXAM: CHEST - 2 VIEW COMPARISON:  10/04/2018 FINDINGS: Cardiomegaly.  Moderate to large right pleural effusion. Small left pleural effusion. The visualized skeletal structures are unremarkable. IMPRESSION: Cardiomegaly with moderate to large right pleural effusion and small left pleural effusion. Electronically Signed   By: Eddie Candle M.D.   On: 04/12/2019 20:01    Procedures Procedures (including critical care time)  Medications Ordered in ED Medications  sodium chloride flush (NS) 0.9 % injection 3 mL (has no administration in time range)    ED Course  I have reviewed the triage vital signs and the nursing notes.  Pertinent labs & imaging results that were available during my care of the patient were reviewed by me and considered in my medical decision making (see chart for details).    MDM Rules/Calculators/A&P                      Patient presents with severe right chest pain.  X-ray shows a pleural effusion.  CT scan obtained for further diagnostic information.  Combined appearance of atelectasis and/or infiltrate on the right.  Will initiate treatment for community-acquired pneumonia.  Patient has significant comorbid conditions of lupus and peritoneal dialysis.  Admit for further medical management and diagnostic evaluation. Final Clinical Impression(s) / ED Diagnoses Final diagnoses:  Pleural effusion on right  Other chest pain  Severe comorbid illness    Rx / DC Orders ED Discharge Orders    None       Charlesetta Shanks, MD 04/15/19 [phone]

## 2019-04-12 NOTE — ED Notes (Signed)
Pt transported to CT ?

## 2019-04-13 ENCOUNTER — Inpatient Hospital Stay (HOSPITAL_COMMUNITY): Payer: Medicaid Other

## 2019-04-13 ENCOUNTER — Other Ambulatory Visit: Payer: Self-pay

## 2019-04-13 ENCOUNTER — Encounter (HOSPITAL_COMMUNITY): Payer: Self-pay | Admitting: Internal Medicine

## 2019-04-13 DIAGNOSIS — R079 Chest pain, unspecified: Secondary | ICD-10-CM | POA: Diagnosis present

## 2019-04-13 DIAGNOSIS — I313 Pericardial effusion (noninflammatory): Secondary | ICD-10-CM

## 2019-04-13 DIAGNOSIS — I3139 Other pericardial effusion (noninflammatory): Secondary | ICD-10-CM

## 2019-04-13 DIAGNOSIS — Z992 Dependence on renal dialysis: Secondary | ICD-10-CM | POA: Diagnosis present

## 2019-04-13 DIAGNOSIS — N186 End stage renal disease: Secondary | ICD-10-CM | POA: Diagnosis present

## 2019-04-13 DIAGNOSIS — I10 Essential (primary) hypertension: Secondary | ICD-10-CM

## 2019-04-13 DIAGNOSIS — I272 Pulmonary hypertension, unspecified: Secondary | ICD-10-CM

## 2019-04-13 HISTORY — DX: Pulmonary hypertension, unspecified: I27.20

## 2019-04-13 HISTORY — DX: Pericardial effusion (noninflammatory): I31.3

## 2019-04-13 HISTORY — DX: Other pericardial effusion (noninflammatory): I31.39

## 2019-04-13 LAB — PHOSPHORUS: Phosphorus: 5.4 mg/dL — ABNORMAL HIGH (ref 2.5–4.6)

## 2019-04-13 LAB — CBC
HCT: 26.9 % — ABNORMAL LOW (ref 36.0–46.0)
Hemoglobin: 8.6 g/dL — ABNORMAL LOW (ref 12.0–15.0)
MCH: 22.6 pg — ABNORMAL LOW (ref 26.0–34.0)
MCHC: 32 g/dL (ref 30.0–36.0)
MCV: 70.8 fL — ABNORMAL LOW (ref 80.0–100.0)
Platelets: 479 10*3/uL — ABNORMAL HIGH (ref 150–400)
RBC: 3.8 MIL/uL — ABNORMAL LOW (ref 3.87–5.11)
RDW: 17.5 % — ABNORMAL HIGH (ref 11.5–15.5)
WBC: 12.6 10*3/uL — ABNORMAL HIGH (ref 4.0–10.5)
nRBC: 0 % (ref 0.0–0.2)

## 2019-04-13 LAB — TROPONIN I (HIGH SENSITIVITY): Troponin I (High Sensitivity): 8 ng/L (ref <18)

## 2019-04-13 LAB — ECHOCARDIOGRAM COMPLETE
Height: 64 in
Weight: 2560 oz

## 2019-04-13 MED ORDER — TECHNETIUM TO 99M ALBUMIN AGGREGATED
1.6400 | Freq: Once | INTRAVENOUS | Status: AC | PRN
  Administered 2019-04-13: 1.64 via INTRAVENOUS

## 2019-04-13 MED ORDER — PREDNISONE 20 MG PO TABS
20.0000 mg | ORAL_TABLET | Freq: Every day | ORAL | Status: DC
  Administered 2019-04-13 – 2019-04-14 (×2): 20 mg via ORAL
  Filled 2019-04-13 (×2): qty 1

## 2019-04-13 MED ORDER — SODIUM CHLORIDE 0.9 % IV SOLN
1.0000 g | INTRAVENOUS | Status: DC
  Filled 2019-04-13: qty 10

## 2019-04-13 MED ORDER — HYDRALAZINE HCL 25 MG PO TABS
25.0000 mg | ORAL_TABLET | Freq: Three times a day (TID) | ORAL | Status: DC
  Administered 2019-04-13 – 2019-04-14 (×3): 25 mg via ORAL
  Filled 2019-04-13 (×3): qty 1

## 2019-04-13 MED ORDER — PRO-STAT SUGAR FREE PO LIQD
30.0000 mL | Freq: Two times a day (BID) | ORAL | Status: DC
  Administered 2019-04-13 – 2019-04-14 (×3): 30 mL via ORAL
  Filled 2019-04-13 (×3): qty 30

## 2019-04-13 MED ORDER — SODIUM CHLORIDE 0.9 % IV SOLN
1.0000 g | Freq: Once | INTRAVENOUS | Status: AC
  Administered 2019-04-14: 1 g via INTRAVENOUS
  Filled 2019-04-13: qty 10

## 2019-04-13 MED ORDER — LABETALOL HCL 300 MG PO TABS
300.0000 mg | ORAL_TABLET | Freq: Two times a day (BID) | ORAL | Status: DC
  Administered 2019-04-13 – 2019-04-14 (×2): 300 mg via ORAL
  Filled 2019-04-13 (×2): qty 1

## 2019-04-13 MED ORDER — NIFEDIPINE ER OSMOTIC RELEASE 60 MG PO TB24
60.0000 mg | ORAL_TABLET | Freq: Two times a day (BID) | ORAL | Status: DC
  Administered 2019-04-13 – 2019-04-14 (×4): 60 mg via ORAL
  Filled 2019-04-13 (×4): qty 1

## 2019-04-13 MED ORDER — HYDROCODONE-ACETAMINOPHEN 5-325 MG PO TABS
1.0000 | ORAL_TABLET | Freq: Four times a day (QID) | ORAL | Status: DC | PRN
  Administered 2019-04-13 – 2019-04-14 (×4): 2 via ORAL
  Filled 2019-04-13 (×4): qty 2

## 2019-04-13 MED ORDER — ONDANSETRON HCL 4 MG PO TABS
4.0000 mg | ORAL_TABLET | Freq: Three times a day (TID) | ORAL | Status: DC | PRN
  Administered 2019-04-13: 4 mg via ORAL
  Filled 2019-04-13: qty 1

## 2019-04-13 MED ORDER — HYDRALAZINE HCL 20 MG/ML IJ SOLN
5.0000 mg | Freq: Four times a day (QID) | INTRAMUSCULAR | Status: DC | PRN
  Administered 2019-04-13 (×2): 5 mg via INTRAVENOUS
  Filled 2019-04-13 (×2): qty 1

## 2019-04-13 MED ORDER — VITAMIN D (ERGOCALCIFEROL) 1.25 MG (50000 UNIT) PO CAPS
50000.0000 [IU] | ORAL_CAPSULE | ORAL | Status: DC
  Administered 2019-04-14: 50000 [IU] via ORAL
  Filled 2019-04-13: qty 1

## 2019-04-13 MED ORDER — FUROSEMIDE 40 MG PO TABS
40.0000 mg | ORAL_TABLET | Freq: Every day | ORAL | Status: DC
  Administered 2019-04-13 – 2019-04-14 (×2): 40 mg via ORAL
  Filled 2019-04-13 (×2): qty 1

## 2019-04-13 MED ORDER — ACETAMINOPHEN 325 MG PO TABS: 650.0000 mg | ORAL_TABLET | Freq: Four times a day (QID) | ORAL | Status: DC | PRN

## 2019-04-13 MED ORDER — AMLODIPINE BESYLATE 5 MG PO TABS: 5.0000 mg | ORAL_TABLET | Freq: Every day | ORAL | Status: DC

## 2019-04-13 MED ORDER — PANTOPRAZOLE SODIUM 40 MG PO TBEC: 40.0000 mg | DELAYED_RELEASE_TABLET | Freq: Every day | ORAL | Status: DC | PRN

## 2019-04-13 MED ORDER — CYCLOBENZAPRINE HCL 10 MG PO TABS
10.0000 mg | ORAL_TABLET | Freq: Three times a day (TID) | ORAL | Status: DC | PRN
  Administered 2019-04-13: 10 mg via ORAL
  Filled 2019-04-13: qty 1

## 2019-04-13 MED ORDER — SODIUM CHLORIDE 0.9 % IV SOLN
500.0000 mg | INTRAVENOUS | Status: DC
  Administered 2019-04-14: 500 mg via INTRAVENOUS
  Filled 2019-04-13: qty 500

## 2019-04-13 MED ORDER — CITALOPRAM HYDROBROMIDE 40 MG PO TABS
40.0000 mg | ORAL_TABLET | Freq: Every day | ORAL | Status: DC
  Administered 2019-04-13 – 2019-04-14 (×2): 40 mg via ORAL
  Filled 2019-04-13 (×2): qty 1

## 2019-04-13 NOTE — Progress Notes (Addendum)
[facility]                                                                              Patient Demographics  Latasha Drake, is a 39 y.o. female, DOB - [birth year], CX:[id number]  Admit date - 04/12/2019   Admitting Physician Jani Gravel, MD  Outpatient Primary MD for the patient is [facility] [place] (Inactive)  Outpatient specialists:   LOS - 1  days    Chief Complaint  Patient presents with  . 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 Patient admitted to hospitalist service for pneumonia  Assessment & Plan    Principal Problem:   Pneumonia Active Problems:   Hypertension   Pericardial effusion   Chest pain   ESRD (end stage renal disease) ([place])  Community acquired pneumonia Leukocytosis noted -cannot rule out infectious etiology, patient on chronic steroid therapy with prednisone Infiltrate.  Chest CT on admission Antibiotics initiated on admission with Zithromax and Rocephin Blood culture x2 Urine Legionella antigen and strep antigen follow-up  Right more than left pleural effusion History of chest pain on presentation-troponin negative for acute coronary syndrome echocardiogram ordered on admission-follow  Hypertension Continue home calcium blocker with as needed hydralazine IV  Adjust regimen as needed for better blood pressure control  End-stage renal disease on peritoneal hemodialysis Monitor renal function with electrolytes Nephrology consulted  Anemia Likely due to chronic disease Follow-up anemia studies  Lupus On chronic steroid Continue prednisone 20 mg daily     Code Status: Full code DVT Prophylaxis:  Lovenox heparin Family Communication: Discussed in detail with the  patient, all imaging results, lab results explained to the patient    Disposition Plan: Home  Time Spent in minutes 35 minutes  Procedures:    Consultants:    Antimicrobials:      Medications  Scheduled Meds: . citalopram  40 mg Oral Daily  . furosemide  40 mg Oral Daily  . heparin  5,000 Units Subcutaneous Q8H  . [START ON 04/14/2019] labetalol  300 mg Oral BID  . NIFEdipine  60 mg Oral BID  . predniSONE  20 mg Oral Daily  . sodium chloride flush  3 mL Intravenous Once  . [START ON 04/14/2019] Vitamin D (Ergocalciferol)  50,000 Units Oral Q Sun   Continuous Infusions: PRN Meds:.acetaminophen, cyclobenzaprine, hydrALAZINE, HYDROcodone-acetaminophen, ondansetron, pantoprazole   Antibiotics   Anti-infectives (From admission, onward)   Start     Dose/Rate Route Frequency Ordered Stop   04/12/19 2230  cefTRIAXone (ROCEPHIN) 1 g in sodium chloride 0.9 % 100 mL IVPB     1 g 200 mL/hr over 30 Minutes Intravenous  Once 04/12/19 2229 04/13/19 0203   04/12/19 2230  azithromycin (ZITHROMAX) 500 mg in sodium chloride 0.9 % 250 mL IVPB     500 mg 250 mL/hr over 60 Minutes Intravenous  Once 04/12/19 2229 04/13/19 0030        Subjective:  Latasha Drake was seen and examined today.  Chest pain better, admits to chills without fever.  No acute events overnight.    Objective:   Vitals:   04/13/19 0045 04/13/19 0232 04/13/19 0312 04/13/19 0514  BP: (!) 151/106 (!) 177/130 (!) 179/110 (!) 181/109  Pulse: 91  92 95  Resp: (!) 23  18 18   Temp:   98.5 F (36.9 C) 98.2 F (36.8 C)  TempSrc:   Oral Oral  SpO2: 96%  100% 97%  Weight:      Height:        Intake/Output Summary (Last 24 hours) at 04/13/2019 1020 Last data filed at 04/13/2019 1001 Gross per 24 hour  Intake 719.32 ml  Output 200 ml  Net 519.32 ml     Wt Readings from Last 3 Encounters:  04/12/19 72.6 kg  10/05/18 68 kg  09/05/14 68 kg     Exam  General: NAD  HEENT: NCAT,  PERRL,MMM  Neck: SUPPLE, (-)  JVD  Cardiovascular: RRR, (-) GALLOP, (-) MURMUR  Respiratory: Diminished breath sounds with right rhonchorous breath sounds noted  Gastrointestinal: SOFT, (-) DISTENSION, BS(+), (_) TENDERNESS  Ext: (-) CYANOSIS, (+) EDEMA  Neuro: A, OX 3  Skin:(-) RASH  Psych:NORMAL AFFECT/MOOD   Data Reviewed:  I have personally reviewed following labs and imaging studies  Micro Results Recent Results (from the past 240 hour(s))  Respiratory Panel by RT PCR (Flu A&B, Covid) - Nasopharyngeal Swab     Status: None   Collection Time: 04/12/19  9:16 PM   Specimen: Nasopharyngeal Swab  Result Value Ref Range Status   SARS Coronavirus 2 by RT PCR NEGATIVE NEGATIVE Final    Comment: (NOTE) SARS-CoV-2 target nucleic acids are NOT DETECTED. The SARS-CoV-2 RNA is generally detectable in upper respiratoy specimens during the acute phase of infection. The lowest concentration of SARS-CoV-2 viral copies this assay can detect is 131 copies/mL. A negative result does not preclude SARS-Cov-2 infection and should not be used as the sole basis for treatment or other patient management decisions. A negative result may occur with  improper specimen collection/handling, submission of specimen other than nasopharyngeal swab, presence of viral mutation(s) within the areas targeted by this assay, and inadequate number of viral copies (<131 copies/mL). A negative result must be combined with clinical observations, patient history, and epidemiological information. The expected result is Negative. Fact Sheet for Patients:  PinkCheek.be Fact Sheet for Healthcare Providers:  GravelBags.it This test is not yet ap proved or cleared by the [place] FDA and  has been authorized for detection and/or diagnosis of SARS-CoV-2 by FDA under an Emergency Use Authorization (EUA). This EUA will remain  in effect (meaning this test can be used) for the duration of  the COVID-19 declaration under Section 564(b)(1) of the Act, 21 U.S.C. section 360bbb-3(b)(1), unless the authorization is terminated or revoked sooner.    Influenza A by PCR NEGATIVE NEGATIVE Final   Influenza B by PCR NEGATIVE NEGATIVE Final    Comment: (NOTE) The Xpert Xpress SARS-CoV-2/FLU/RSV assay is intended as an aid in  the diagnosis of influenza from Nasopharyngeal swab specimens and  should not be used as a sole basis for treatment. Nasal washings and  aspirates are unacceptable for Xpert Xpress SARS-CoV-2/FLU/RSV  testing. Fact Sheet for Patients: PinkCheek.be Fact Sheet for Healthcare Providers: GravelBags.it This test is not yet approved or cleared by the [place] FDA and  has been authorized for detection and/or diagnosis of SARS-CoV-2 by  FDA under  an Emergency Use Authorization (EUA). This EUA will remain  in effect (meaning this test can be used) for the duration of the  Covid-19 declaration under Section 564(b)(1) of the Act, 21  U.S.C. section 360bbb-3(b)(1), unless the authorization is  terminated or revoked. Performed at [facility] [place] [address], [place], [place] [address]   Culture, blood (routine x 2)     Status: None (Preliminary result)   Collection Time: 04/12/19  9:16 PM   Specimen: BLOOD RIGHT FOREARM  Result Value Ref Range Status   Specimen Description   Final    BLOOD RIGHT FOREARM Performed at [facility] [address], [place], [place] [address]    Special Requests   Final    BOTTLES DRAWN AEROBIC AND ANAEROBIC Blood Culture adequate volume Performed at [facility] [address], [place], [place] [address]    Culture   Final    NO GROWTH < 12 HOURS Performed at [facility] [address], [place], [place] [address]    Report Status PENDING  Incomplete  Culture, blood (routine x 2)     Status: None  (Preliminary result)   Collection Time: 04/12/19  9:16 PM   Specimen: BLOOD  Result Value Ref Range Status   Specimen Description   Final    BLOOD LEFT ANTECUBITAL Performed at [facility] [address], [place], [place] [address]    Special Requests   Final    BOTTLES DRAWN AEROBIC AND ANAEROBIC Blood Culture adequate volume Performed at [facility] [address], [place], [place] [address]    Culture   Final    NO GROWTH < 12 HOURS Performed at [facility] [address], [place], [place] [address]    Report Status PENDING  Incomplete    Radiology Reports DG Chest 2 View  Result Date: 04/12/2019 CLINICAL DATA:  Chest pain, shortness of breath, dialysis EXAM: CHEST - 2 VIEW COMPARISON:  10/04/2018 FINDINGS: Cardiomegaly. Moderate to large right pleural effusion. Small left pleural effusion. The visualized skeletal structures are unremarkable. IMPRESSION: Cardiomegaly with moderate to large right pleural effusion and small left pleural effusion. Electronically Signed   By: Eddie Candle M.D.   On: 04/12/2019 20:01   CT Chest Wo Contrast  Result Date: 04/12/2019 CLINICAL DATA:  Chest pain and shortness of breath. EXAM: CT CHEST WITHOUT CONTRAST TECHNIQUE: Multidetector CT imaging of the chest was performed following the standard protocol without IV contrast. COMPARISON:  May 28, 2015 FINDINGS: Cardiovascular: No significant vascular findings. Normal heart size. There is a small pericardial effusion which represents a new finding when compared to the prior study. Mediastinum/Nodes: No enlarged mediastinal or axillary lymph nodes. Lungs/Pleura: A moderate sized area of consolidation is seen within the right lower lobe. A moderate size right pleural effusion is seen. A small left pleural effusion is also noted. There is no evidence of a pneumothorax. Upper Abdomen: A moderate amount of free fluid is seen within the upper abdomen.  Musculoskeletal: No chest wall mass or suspicious bone lesions identified. IMPRESSION: 1. Moderate severity right lower lobe consolidation. While this is likely secondary to a combination of atelectasis and/or infiltrate, an underlying neoplastic process cannot be excluded. Follow-up to resolution is recommended. 2. Bilateral pleural effusions, right greater than left. 3. Small pericardial effusion which represents a new finding compared to the prior study dated May 28, 2015. 4. Moderate amount of intra-abdominal free fluid. Electronically Signed  By: Virgina Norfolk M.D.   On: 04/12/2019 22:09   NM Pulmonary Perfusion  Result Date: 04/13/2019 CLINICAL DATA:  39 year old female with chest pain and shortness of breath. EXAM: NUCLEAR MEDICINE PERFUSION LUNG SCAN TECHNIQUE: Perfusion images were obtained in multiple projections after intravenous injection of radiopharmaceutical. Ventilation scans intentionally deferred if perfusion scan and chest x-ray adequate for interpretation during COVID 19 epidemic. RADIOPHARMACEUTICALS:  1.64 mCi Tc-27m MAA IV COMPARISON:  04/12/2019 chest CT and chest radiograph FINDINGS: Very poor perfusion to the RIGHT LOWER lung is noted compatible with consolidation noted on recent CT. Slightly decreased activity of the remainder of the RIGHT lung is noted, specially on posterior views compatible with pleural effusion as recently identified. No other perfusion abnormalities are noted. IMPRESSION: Indeterminate probability for pulmonary embolus (20-79%), but given recent CT and chest x-ray findings, on the LOWER end of this range. Electronically Signed   By: Margarette Canada M.D.   On: 04/13/2019 09:04    Lab Data:  CBC: Recent Labs  Lab 04/12/19 1953  WBC 14.7*  HGB 9.2*  HCT 27.5*  MCV 69.6*  PLT 0000000*   Basic Metabolic Panel: Recent Labs  Lab 04/12/19 1953  NA 141  K 3.9  CL 104  CO2 22  GLUCOSE 98  BUN 83*  CREATININE 8.08*  CALCIUM 8.6*   GFR: Estimated  Creatinine Clearance: 9 mL/min (A) (by C-G formula based on SCr of 8.08 mg/dL (H)). Liver Function Tests: Recent Labs  Lab 04/12/19 1953  AST 14*  ALT 9  ALKPHOS 82  BILITOT 0.6  PROT 7.1  ALBUMIN 3.5   Recent Labs  Lab 04/12/19 1953  LIPASE 59*   No results for input(s): AMMONIA in the last 168 hours. Coagulation Profile: No results for input(s): INR, PROTIME in the last 168 hours. Cardiac Enzymes: No results for input(s): CKTOTAL, CKMB, CKMBINDEX, TROPONINI in the last 168 hours. BNP (last 3 results) No results for input(s): PROBNP in the last 8760 hours. HbA1C: No results for input(s): HGBA1C in the last 72 hours. CBG: No results for input(s): GLUCAP in the last 168 hours. Lipid Profile: No results for input(s): CHOL, HDL, LDLCALC, TRIG, CHOLHDL, LDLDIRECT in the last 72 hours. Thyroid Function Tests: No results for input(s): TSH, T4TOTAL, FREET4, T3FREE, THYROIDAB in the last 72 hours. Anemia Panel: No results for input(s): VITAMINB12, FOLATE, FERRITIN, TIBC, IRON, RETICCTPCT in the last 72 hours. Urine analysis:    Component Value Date/Time   COLORURINE YELLOW 10/03/2018 2332   APPEARANCEUR HAZY (A) 10/03/2018 2332   LABSPEC 1.014 10/03/2018 2332   PHURINE 5.0 10/03/2018 2332   GLUCOSEU NEGATIVE 10/03/2018 2332   HGBUR SMALL (A) 10/03/2018 2332   BILIRUBINUR NEGATIVE 10/03/2018 [place] 10/03/2018 2332   PROTEINUR 100 (A) 10/03/2018 2332   NITRITE NEGATIVE 10/03/2018 2332   LEUKOCYTESUR NEGATIVE 10/03/2018 2332     Benito Mccreedy M.D. [facility] 04/13/2019, 10:20 AM  Pager: [phone] Between 7am to 7pm - call Pager - [phone]  After 7pm go to [email or web address] - password [email or web address]  Call night coverage person covering after 7pm

## 2019-04-13 NOTE — Plan of Care (Signed)
  Problem: Clinical Measurements: Goal: Ability to maintain clinical measurements within normal limits will improve Outcome: Progressing   

## 2019-04-13 NOTE — Progress Notes (Signed)
*  PRELIMINARY RESULTS* Echocardiogram 2D Echocardiogram has been performed.  Leavy Cella 04/13/2019, 11:34 AM

## 2019-04-13 NOTE — Progress Notes (Signed)
Report received. Room ready.  

## 2019-04-13 NOTE — Consult Note (Signed)
[facility]  INPATIENT CONSULTATION  Reason for Consultation: ESRD on PD Requesting Provider: Dr. Jani Gravel  HPI: Latasha Drake is an 39 y.o. 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   She is admitted for pleuritic CP with LLL infiltrate c/f PNA being treated for CAP with CTX/Azithro.  COVID neg.  Afebrile but WBC 14.7.  Cough but no fevers or chills.   She follows with Dr. Neta Ehlers and had PD catheter placed by Dr. Raul Del 12/15 and subsequently trained on PD.  After starting cycler had lots of fill and drain pain so underwent laproscopic revision end of 03/2019.  Since then has been healing and doing CAPD with 4 exchange, 2L fill, 2.5%.  She had edema prior to starting PD but not really much now.  UOP ~816mL/day.  No orthonpea.  No constipation, cloudy fluid or breaches in technique.    Says BPs always high - 200 is not unusual for her.  Sees Rheum in [place].  On pred 20 daily tapering down.  Doesn't feel like lupus is flaring.  Didn't tolerate MMF.  Doesn't take her plaquenil.  PMH: Past Medical History:  Diagnosis Date  . Anemia   . Chronic kidney disease   . Hypertension   . Lupus ([place])    PSH: Past Surgical History:  Procedure Laterality Date  . CESAREAN SECTION    . HERNIA REPAIR      Past Medical History:  Diagnosis Date  . Anemia   . Chronic kidney disease   . Hypertension   . Lupus ([place])     Medications:  I have reviewed the patient's current medications.  Medications Prior to Admission  Medication Sig Dispense Refill  . citalopram (CELEXA) 40 MG tablet Take 40 mg by mouth daily.    . cyclobenzaprine (FLEXERIL) 10 MG tablet Take 10 mg by mouth 3 (three) times daily as needed for muscle spasms.     . furosemide (LASIX) 40 MG tablet Take 40 mg by mouth daily.    Marland Kitchen labetalol (NORMODYNE) 300 MG tablet Take 300 mg by mouth 2 (two) times daily.    Marland Kitchen NIFEdipine  (PROCARDIA XL/NIFEDICAL XL) 60 MG 24 hr tablet Take 60 mg by mouth 2 (two) times daily.    . ondansetron (ZOFRAN) 4 MG tablet Take 1 tablet (4 mg total) by mouth every 8 (eight) hours as needed for nausea or vomiting. 11 tablet 0  . pantoprazole (PROTONIX) 40 MG tablet Take 40 mg by mouth daily as needed (indigestion).   5  . paragard intrauterine copper IUD IUD 1 Intra Uterine Device by Intrauterine route once.    . predniSONE (DELTASONE) 20 MG tablet Take 20 mg by mouth daily.     . traMADol (ULTRAM) 50 MG tablet Take 50 mg by mouth every 6 (six) hours as needed for moderate pain.     . citalopram (CELEXA) 40 MG tablet Take 40 mg by mouth daily.     . Vitamin D, Ergocalciferol, (DRISDOL) 50000 units CAPS capsule Take 50,000 Units by mouth every Sunday. Take on Sun.      ALLERGIES:   Allergies  Allergen Reactions  . Lisinopril Swelling    FAM HX: Family History  Problem Relation Age of Onset  . Lupus Other        cousin    Social History:   reports that she has been smoking cigarettes. She has been  smoking about 0.50 packs per day. She has never used smokeless tobacco. She reports current alcohol use. She reports that she does not use drugs.  ROS: 12 system ROS per HPI  Blood pressure (!) 162/95, pulse (!) 105, temperature 98.2 F (36.8 C), temperature source Oral, resp. rate 18, height 5\' 4"  (1.626 m), weight 72.6 kg, last menstrual period 02/24/2019, SpO2 92 %. PHYSICAL EXAM: Gen: tired but nontoxic lying in bed  Eyes: anicteric ENT:MMM. No oral ulcers Neck: supple, no JVD CV:  Borderline tachy, no rub Abd: RLQ exit site c/d/i, no dressing in place; 4 incision from laproscopic surgery with steristrips c/d/i  Lungs: Dec BS bases, a few rhonchi R > L GU: no foley Extr:  Trace pedal edema; no synovitis Neuro: nonfocal Skin: warm and dry, no rashes   Results for orders placed or performed during the hospital encounter of 04/12/19 (from the past 48 hour(s))  Basic metabolic  panel     Status: Abnormal   Collection Time: 04/12/19  7:53 PM  Result Value Ref Range   Sodium 141 135 - 145 mmol/L   Potassium 3.9 3.5 - 5.1 mmol/L   Chloride 104 98 - 111 mmol/L   CO2 22 22 - 32 mmol/L   Glucose, Bld 98 70 - 99 mg/dL   BUN 83 (H) 6 - 20 mg/dL   Creatinine, Ser 8.08 (H) 0.44 - 1.00 mg/dL   Calcium 8.6 (L) 8.9 - 10.3 mg/dL   GFR calc non Af Amer 6 (L) >60 mL/min   GFR calc Af Amer 7 (L) >60 mL/min   Anion gap 15 5 - 15    Comment: Performed at [facility], [place] [address], [place], [place] [address]  CBC     Status: Abnormal   Collection Time: 04/12/19  7:53 PM  Result Value Ref Range   WBC 14.7 (H) 4.0 - 10.5 K/uL   RBC 3.95 3.87 - 5.11 MIL/uL   Hemoglobin 9.2 (L) 12.0 - 15.0 g/dL   HCT 27.5 (L) 36.0 - 46.0 %   MCV 69.6 (L) 80.0 - 100.0 fL   MCH 23.3 (L) 26.0 - 34.0 pg   MCHC 33.5 30.0 - 36.0 g/dL   RDW 17.2 (H) 11.5 - 15.5 %   Platelets 523 (H) 150 - 400 K/uL   nRBC 0.0 0.0 - 0.2 %    Comment: Performed at [facility], [place] [address], [place], [place] [address]  Troponin I (High Sensitivity)     Status: None   Collection Time: 04/12/19  7:53 PM  Result Value Ref Range   Troponin I (High Sensitivity) 8 <18 ng/L    Comment: (NOTE) Elevated high sensitivity troponin I (hsTnI) values and significant  changes across serial measurements may suggest ACS but many other  chronic and acute conditions are known to elevate hsTnI results.  Refer to the "Links" section for chest pain algorithms and additional  guidance. Performed at [facility], [place] [address], [place], [place] [address]   Hepatic function panel     Status: Abnormal   Collection Time: 04/12/19  7:53 PM  Result Value Ref Range   Total Protein 7.1 6.5 - 8.1 g/dL   Albumin 3.5 3.5 - 5.0 g/dL   AST 14 (L) 15 - 41 U/L   ALT 9 0 - 44 U/L   Alkaline Phosphatase 82 38 - 126 U/L   Total Bilirubin 0.6 0.3 - 1.2 mg/dL   Bilirubin, Direct <0.1  0.0 -  0.2 mg/dL   Indirect Bilirubin NOT CALCULATED 0.3 - 0.9 mg/dL    Comment: Performed at [facility], [place], [place] [address], [place], [place] [address]  Lipase, blood     Status: Abnormal   Collection Time: 04/12/19  7:53 PM  Result Value Ref Range   Lipase 59 (H) 11 - 51 U/L    Comment: Performed at [facility], [place] [address] [place], [place] [address]  Brain natriuretic peptide     Status: Abnormal   Collection Time: 04/12/19  7:53 PM  Result Value Ref Range   B Natriuretic Peptide 262.8 (H) 0.0 - 100.0 pg/mL    Comment: Performed at [facility], [place] [address] [place], [place] [address]  I-Stat beta hCG blood, ED     Status: None   Collection Time: 04/12/19  7:58 PM  Result Value Ref Range   I-stat hCG, quantitative <5.0 <5 mIU/mL   Comment 3            Comment:   GEST. AGE      CONC.  (mIU/mL)   <=1 WEEK        5 - 50     2 WEEKS       50 - 500     3 WEEKS       100 - 10,000     4 WEEKS     1,000 - 30,000        FEMALE AND NON-PREGNANT FEMALE:     LESS THAN 5 mIU/mL   D-dimer, quantitative (not at [facility])     Status: Abnormal   Collection Time: 04/12/19  7:59 PM  Result Value Ref Range   D-Dimer, Quant 16.19 (H) 0.00 - 0.50 ug/mL-FEU    Comment: (NOTE) At the manufacturer cut-off of 0.50 ug/mL FEU, this assay has been documented to exclude PE with a sensitivity and negative predictive value of 97 to 99%.  At this time, this assay has not been approved by the FDA to exclude DVT/VTE. Results should be correlated with clinical presentation. Performed at [facility], [place] [address] [place], [place] [address]   Respiratory Panel by RT PCR (Flu A&B, Covid) - Nasopharyngeal Swab     Status: None   Collection Time: 04/12/19  9:16 PM   Specimen: Nasopharyngeal Swab  Result Value Ref Range   SARS Coronavirus 2 by RT PCR NEGATIVE NEGATIVE    Comment: (NOTE) SARS-CoV-2 target nucleic acids are NOT  DETECTED. The SARS-CoV-2 RNA is generally detectable in upper respiratoy specimens during the acute phase of infection. The lowest concentration of SARS-CoV-2 viral copies this assay can detect is 131 copies/mL. A negative result does not preclude SARS-Cov-2 infection and should not be used as the sole basis for treatment or other patient management decisions. A negative result may occur with  improper specimen collection/handling, submission of specimen other than nasopharyngeal swab, presence of viral mutation(s) within the areas targeted by this assay, and inadequate number of viral copies (<131 copies/mL). A negative result must be combined with clinical observations, patient history, and epidemiological information. The expected result is Negative. Fact Sheet for Patients:  PinkCheek.be Fact Sheet for Healthcare Providers:  GravelBags.it This test is not yet ap proved or cleared by the [place] FDA and  has been authorized for detection and/or diagnosis of SARS-CoV-2 by FDA under an Emergency Use Authorization (EUA). This EUA will remain  in effect (meaning this test can be used) for the duration  of the COVID-19 declaration under Section 564(b)(1) of the Act, 21 U.S.C. section 360bbb-3(b)(1), unless the authorization is terminated or revoked sooner.    Influenza A by PCR NEGATIVE NEGATIVE   Influenza B by PCR NEGATIVE NEGATIVE    Comment: (NOTE) The Xpert Xpress SARS-CoV-2/FLU/RSV assay is intended as an aid in  the diagnosis of influenza from Nasopharyngeal swab specimens and  should not be used as a sole basis for treatment. Nasal washings and  aspirates are unacceptable for Xpert Xpress SARS-CoV-2/FLU/RSV  testing. Fact Sheet for Patients: PinkCheek.be Fact Sheet for Healthcare Providers: GravelBags.it This test is not yet approved or cleared by the  [place] FDA and  has been authorized for detection and/or diagnosis of SARS-CoV-2 by  FDA under an Emergency Use Authorization (EUA). This EUA will remain  in effect (meaning this test can be used) for the duration of the  Covid-19 declaration under Section 564(b)(1) of the Act, 21  U.S.C. section 360bbb-3(b)(1), unless the authorization is  terminated or revoked. Performed at [facility], [place] [address] [place], [place] [address]   Lactic acid, plasma     Status: None   Collection Time: 04/12/19  9:16 PM  Result Value Ref Range   Lactic Acid, Venous 1.1 0.5 - 1.9 mmol/L    Comment: Performed at [facility], [place] [address] [place], [place] [address]  Culture, blood (routine x 2)     Status: None (Preliminary result)   Collection Time: 04/12/19  9:16 PM   Specimen: BLOOD RIGHT FOREARM  Result Value Ref Range   Specimen Description      BLOOD RIGHT FOREARM Performed at [place] [address], [place], [place] [address]    Special Requests      BOTTLES DRAWN AEROBIC AND ANAEROBIC Blood Culture adequate volume Performed at [place] [address] [place], [place] [address]    Culture      NO GROWTH < 12 HOURS Performed at [place] [address], [place], [place] [address]    Report Status PENDING   Culture, blood (routine x 2)     Status: None (Preliminary result)   Collection Time: 04/12/19  9:16 PM   Specimen: BLOOD  Result Value Ref Range   Specimen Description      BLOOD LEFT ANTECUBITAL Performed at [facility], [place] [address], [place], [place] [address]    Special Requests      BOTTLES DRAWN AEROBIC AND ANAEROBIC Blood Culture adequate volume Performed at [place] [address] [place], [place] [address]    Culture      NO GROWTH < 12 HOURS Performed at [place] [address], [place], [place]  [address]    Report Status PENDING   Lactic acid, plasma     Status: None   Collection Time: 04/12/19 10:20 PM  Result Value Ref Range   Lactic Acid, Venous 0.8 0.5 - 1.9 mmol/L    Comment: Performed at [facility], [place] [address], [place], [place] [address]  Troponin I (High Sensitivity)     Status: None   Collection Time: 04/12/19 10:20 PM  Result Value Ref Range   Troponin I (High Sensitivity) 8 <18 ng/L    Comment: (NOTE) Elevated high sensitivity troponin I (hsTnI) values and significant  changes across serial measurements may suggest ACS but many other  chronic and acute conditions are known to elevate  hsTnI results.  Refer to the "Links" section for chest pain algorithms and additional  guidance. Performed at [facility], [place] [address], [place], [place] [address]   Troponin I (High Sensitivity)     Status: None   Collection Time: 04/13/19  4:54 AM  Result Value Ref Range   Troponin I (High Sensitivity) 8 <18 ng/L    Comment: (NOTE) Elevated high sensitivity troponin I (hsTnI) values and significant  changes across serial measurements may suggest ACS but many other  chronic and acute conditions are known to elevate hsTnI results.  Refer to the "Links" section for chest pain algorithms and additional  guidance. Performed at [facility], [place] [address] [place], [place] [address]     DG Chest 2 View  Result Date: 04/12/2019 CLINICAL DATA:  Chest pain, shortness of breath, dialysis EXAM: CHEST - 2 VIEW COMPARISON:  10/04/2018 FINDINGS: Cardiomegaly. Moderate to large right pleural effusion. Small left pleural effusion. The visualized skeletal structures are unremarkable. IMPRESSION: Cardiomegaly with moderate to large right pleural effusion and small left pleural effusion. Electronically Signed   By: Eddie Candle M.D.   On: 04/12/2019 20:01   CT Chest Wo Contrast  Result Date: 04/12/2019 CLINICAL DATA:  Chest pain and shortness of  breath. EXAM: CT CHEST WITHOUT CONTRAST TECHNIQUE: Multidetector CT imaging of the chest was performed following the standard protocol without IV contrast. COMPARISON:  May 28, 2015 FINDINGS: Cardiovascular: No significant vascular findings. Normal heart size. There is a small pericardial effusion which represents a new finding when compared to the prior study. Mediastinum/Nodes: No enlarged mediastinal or axillary lymph nodes. Lungs/Pleura: A moderate sized area of consolidation is seen within the right lower lobe. A moderate size right pleural effusion is seen. A small left pleural effusion is also noted. There is no evidence of a pneumothorax. Upper Abdomen: A moderate amount of free fluid is seen within the upper abdomen. Musculoskeletal: No chest wall mass or suspicious bone lesions identified. IMPRESSION: 1. Moderate severity right lower lobe consolidation. While this is likely secondary to a combination of atelectasis and/or infiltrate, an underlying neoplastic process cannot be excluded. Follow-up to resolution is recommended. 2. Bilateral pleural effusions, right greater than left. 3. Small pericardial effusion which represents a new finding compared to the prior study dated May 28, 2015. 4. Moderate amount of intra-abdominal free fluid. Electronically Signed   By: Virgina Norfolk M.D.   On: 04/12/2019 22:09   NM Pulmonary Perfusion  Result Date: 04/13/2019 CLINICAL DATA:  39 year old female with chest pain and shortness of breath. EXAM: NUCLEAR MEDICINE PERFUSION LUNG SCAN TECHNIQUE: Perfusion images were obtained in multiple projections after intravenous injection of radiopharmaceutical. Ventilation scans intentionally deferred if perfusion scan and chest x-ray adequate for interpretation during COVID 19 epidemic. RADIOPHARMACEUTICALS:  1.64 mCi Tc-29m MAA IV COMPARISON:  04/12/2019 chest CT and chest radiograph FINDINGS: Very poor perfusion to the RIGHT LOWER lung is noted compatible with  consolidation noted on recent CT. Slightly decreased activity of the remainder of the RIGHT lung is noted, specially on posterior views compatible with pleural effusion as recently identified. No other perfusion abnormalities are noted. IMPRESSION: Indeterminate probability for pulmonary embolus (20-79%), but given recent CT and chest x-ray findings, on the LOWER end of this range. Electronically Signed   By: Margarette Canada M.D.   On: 04/13/2019 09:04    Assessment/Plan **Pleuritic CP secondary to CAP: RLL consolidation on CT chest.  Being treated for CAP.  Doesn't feel SLE  is flaring and with leukocytosis and infiltrate would suspect this is CAP.   **ESRD on PD:  Recent issues with fill/drain pain so taking break from cycler while heals from surgery--> doing manual exchanges at home.  Labs generally ok today, volume OK and makes urine so plan is to hold on PD today and see hows she's doing tomorrow.  She really doesn't want to use the cycler here and we cannot do manual exchanges.  If she's feeling much improved tomorrow she can go home to do PD.  If not, we will have to use cycler.   **HTN: on labetalol 300 BID, procardia 60 BID, lasix 40 daily outpt. Allergic to lisinopril. Add hydralazine 25 TID.    **Anemia: Hb 9.2.  No need for transfusion.  Iron indices being checked. ESA outpt per dialysis protocol.  **BMM: Corr Ca ok.  Check Phos. Not on VDRA or calcimimetic at home.  **Nutrition: alb 3.5, prostat BID.  Will follow, call with questions or concerns.  Justin Mend 04/13/2019, 11:02 AM

## 2019-04-13 NOTE — Progress Notes (Signed)
New Admission Note:  Arrival Method: EMS stretcher Mental Orientation: Alert and oriented x 4 Telemetry: Box 16 NSR Assessment: Completed Skin: Warm and dry IV: NSL Pain: Denies Tubes: PD Cath RLQ Safety Measures: Safety Fall Prevention Plan initiated.  Admission: Completed 5 M  Orientation: Patient has been orientated to the room, unit and the staff. Welcome booklet given.  Family: None Orders have been reviewed and implemented. Will continue to monitor the patient. Call light has been placed within reach and bed alarm has been activated.   Sima Matas BSN, RN  Phone Number: [phone]

## 2019-04-14 DIAGNOSIS — I10 Essential (primary) hypertension: Secondary | ICD-10-CM | POA: Diagnosis not present

## 2019-04-14 DIAGNOSIS — R071 Chest pain on breathing: Secondary | ICD-10-CM

## 2019-04-14 DIAGNOSIS — I313 Pericardial effusion (noninflammatory): Secondary | ICD-10-CM

## 2019-04-14 DIAGNOSIS — N186 End stage renal disease: Secondary | ICD-10-CM | POA: Diagnosis not present

## 2019-04-14 LAB — RENAL FUNCTION PANEL
Albumin: 2.7 g/dL — ABNORMAL LOW (ref 3.5–5.0)
Anion gap: 15 (ref 5–15)
BUN: 89 mg/dL — ABNORMAL HIGH (ref 6–20)
CO2: 19 mmol/L — ABNORMAL LOW (ref 22–32)
Calcium: 8.2 mg/dL — ABNORMAL LOW (ref 8.9–10.3)
Chloride: 103 mmol/L (ref 98–111)
Creatinine, Ser: 8.3 mg/dL — ABNORMAL HIGH (ref 0.44–1.00)
GFR calc Af Amer: 6 mL/min — ABNORMAL LOW (ref >60)
GFR calc non Af Amer: 5 mL/min — ABNORMAL LOW (ref >60)
Glucose, Bld: 94 mg/dL (ref 70–99)
Phosphorus: 6.8 mg/dL — ABNORMAL HIGH (ref 2.5–4.6)
Potassium: 4 mmol/L (ref 3.5–5.1)
Sodium: 137 mmol/L (ref 135–145)

## 2019-04-14 MED ORDER — AMOXICILLIN-POT CLAVULANATE 875-125 MG PO TABS
1.0000 | ORAL_TABLET | Freq: Two times a day (BID) | ORAL | Status: DC
  Administered 2019-04-14: 1 via ORAL
  Filled 2019-04-14: qty 1

## 2019-04-14 MED ORDER — GENTAMICIN SULFATE 0.1 % EX CREA
1.0000 "application " | TOPICAL_CREAM | Freq: Every day | CUTANEOUS | Status: DC
  Filled 2019-04-14: qty 15

## 2019-04-14 MED ORDER — PRO-STAT SUGAR FREE PO LIQD: 30.0000 mL | Freq: Two times a day (BID) | ORAL | 0 refills | Status: DC

## 2019-04-14 MED ORDER — GENTAMICIN SULFATE 0.1 % EX CREA: 1.0000 "application " | TOPICAL_CREAM | Freq: Every day | CUTANEOUS | 0 refills | Status: DC

## 2019-04-14 MED ORDER — AZITHROMYCIN 250 MG PO TABS: 250.0000 mg | ORAL_TABLET | Freq: Every day | ORAL | 0 refills | Status: DC

## 2019-04-14 MED ORDER — IPRATROPIUM-ALBUTEROL 0.5-2.5 (3) MG/3ML IN SOLN
3.0000 mL | RESPIRATORY_TRACT | Status: DC | PRN
  Administered 2019-04-14: 3 mL via RESPIRATORY_TRACT
  Filled 2019-04-14: qty 3

## 2019-04-14 MED ORDER — DELFLEX-LC/2.5% DEXTROSE 394 MOSM/L IP SOLN: INTRAPERITONEAL | Status: DC

## 2019-04-14 MED ORDER — AMOXICILLIN-POT CLAVULANATE 875-125 MG PO TABS: 1.0000 | ORAL_TABLET | Freq: Two times a day (BID) | ORAL | 0 refills | Status: DC

## 2019-04-14 MED ORDER — AZITHROMYCIN 500 MG PO TABS
250.0000 mg | ORAL_TABLET | Freq: Every day | ORAL | Status: DC
  Administered 2019-04-14: 250 mg via ORAL
  Filled 2019-04-14: qty 1

## 2019-04-14 NOTE — Plan of Care (Signed)
  Problem: Clinical Measurements: Goal: Ability to maintain clinical measurements within normal limits will improve Outcome: Progressing   

## 2019-04-14 NOTE — Progress Notes (Signed)
Latasha Drake to be discharged home per MD order. Discussed prescriptions and follow up appointments with the patient. Prescriptions given to patient; medication list explained in detail. Patient verbalized understanding.  Skin clean, dry and intact without evidence of skin break down, no evidence of skin tears noted. IV catheter discontinued intact. Site without signs and symptoms of complications. Dressing and pressure applied. Pt denies pain at the site currently. No complaints noted.  Patient free of lines, drains, and wounds.   An After Visit Summary (AVS) was printed and given to the patient. Patient escorted via wheelchair, and discharged home via private auto.  Baldo Ash, RN

## 2019-04-14 NOTE — Plan of Care (Signed)
  Problem: Education: Goal: Knowledge of General Education information will improve Description: Including pain rating scale, medication(s)/side effects and non-pharmacologic comfort measures Outcome: Adequate for Discharge   Problem: Health Behavior/Discharge Planning: Goal: Ability to manage health-related needs will improve Outcome: Adequate for Discharge   Problem: Clinical Measurements: Goal: Ability to maintain clinical measurements within normal limits will improve 04/14/2019 1425 by Baldo Ash, RN Outcome: Adequate for Discharge 04/14/2019 0859 by Baldo Ash, RN Outcome: Progressing Goal: Will remain free from infection Outcome: Adequate for Discharge Goal: Diagnostic test results will improve Outcome: Adequate for Discharge Goal: Respiratory complications will improve Outcome: Adequate for Discharge Goal: Cardiovascular complication will be avoided Outcome: Adequate for Discharge   Problem: Activity: Goal: Risk for activity intolerance will decrease Outcome: Adequate for Discharge   Problem: Nutrition: Goal: Adequate nutrition will be maintained Outcome: Adequate for Discharge   Problem: Coping: Goal: Level of anxiety will decrease Outcome: Adequate for Discharge   Problem: Elimination: Goal: Will not experience complications related to bowel motility Outcome: Adequate for Discharge Goal: Will not experience complications related to urinary retention Outcome: Adequate for Discharge   Problem: Pain Managment: Goal: General experience of comfort will improve Outcome: Adequate for Discharge   Problem: Safety: Goal: Ability to remain free from injury will improve Outcome: Adequate for Discharge   Problem: Skin Integrity: Goal: Risk for impaired skin integrity will decrease Outcome: Adequate for Discharge   Problem: Activity: Goal: Ability to tolerate increased activity will improve Outcome: Adequate for Discharge   Problem: Clinical  Measurements: Goal: Ability to maintain a body temperature in the normal range will improve Outcome: Adequate for Discharge   Problem: Respiratory: Goal: Ability to maintain adequate ventilation will improve Outcome: Adequate for Discharge Goal: Ability to maintain a clear airway will improve Outcome: Adequate for Discharge   Problem: Education: Goal: Knowledge of disease and its progression will improve Outcome: Adequate for Discharge Goal: Individualized Educational Video(s) Outcome: Adequate for Discharge   Problem: Fluid Volume: Goal: Compliance with measures to maintain balanced fluid volume will improve Outcome: Adequate for Discharge   Problem: Health Behavior/Discharge Planning: Goal: Ability to manage health-related needs will improve Outcome: Adequate for Discharge   Problem: Nutritional: Goal: Ability to make healthy dietary choices will improve Outcome: Adequate for Discharge   Problem: Clinical Measurements: Goal: Complications related to the disease process, condition or treatment will be avoided or minimized Outcome: Adequate for Discharge

## 2019-04-14 NOTE — Discharge Summary (Signed)
Patient ID: Latasha Drake MRN: [id number] DOB/AGE: 1981-02-05 39 y.o.  Admit date: 04/12/2019 Discharge date: 04/14/2019  Primary Care Physician:  [facility] (Inactive)  Discharge Diagnoses:   Present on Admission: . Pneumonia . Chest pain . ESRD (end stage renal disease) ([place]) . Hypertension . Pericardial effusion   Consults nephrology  Recommendations for Outpatient Follow-up:  1. Repeat Chest CT to f/u infiltrate, and to r/o neoplastic disease 2. Please repeat CBC/BMET at next visit 3. Please follow blood/urine cultures till final   DIET: 2 g sodium diet    Allergies:   Allergies  Allergen Reactions  . Lisinopril Swelling     DISCHARGE MEDICATIONS: Allergies as of 04/14/2019      Reactions   Lisinopril Swelling      Medication List    TAKE these medications   amoxicillin-clavulanate 875-125 MG tablet Commonly known as: AUGMENTIN Take 1 tablet by mouth every 12 (twelve) hours.   azithromycin 250 MG tablet Commonly known as: ZITHROMAX Take 1 tablet (250 mg total) by mouth daily.   citalopram 40 MG tablet Commonly known as: CELEXA Take 40 mg by mouth daily. What changed: Another medication with the same name was removed. Continue taking this medication, and follow the directions you see here.   cyclobenzaprine 10 MG tablet Commonly known as: FLEXERIL Take 10 mg by mouth 3 (three) times daily as needed for muscle spasms.   feeding supplement (PRO-STAT SUGAR FREE 64) Liqd Take 30 mLs by mouth 2 (two) times daily.   furosemide 40 MG tablet Commonly known as: LASIX Take 40 mg by mouth daily.   gentamicin cream 0.1 % Commonly known as: GARAMYCIN Apply 1 application topically daily.   labetalol 300 MG tablet Commonly known as: NORMODYNE Take 300 mg by mouth 2 (two) times daily.   NIFEdipine 60 MG 24 hr tablet Commonly known as: PROCARDIA XL/NIFEDICAL XL Take 60 mg by mouth 2 (two) times daily.   ondansetron 4 MG  tablet Commonly known as: Zofran Take 1 tablet (4 mg total) by mouth every 8 (eight) hours as needed for nausea or vomiting.   pantoprazole 40 MG tablet Commonly known as: PROTONIX Take 40 mg by mouth daily as needed (indigestion).   paragard intrauterine copper Iud IUD 1 Intra Uterine Device by Intrauterine route once.   predniSONE 20 MG tablet Commonly known as: DELTASONE Take 20 mg by mouth daily.   traMADol 50 MG tablet Commonly known as: ULTRAM Take 50 mg by mouth every 6 (six) hours as needed for moderate pain.   Vitamin D (Ergocalciferol) 1.25 MG (50000 UNIT) Caps capsule Commonly known as: DRISDOL Take 50,000 Units by mouth every Sunday. Take on Sun.        Brief H and P: For complete details please refer to admission H and P, but in brief,  Latasha Drake  is a 39 y.o. female, w hypertension, lupus, anemia, ESRD on peritoneal dialysis, recent PD catheter adjustment about 1 week ago,  presents with chest pain and dyspnea x 2 days.  Slight dry cough.  Chest pain is with deep inspiration. Pt denies fever, chills, covid exposure, palp, n/v, abd pain, diarrhea, brbpr, dysuria, hematuria.    In ED,  T 98.3, P 98 R 30, Bp 168/111 pox 97% on RA Wt 72.6kg  CT chest  IMPRESSION: 1. Moderate severity right lower lobe consolidation. While this is likely secondary to a combination of atelectasis and/or infiltrate, an underlying neoplastic process cannot be excluded. Follow-up to resolution is  recommended. 2. Bilateral pleural effusions, right greater than left. 3. Small pericardial effusion which represents a new finding compared to the prior study dated May 28, 2015. 4. Moderate amount of intra-abdominal free fluid.  Na 141, K 3.9, Bun 83, Creatinine 8.08 Ast 14, Alt 9 Lipase 59 Wbc 14.7, Hgb 9.2, Plt 523 mcv 69.6, Rdw 17.2 Trop 8 BNP 262.8 D dimer 16.19 Blood culture x2 pending  covid-19 pending  Rocephin 1gm iv x1, zithromax 500mg  iv x1  Pt will be  admitted for pneumonia.   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 Patient admitted to hospitalist service for pneumonia.  Community acquired pneumonia Leukocytosis noted -cannot rule out infectious etiology, patient on chronic steroid therapy with prednisone Infiltrate on Chest CT on admission Antibiotics initiated on admission with Zithromax and Rocephin Blood culture x2 Urine Legionella antigen and strep antigen follow-up Overnight patient is overall feeling well without any need for supplemental oxygen and no fever or chills with improvement in his pleuritic symptoms.  Right more than left pleural effusion History of chest pain on presentation-troponin negative for acute coronary syndrome echocardiogram 04/13/2019-EF 60-65%. Left ventricular septal wall  thickness was moderately increased. Moderately increased left ventricular  posterior wall thickness.  Grade 1 diastolic dysfunction.  Mild to moderate pericardial effusion.  Echocardiogram was reviewed by me and cardiologist Dr. Einar Gip, no evidence of clinically patient comfortable with stable hemodynamics without any neck vein engorgement  Hypertension Continue home calcium blocker with as needed hydralazine IV  Adjust regimen as needed for better blood pressure control  End-stage renal disease on peritoneal hemodialysis Monitor renal function with electrolytes Nephrology consulted  Anemia Likely due to chronic disease Follow-up anemia studies  Lupus On chronic steroid Continue prednisone 20 mg daily    Day of Discharge BP (!) 147/100 (BP Location: Left  Arm)   Pulse 97   Temp 98.8 F (37.1 C) (Oral)   Resp 20   Ht 5\' 4"  (1.626 m)   Wt 76.1 kg   LMP 02/24/2019   SpO2 98%   BMI 28.79 kg/m   Physical Exam: General: NAD HEENT: ncat, perrl, mmm, (-) JVD CVS: rrr, (-) gallop, (-) murmur Chest: lung CTA Abdomen: snt, (+)BS Extremities: no cyanosis, no edema Neuro: Alert, Ox3  The results of significant diagnostics from this hospitalization (including imaging, microbiology, ancillary and laboratory) are listed below for reference.    LAB RESULTS: Basic Metabolic Panel: Recent Labs  Lab 04/12/19 1953 04/13/19 1143 04/14/19 0552  NA 141  --  137  K 3.9  --  4.0  CL 104  --  103  CO2 22  --  19*  GLUCOSE 98  --  94  BUN 83*  --  89*  CREATININE 8.08*  --  8.30*  CALCIUM 8.6*  --  8.2*  PHOS  --    < > 6.8*   < > = values in this interval not displayed.   Liver Function Tests: Recent Labs  Lab 04/12/19 1953 04/14/19 0552  AST 14*  --   ALT 9  --   ALKPHOS 82  --   BILITOT 0.6  --   PROT 7.1  --  ALBUMIN 3.5 2.7*   Recent Labs  Lab 04/12/19 1953  LIPASE 59*   No results for input(s): AMMONIA in the last 168 hours. CBC: Recent Labs  Lab 04/12/19 1953 04/12/19 1953 04/13/19 1143  WBC 14.7*  --  12.6*  HGB 9.2*  --  8.6*  HCT 27.5*  --  26.9*  MCV 69.6*   < > 70.8*  PLT 523*  --  479*   < > = values in this interval not displayed.   Cardiac Enzymes: No results for input(s): CKTOTAL, CKMB, CKMBINDEX, TROPONINI in the last 168 hours. BNP: Invalid input(s): POCBNP CBG: No results for input(s): GLUCAP in the last 168 hours.  Significant Diagnostic Studies:  DG Chest 2 View  Result Date: 04/12/2019 CLINICAL DATA:  Chest pain, shortness of breath, dialysis EXAM: CHEST - 2 VIEW COMPARISON:  10/04/2018 FINDINGS: Cardiomegaly. Moderate to large right pleural effusion. Small left pleural effusion. The visualized skeletal structures are unremarkable. IMPRESSION: Cardiomegaly with moderate to large right pleural  effusion and small left pleural effusion. Electronically Signed   By: Eddie Candle M.D.   On: 04/12/2019 20:01   CT Chest Wo Contrast  Result Date: 04/12/2019 CLINICAL DATA:  Chest pain and shortness of breath. EXAM: CT CHEST WITHOUT CONTRAST TECHNIQUE: Multidetector CT imaging of the chest was performed following the standard protocol without IV contrast. COMPARISON:  May 28, 2015 FINDINGS: Cardiovascular: No significant vascular findings. Normal heart size. There is a small pericardial effusion which represents a new finding when compared to the prior study. Mediastinum/Nodes: No enlarged mediastinal or axillary lymph nodes. Lungs/Pleura: A moderate sized area of consolidation is seen within the right lower lobe. A moderate size right pleural effusion is seen. A small left pleural effusion is also noted. There is no evidence of a pneumothorax. Upper Abdomen: A moderate amount of free fluid is seen within the upper abdomen. Musculoskeletal: No chest wall mass or suspicious bone lesions identified. IMPRESSION: 1. Moderate severity right lower lobe consolidation. While this is likely secondary to a combination of atelectasis and/or infiltrate, an underlying neoplastic process cannot be excluded. Follow-up to resolution is recommended. 2. Bilateral pleural effusions, right greater than left. 3. Small pericardial effusion which represents a new finding compared to the prior study dated May 28, 2015. 4. Moderate amount of intra-abdominal free fluid. Electronically Signed   By: Virgina Norfolk M.D.   On: 04/12/2019 22:09   NM Pulmonary Perfusion  Result Date: 04/13/2019 CLINICAL DATA:  39 year old female with chest pain and shortness of breath. EXAM: NUCLEAR MEDICINE PERFUSION LUNG SCAN TECHNIQUE: Perfusion images were obtained in multiple projections after intravenous injection of radiopharmaceutical. Ventilation scans intentionally deferred if perfusion scan and chest x-ray adequate for interpretation  during COVID 19 epidemic. RADIOPHARMACEUTICALS:  1.64 mCi Tc-46m MAA IV COMPARISON:  04/12/2019 chest CT and chest radiograph FINDINGS: Very poor perfusion to the RIGHT LOWER lung is noted compatible with consolidation noted on recent CT. Slightly decreased activity of the remainder of the RIGHT lung is noted, specially on posterior views compatible with pleural effusion as recently identified. No other perfusion abnormalities are noted. IMPRESSION: Indeterminate probability for pulmonary embolus (20-79%), but given recent CT and chest x-ray findings, on the LOWER end of this range. Electronically Signed   By: Margarette Canada M.D.   On: 04/13/2019 09:04   ECHOCARDIOGRAM COMPLETE  Result Date: 04/13/2019   ECHOCARDIOGRAM REPORT   Patient Name:   AVO VOCI Date of Exam: 04/13/2019 Medical Rec #:  [id number]   Height:  64.0 in Accession #:    [id number]  Weight:       160.0 lb Date of Birth:  Jul 16, 1980  BSA:          1.78 m Patient Age:    38 years    BP:           165/95 mmHg Patient Gender: F           HR:           102 bpm. Exam Location:  Inpatient Procedure: 2D Echo Indications:    Pericardial effusion 423.9 / I31.3  History:        Patient has prior history of Echocardiogram examinations, most                 recent 10/04/2018. Pericardial Disease, Signs/Symptoms:Chest                 Pain; Risk Factors:Hypertension and Current Smoker. ESRD, Lupus,                 Acute renal failure superimposed on chronic kidney disease ,                 Pneumonia.  Sonographer:    Leavy Cella Referring Phys: [place]  1. Left ventricular ejection fraction, by visual estimation, is 60 to 65%. The left ventricle has normal function. Left ventricular septal wall thickness was moderately increased. Moderately increased left ventricular posterior wall thickness. There is no left ventricular hypertrophy.  2. The left ventricle has no regional wall motion abnormalities.  3. Left ventricular diastolic  parameters are consistent with Grade I diastolic dysfunction (impaired relaxation).  4. Global right ventricle has normal systolic function.The right ventricular size is normal. No increase in right ventricular wall thickness.  5. Left atrial size was mildly dilated.  6. Right atrial size was normal.  7. The mitral valve is normal in structure. No evidence of mitral valve regurgitation. No evidence of mitral stenosis.  8. The tricuspid valve is normal in structure. Tricuspid valve regurgitation is mild.  9. The aortic valve is normal in structure. Aortic valve regurgitation is not visualized. No evidence of aortic valve sclerosis or stenosis. 10. The pulmonic valve was normal in structure. Pulmonic valve regurgitation is not visualized. 11. Normal pulmonary artery systolic pressure. 12. The inferior vena cava is normal in size with greater than 50% respiratory variability, suggesting right atrial pressure of 3 mmHg. 13. Mild to moderate pericardial effusion. 14. The pericardial effusion is circumferential. 15. There is intermittent RV collapse and RA inversion. There is more than 34mmHg change in MV inflow velocities. Although these are findings seen with tamponade, the IVC is normal in diameter and fully collapses with respirations. Clinical correlation recommended. 16. Cannot access prior echo images to compare. FINDINGS  Left Ventricle: Left ventricular ejection fraction, by visual estimation, is 60 to 65%. The left ventricle has normal function. The left ventricle has no regional wall motion abnormalities. Moderately increased left ventricular posterior wall thickness.  There is no left ventricular hypertrophy. Left ventricular diastolic parameters are consistent with Grade I diastolic dysfunction (impaired relaxation). Normal left atrial pressure. Right Ventricle: The right ventricular size is normal. No increase in right ventricular wall thickness. Global RV systolic function is has normal systolic function.  The tricuspid regurgitant velocity is 2.31 m/s, and with an assumed right atrial pressure  of 3 mmHg, the estimated right ventricular systolic pressure is normal at 24.3 mmHg. Left Atrium: Left atrial size  was mildly dilated. Right Atrium: Right atrial size was normal in size Pericardium: A moderately sized pericardial effusion is present. The pericardial effusion is circumferential. There is inversion of the right atrial wall. There is no evidence of cardiac tamponade. There is intermittent RV collapse and RA inversion. There is more than 58mmHg change in MV inflow velocities. Although these are findings seen with tamponade, the IVC is normal in diameter and fully collapses with respirations. Clinical correlation recommended. Mitral Valve: The mitral valve is normal in structure. No evidence of mitral valve regurgitation. No evidence of mitral valve stenosis by observation. Tricuspid Valve: The tricuspid valve is normal in structure. Tricuspid valve regurgitation is mild. Aortic Valve: The aortic valve is normal in structure. Aortic valve regurgitation is not visualized. The aortic valve is structurally normal, with no evidence of sclerosis or stenosis. Pulmonic Valve: The pulmonic valve was normal in structure. Pulmonic valve regurgitation is not visualized. Pulmonic regurgitation is not visualized. Aorta: The aortic root, ascending aorta and aortic arch are all structurally normal, with no evidence of dilitation or obstruction. Venous: The inferior vena cava is normal in size with greater than 50% respiratory variability, suggesting right atrial pressure of 3 mmHg. IAS/Shunts: No atrial level shunt detected by color flow Doppler. There is no evidence of a patent foramen ovale. No ventricular septal defect is seen or detected. There is no evidence of an atrial septal defect.  LEFT VENTRICLE PLAX 2D LVIDd:         4.70 cm  Diastology LVIDs:         2.90 cm  LV e' lateral:   11.20 cm/s LV PW:         1.40 cm  LV  E/e' lateral: 9.4 LV IVS:        1.40 cm  LV e' medial:    10.00 cm/s LVOT diam:     1.90 cm  LV E/e' medial:  10.5 LV SV:         70 ml LV SV Index:   38.35 LVOT Area:     2.84 cm  RIGHT VENTRICLE RV S prime:     27.10 cm/s TAPSE (M-mode): 1.7 cm LEFT ATRIUM             Index       RIGHT ATRIUM          Index LA diam:        4.50 cm 2.53 cm/m  RA Area:     8.30 cm LA Vol (A2C):   78.5 ml 44.12 ml/m RA Volume:   17.20 ml 9.67 ml/m LA Vol (A4C):   59.6 ml 33.50 ml/m LA Biplane Vol: 72.4 ml 40.69 ml/m   AORTA Ao Root diam: 3.00 cm MITRAL VALVE                         TRICUSPID VALVE MV Area (PHT): 3.83 cm              TR Peak grad:   21.3 mmHg MV PHT:        57.42 msec            TR Vmax:        231.00 cm/s MV Decel Time: 198 msec MV E velocity: 105.00 cm/s 103 cm/s  SHUNTS MV A velocity: 142.00 cm/s 70.3 cm/s Systemic Diam: 1.90 cm MV E/A ratio:  0.74        1.5  Fransico Him MD Electronically signed by  Fransico Him MD Signature Date/Time: 04/13/2019/1:48:26 PM    Final     2D ECHO:   Disposition and Follow-up: Discharge Instructions    Call MD for:   Complete by: As directed    Call MD for:   Complete by: As directed    Call MD for:  difficulty breathing, headache or visual disturbances   Complete by: As directed    Call MD for:  difficulty breathing, headache or visual disturbances   Complete by: As directed    Call MD for:  extreme fatigue   Complete by: As directed    Call MD for:  extreme fatigue   Complete by: As directed    Call MD for:  hives   Complete by: As directed    Call MD for:  hives   Complete by: As directed    Call MD for:  persistant dizziness or light-headedness   Complete by: As directed    Call MD for:  persistant dizziness or light-headedness   Complete by: As directed    Call MD for:  persistant nausea and vomiting   Complete by: As directed    Call MD for:  persistant nausea and vomiting   Complete by: As directed    Call MD for:  redness, tenderness, or  signs of infection (pain, swelling, redness, odor or green/yellow discharge around incision site)   Complete by: As directed    Call MD for:  redness, tenderness, or signs of infection (pain, swelling, redness, odor or green/yellow discharge around incision site)   Complete by: As directed    Call MD for:  severe uncontrolled pain   Complete by: As directed    Call MD for:  severe uncontrolled pain   Complete by: As directed    Call MD for:  temperature >100.4   Complete by: As directed    Call MD for:  temperature >100.4   Complete by: As directed    Diet - low sodium heart healthy   Complete by: As directed    Diet - low sodium heart healthy   Complete by: As directed    Increase activity slowly   Complete by: As directed    Increase activity slowly   Complete by: As directed        DISPOSITION: Home   DISCHARGE FOLLOW-UP    Time spent on Discharge: 35 minutes  Signed:   Benito Mccreedy M.D. [facility] 04/14/2019, 11:23 AM [phone]

## 2019-04-14 NOTE — Progress Notes (Signed)
[facility] Progress Note   Subjective:   Feels ok, has ambulated in room. PO intake ok.  No constipation, fevers, chills.  Still feeling SOB but not worse.  Afebrile  Objective Vitals:   04/13/19 1752 04/13/19 2101 04/14/19 0510 04/14/19 0533  BP: (!) 150/104 (!) 145/95 (!) 147/100   Pulse: (!) 116 (!) 108 100 97  Resp: 18 18 20    Temp:  98.3 F (36.8 C) 98.8 F (37.1 C)   TempSrc:  Oral Oral   SpO2: 96% 93% 92% 98%  Weight:  76.1 kg    Height:       Physical Exam General: nontoxic lying in bed, fatigued Heart: RRR Lungs:normal WOB, Dec BS bases, a few rhonchi R > L Abdomen: RLQ exit site c/d/i, no dressing in place; 4 incision from laproscopic surgery with steristrips c/d/i  Extremities: no edema  Additional Objective Labs: Basic Metabolic Panel: Recent Labs  Lab 04/12/19 1953 04/13/19 1143 04/14/19 0552  NA 141  --  137  K 3.9  --  4.0  CL 104  --  103  CO2 22  --  19*  GLUCOSE 98  --  94  BUN 83*  --  89*  CREATININE 8.08*  --  8.30*  CALCIUM 8.6*  --  8.2*  PHOS  --  5.4* 6.8*   Liver Function Tests: Recent Labs  Lab 04/12/19 1953 04/14/19 0552  AST 14*  --   ALT 9  --   ALKPHOS 82  --   BILITOT 0.6  --   PROT 7.1  --   ALBUMIN 3.5 2.7*   Recent Labs  Lab 04/12/19 1953  LIPASE 59*   CBC: Recent Labs  Lab 04/12/19 1953 04/13/19 1143  WBC 14.7* 12.6*  HGB 9.2* 8.6*  HCT 27.5* 26.9*  MCV 69.6* 70.8*  PLT 523* 479*   Blood Culture    Component Value Date/Time   SDES  04/12/2019 2116    BLOOD RIGHT FOREARM Performed at [facility], [place] [address] [place], [place] [address]    SDES  04/12/2019 2116    BLOOD LEFT ANTECUBITAL Performed at [facility] [address] [place], [place] [address]    SPECREQUEST  04/12/2019 2116    BOTTLES DRAWN AEROBIC AND ANAEROBIC Blood Culture adequate volume Performed at [facility] [address] [place], [place]  [address]    SPECREQUEST  04/12/2019 2116    BOTTLES DRAWN AEROBIC AND ANAEROBIC Blood Culture adequate volume Performed at [facility] [address] [place], [place] [address]    CULT  04/12/2019 2116    NO GROWTH 2 DAYS Performed at [facility] [address], [place], [place] [address]    CULT  04/12/2019 2116    NO GROWTH 2 DAYS Performed at [facility] [address], [place], [place] [address]    REPTSTATUS PENDING 04/12/2019 2116   REPTSTATUS PENDING 04/12/2019 2116    Cardiac Enzymes: No results for input(s): CKTOTAL, CKMB, CKMBINDEX, TROPONINI in the last 168 hours. CBG: No results for input(s): GLUCAP in the last 168 hours. Iron Studies: No results for input(s): IRON, TIBC, TRANSFERRIN, FERRITIN in the last 72 hours. @lablastinr3 @ Studies/Results: DG Chest 2 View  Result Date: 04/12/2019 CLINICAL DATA:  Chest pain, shortness of breath, dialysis EXAM: CHEST - 2 VIEW COMPARISON:  10/04/2018 FINDINGS: Cardiomegaly. Moderate to large right pleural effusion. Small left pleural effusion. The visualized skeletal structures are  unremarkable. IMPRESSION: Cardiomegaly with moderate to large right pleural effusion and small left pleural effusion. Electronically Signed   By: Eddie Candle M.D.   On: 04/12/2019 20:01   CT Chest Wo Contrast  Result Date: 04/12/2019 CLINICAL DATA:  Chest pain and shortness of breath. EXAM: CT CHEST WITHOUT CONTRAST TECHNIQUE: Multidetector CT imaging of the chest was performed following the standard protocol without IV contrast. COMPARISON:  May 28, 2015 FINDINGS: Cardiovascular: No significant vascular findings. Normal heart size. There is a small pericardial effusion which represents a new finding when compared to the prior study. Mediastinum/Nodes: No enlarged mediastinal or axillary lymph nodes. Lungs/Pleura: A moderate sized area of consolidation is seen within the right lower lobe. A moderate size right pleural  effusion is seen. A small left pleural effusion is also noted. There is no evidence of a pneumothorax. Upper Abdomen: A moderate amount of free fluid is seen within the upper abdomen. Musculoskeletal: No chest wall mass or suspicious bone lesions identified. IMPRESSION: 1. Moderate severity right lower lobe consolidation. While this is likely secondary to a combination of atelectasis and/or infiltrate, an underlying neoplastic process cannot be excluded. Follow-up to resolution is recommended. 2. Bilateral pleural effusions, right greater than left. 3. Small pericardial effusion which represents a new finding compared to the prior study dated May 28, 2015. 4. Moderate amount of intra-abdominal free fluid. Electronically Signed   By: Virgina Norfolk M.D.   On: 04/12/2019 22:09   NM Pulmonary Perfusion  Result Date: 04/13/2019 CLINICAL DATA:  39 year old female with chest pain and shortness of breath. EXAM: NUCLEAR MEDICINE PERFUSION LUNG SCAN TECHNIQUE: Perfusion images were obtained in multiple projections after intravenous injection of radiopharmaceutical. Ventilation scans intentionally deferred if perfusion scan and chest x-ray adequate for interpretation during COVID 19 epidemic. RADIOPHARMACEUTICALS:  1.64 mCi Tc-63m MAA IV COMPARISON:  04/12/2019 chest CT and chest radiograph FINDINGS: Very poor perfusion to the RIGHT LOWER lung is noted compatible with consolidation noted on recent CT. Slightly decreased activity of the remainder of the RIGHT lung is noted, specially on posterior views compatible with pleural effusion as recently identified. No other perfusion abnormalities are noted. IMPRESSION: Indeterminate probability for pulmonary embolus (20-79%), but given recent CT and chest x-ray findings, on the LOWER end of this range. Electronically Signed   By: Margarette Canada M.D.   On: 04/13/2019 09:04   ECHOCARDIOGRAM COMPLETE  Result Date: 04/13/2019   ECHOCARDIOGRAM REPORT   Patient Name:   Latasha Drake  Date of Exam: 04/13/2019 Medical Rec #:  [id number]   Height:       64.0 in Accession #:    [id number]  Weight:       160.0 lb Date of Birth:  Feb 26, 1981  BSA:          1.78 m Patient Age:    38 years    BP:           165/95 mmHg Patient Gender: F           HR:           102 bpm. Exam Location:  Inpatient Procedure: 2D Echo Indications:    Pericardial effusion 423.9 / I31.3  History:        Patient has prior history of Echocardiogram examinations, most                 recent 10/04/2018. Pericardial Disease, Signs/Symptoms:Chest                 Pain; Risk  Factors:Hypertension and Current Smoker. ESRD, Lupus,                 Acute renal failure superimposed on chronic kidney disease ,                 Pneumonia.  Sonographer:    Leavy Cella Referring Phys: Carlton  1. Left ventricular ejection fraction, by visual estimation, is 60 to 65%. The left ventricle has normal function. Left ventricular septal wall thickness was moderately increased. Moderately increased left ventricular posterior wall thickness. There is no left ventricular hypertrophy.  2. The left ventricle has no regional wall motion abnormalities.  3. Left ventricular diastolic parameters are consistent with Grade I diastolic dysfunction (impaired relaxation).  4. Global right ventricle has normal systolic function.The right ventricular size is normal. No increase in right ventricular wall thickness.  5. Left atrial size was mildly dilated.  6. Right atrial size was normal.  7. The mitral valve is normal in structure. No evidence of mitral valve regurgitation. No evidence of mitral stenosis.  8. The tricuspid valve is normal in structure. Tricuspid valve regurgitation is mild.  9. The aortic valve is normal in structure. Aortic valve regurgitation is not visualized. No evidence of aortic valve sclerosis or stenosis. 10. The pulmonic valve was normal in structure. Pulmonic valve regurgitation is not visualized. 11. Normal pulmonary  artery systolic pressure. 12. The inferior vena cava is normal in size with greater than 50% respiratory variability, suggesting right atrial pressure of 3 mmHg. 13. Mild to moderate pericardial effusion. 14. The pericardial effusion is circumferential. 15. There is intermittent RV collapse and RA inversion. There is more than 50mmHg change in MV inflow velocities. Although these are findings seen with tamponade, the IVC is normal in diameter and fully collapses with respirations. Clinical correlation recommended. 16. Cannot access prior echo images to compare. FINDINGS  Left Ventricle: Left ventricular ejection fraction, by visual estimation, is 60 to 65%. The left ventricle has normal function. The left ventricle has no regional wall motion abnormalities. Moderately increased left ventricular posterior wall thickness.  There is no left ventricular hypertrophy. Left ventricular diastolic parameters are consistent with Grade I diastolic dysfunction (impaired relaxation). Normal left atrial pressure. Right Ventricle: The right ventricular size is normal. No increase in right ventricular wall thickness. Global RV systolic function is has normal systolic function. The tricuspid regurgitant velocity is 2.31 m/s, and with an assumed right atrial pressure  of 3 mmHg, the estimated right ventricular systolic pressure is normal at 24.3 mmHg. Left Atrium: Left atrial size was mildly dilated. Right Atrium: Right atrial size was normal in size Pericardium: A moderately sized pericardial effusion is present. The pericardial effusion is circumferential. There is inversion of the right atrial wall. There is no evidence of cardiac tamponade. There is intermittent RV collapse and RA inversion. There is more than 37mmHg change in MV inflow velocities. Although these are findings seen with tamponade, the IVC is normal in diameter and fully collapses with respirations. Clinical correlation recommended. Mitral Valve: The mitral valve  is normal in structure. No evidence of mitral valve regurgitation. No evidence of mitral valve stenosis by observation. Tricuspid Valve: The tricuspid valve is normal in structure. Tricuspid valve regurgitation is mild. Aortic Valve: The aortic valve is normal in structure. Aortic valve regurgitation is not visualized. The aortic valve is structurally normal, with no evidence of sclerosis or stenosis. Pulmonic Valve: The pulmonic valve was normal in structure. Pulmonic valve regurgitation is  not visualized. Pulmonic regurgitation is not visualized. Aorta: The aortic root, ascending aorta and aortic arch are all structurally normal, with no evidence of dilitation or obstruction. Venous: The inferior vena cava is normal in size with greater than 50% respiratory variability, suggesting right atrial pressure of 3 mmHg. IAS/Shunts: No atrial level shunt detected by color flow Doppler. There is no evidence of a patent foramen ovale. No ventricular septal defect is seen or detected. There is no evidence of an atrial septal defect.  LEFT VENTRICLE PLAX 2D LVIDd:         4.70 cm  Diastology LVIDs:         2.90 cm  LV e' lateral:   11.20 cm/s LV PW:         1.40 cm  LV E/e' lateral: 9.4 LV IVS:        1.40 cm  LV e' medial:    10.00 cm/s LVOT diam:     1.90 cm  LV E/e' medial:  10.5 LV SV:         70 ml LV SV Index:   38.35 LVOT Area:     2.84 cm  RIGHT VENTRICLE RV S prime:     27.10 cm/s TAPSE (M-mode): 1.7 cm LEFT ATRIUM             Index       RIGHT ATRIUM          Index LA diam:        4.50 cm 2.53 cm/m  RA Area:     8.30 cm LA Vol (A2C):   78.5 ml 44.12 ml/m RA Volume:   17.20 ml 9.67 ml/m LA Vol (A4C):   59.6 ml 33.50 ml/m LA Biplane Vol: 72.4 ml 40.69 ml/m   AORTA Ao Root diam: 3.00 cm MITRAL VALVE                         TRICUSPID VALVE MV Area (PHT): 3.83 cm              TR Peak grad:   21.3 mmHg MV PHT:        57.42 msec            TR Vmax:        231.00 cm/s MV Decel Time: 198 msec MV E velocity: 105.00  cm/s 103 cm/s  SHUNTS MV A velocity: 142.00 cm/s 70.3 cm/s Systemic Diam: 1.90 cm MV E/A ratio:  0.74        1.5  Fransico Him MD Electronically signed by Fransico Him MD Signature Date/Time: 04/13/2019/1:48:26 PM    Final    Medications: . azithromycin 500 mg (04/14/19 0300)  . cefTRIAXone (ROCEPHIN)  IV     . citalopram  40 mg Oral Daily  . feeding supplement (PRO-STAT SUGAR FREE 64)  30 mL Oral BID  . furosemide  40 mg Oral Daily  . heparin  5,000 Units Subcutaneous Q8H  . hydrALAZINE  25 mg Oral Q8H  . labetalol  300 mg Oral BID  . NIFEdipine  60 mg Oral BID  . predniSONE  20 mg Oral Daily  . sodium chloride flush  3 mL Intravenous Once  . Vitamin D (Ergocalciferol)  50,000 Units Oral Q Sun     Assessment/Plan: **Pleuritic CP secondary to CAP: RLL consolidation on CT chest.  Being treated for CAP.  Doesn't feel SLE is flaring and with leukocytosis and infiltrate would suspect this is CAP. She  likely can be discharged today with po antibiotics if can maintain sats off supplemental O2 -- defer to primary.   **ESRD on PD:  Recent issues with fill/drain pain so taking break from cycler while heals from surgery--> doing manual exchanges at home.  Labs generally ok today, volume OK and makes urine so we held PD yesterday.  If she discharges today she can do manuals at home as she has been. If she stays she needs to try cycler tonight - 2L fill, 4 cycles over 9hr.  **HTN: on labetalol 300 BID, procardia 60 BID, lasix 40 daily outpt. Allergic to lisinopril. Add hydralazine 25 TID.    **Anemia: Hb 9.2 > 8.6.  No need for transfusion.  Iron indices being checked. ESA outpt per dialysis protocol (she goes weekly)  **BMM: Corr Ca ok.  Check Phos. Not on VDRA or calcimimetic at home.  **Nutrition: alb 3.5, prostat BID.  Will follow, call with questions or concerns. Per above ok to d/c from my perspective.  Jannifer Hick MD 04/14/2019, 10:46 AM  [facility] Pager: [phone]

## 2019-04-14 NOTE — Consult Note (Addendum)
CARDIOLOGY CONSULT NOTE  Patient ID: Latasha Drake MRN: [id number] DOB/AGE: 1980-12-15 39 y.o.  Admit date: 04/12/2019 Referring Physician  Benito Mccreedy, MD Primary Physician:  Medicine, [facility] (Inactive) Reason for Consultation  Pericardial effusion and ? tamponade  Patient ID: Latasha Drake, female    DOB: [birth year], 39 y.o.   MRN: [id number]  Chief Complaint  Patient presents with  . Chest Pain   HPI:    Latasha Drake  is a 39 y.o. Hispanic female with hypertension, systemic lupus erythematosus, chronic anemia, end-stage renal disease on peritoneal dialysis, presented with chest pain for the past 3 to 4 days, was diagnosed with right lower lobe pneumonia and bilateral pleural effusion and small pericardial effusion along with moderate amount of intra-abdominal fluid. She quit smoking in January 2021.  Patient went to see her PCP, due to chest pain and also uncontrolled hypertension was recommended to go to the emergency room.  An echocardiogram obtained revealed pericardial effusion but also tamponade physiology was mentioned in the report hence patient has she was being discharged, a cardiology consultation was obtained for further recommendation regarding the same.  Presently she is feeling better, still complains of left-sided chest discomfort that worsens on taking deep breath or when she sits up, sharp pain that last a few seconds but is easily relieved by staying still.  No hemoptysis, no cough, no fever. Past Medical History:  Diagnosis Date  . Anemia   . Chronic kidney disease   . Hypertension   . Lupus [facility])    Past Surgical History:  Procedure Laterality Date  . CESAREAN SECTION    . HERNIA REPAIR     Social History   Socioeconomic History  . Marital status: Divorced    Spouse name: Not on file  . Number of children: Not on file  . Years of education: Not on file  . Highest education level: Not on file  Occupational History  . Not on  file  Tobacco Use  . Smoking status: Current Every Day Smoker    Packs/day: 0.50    Types: Cigarettes  . Smokeless tobacco: Never Used  Substance and Sexual Activity  . Alcohol use: Yes    Comment: occassionally  . Drug use: No  . Sexual activity: Not on file  Other Topics Concern  . Not on file  Social History Narrative  . Not on file   Social Determinants of Health   Financial Resource Strain:   . Difficulty of Paying Living Expenses: Not on file  Food Insecurity:   . Worried About Charity fundraiser in the Last Year: Not on file  . Ran Out of Food in the Last Year: Not on file  Transportation Needs:   . Lack of Transportation (Medical): Not on file  . Lack of Transportation (Non-Medical): Not on file  Physical Activity:   . Days of Exercise per Week: Not on file  . Minutes of Exercise per Session: Not on file  Stress:   . Feeling of Stress : Not on file  Social Connections:   . Frequency of Communication with Friends and Family: Not on file  . Frequency of Social Gatherings with Friends and Family: Not on file  . Attends Religious Services: Not on file  . Active Member of Clubs or Organizations: Not on file  . Attends Archivist Meetings: Not on file  . Marital Status: Not on file  Intimate Partner Violence:   . Fear of Current or Ex-Partner: Not  on file  . Emotionally Abused: Not on file  . Physically Abused: Not on file  . Sexually Abused: Not on file   ROS  Review of Systems  Constitution: Positive for malaise/fatigue.  Cardiovascular: Positive for chest pain. Negative for dyspnea on exertion and leg swelling.  Respiratory: Negative for cough, hemoptysis and wheezing.   Gastrointestinal: Negative for melena.  All other systems reviewed and are negative.  Objective   Vitals with BMI 04/14/2019 04/14/2019 04/13/2019  Height - - -  Weight - - 167 lbs 11 oz  BMI - - 123XX123  Systolic - Q000111Q Q000111Q  Diastolic - 123XX123 95  Pulse 97 100 108    Blood pressure  (!) 147/100, pulse 97, temperature 98.8 F (37.1 C), temperature source Oral, resp. rate 20, height 5\' 4"  (1.626 m), weight 76.1 kg, last menstrual period 02/24/2019, SpO2 98 %. Body mass index is 28.79 kg/m.    Physical Exam  Constitutional:  She is moderately built and well-nourished in no acute distress.  Neck: No thyromegaly present.  Cardiovascular: Normal rate, regular rhythm, normal heart sounds, intact distal pulses and normal pulses. Exam reveals no gallop.  No murmur heard. No leg edema, no JVD.  No pericardial rub.  Pulmonary/Chest: Effort normal.  Right base bronchial breath sounds, scattered bilateral ronchi present  Abdominal: Soft. Bowel sounds are normal.  Musculoskeletal:        General: Normal range of motion.     Cervical back: Neck supple.  Neurological: She is alert.  Skin: Skin is warm and dry.   Laboratory examination:   Recent Labs    10/05/18 0407 04/12/19 1953 04/14/19 0552  NA 136 141 137  K 4.0 3.9 4.0  CL 104 104 103  CO2 20* 22 19*  GLUCOSE 91 98 94  BUN 39* 83* 89*  CREATININE 3.26* 8.08* 8.30*  CALCIUM 9.1 8.6* 8.2*  GFRNONAA 17* 6* 5*  GFRAA 20* 7* 6*   estimated creatinine clearance is 9 mL/min (A) (by C-G formula based on SCr of 8.3 mg/dL (H)).  CMP Latest Ref Rng & Units 04/14/2019 04/12/2019 10/05/2018  Glucose 70 - 99 mg/dL 94 98 91  BUN 6 - 20 mg/dL 89(H) 83(H) 39(H)  Creatinine 0.44 - 1.00 mg/dL 8.30(H) 8.08(H) 3.26(H)  Sodium 135 - 145 mmol/L 137 141 136  Potassium 3.5 - 5.1 mmol/L 4.0 3.9 4.0  Chloride 98 - 111 mmol/L 103 104 104  CO2 22 - 32 mmol/L 19(L) 22 20(L)  Calcium 8.9 - 10.3 mg/dL 8.2(L) 8.6(L) 9.1  Total Protein 6.5 - 8.1 g/dL - 7.1 6.7  Total Bilirubin 0.3 - 1.2 mg/dL - 0.6 0.4  Alkaline Phos 38 - 126 U/L - 82 77  AST 15 - 41 U/L - 14(L) 12(L)  ALT 0 - 44 U/L - 9 14   CBC Latest Ref Rng & Units 04/13/2019 04/12/2019 10/05/2018  WBC 4.0 - 10.5 K/uL 12.6(H) 14.7(H) 7.4  Hemoglobin 12.0 - 15.0 g/dL 8.6(L) 9.2(L) 8.1(L)   Hematocrit 36.0 - 46.0 % 26.9(L) 27.5(L) 24.1(L)  Platelets 150 - 400 K/uL 479(H) 523(H) 340   Lipid Panel  No results found for: CHOL, TRIG, HDL, CHOLHDL, VLDL, LDLCALC, LDLDIRECT HEMOGLOBIN A1C No results found for: HGBA1C, MPG TSH No results for input(s): TSH in the last 8760 hours. BNP (last 3 results) Recent Labs    04/12/19 1953  BNP 262.8*    Medications and allergies   Allergies  Allergen Reactions  . Lisinopril Swelling     Prior  to Admission medications   Medication Sig Start Date End Date Taking? Authorizing Provider  citalopram (CELEXA) 40 MG tablet Take 40 mg by mouth daily.   Yes [provider]  cyclobenzaprine (FLEXERIL) 10 MG tablet Take 10 mg by mouth 3 (three) times daily as needed for muscle spasms.  04/03/19  Yes [provider]  furosemide (LASIX) 40 MG tablet Take 40 mg by mouth daily. 03/23/19  Yes [provider]  labetalol (NORMODYNE) 300 MG tablet Take 300 mg by mouth 2 (two) times daily.   Yes [provider]  NIFEdipine (PROCARDIA XL/NIFEDICAL XL) 60 MG 24 hr tablet Take 60 mg by mouth 2 (two) times daily. 03/23/19  Yes [provider]  ondansetron (ZOFRAN) 4 MG tablet Take 1 tablet (4 mg total) by mouth every 8 (eight) hours as needed for nausea or vomiting. 05/28/15  Yes Mackuen, Courteney Lyn, MD  pantoprazole (PROTONIX) 40 MG tablet Take 40 mg by mouth daily as needed (indigestion).  10/27/16  Yes [provider]  paragard intrauterine copper IUD IUD 1 Intra Uterine Device by Intrauterine route once.   Yes [provider]  predniSONE (DELTASONE) 20 MG tablet Take 20 mg by mouth daily.  01/23/19  Yes [provider]  traMADol (ULTRAM) 50 MG tablet Take 50 mg by mouth every 6 (six) hours as needed for moderate pain.  03/07/19  Yes [provider]  Amino Acids-Protein Hydrolys (FEEDING SUPPLEMENT, PRO-STAT SUGAR FREE 64,) LIQD Take 30 mLs by mouth 2 (two) times daily. 04/14/19    Benito Mccreedy, MD  amoxicillin-clavulanate (AUGMENTIN) 875-125 MG tablet Take 1 tablet by mouth every 12 (twelve) hours. 04/14/19   Benito Mccreedy, MD  azithromycin (ZITHROMAX) 250 MG tablet Take 1 tablet (250 mg total) by mouth daily. 04/14/19   Benito Mccreedy, MD  citalopram (CELEXA) 40 MG tablet Take 40 mg by mouth daily.  10/27/16 10/04/18  [provider]  gentamicin cream (GARAMYCIN) 0.1 % Apply 1 application topically daily. 04/14/19   Benito Mccreedy, MD  Vitamin D, Ergocalciferol, (DRISDOL) 50000 units CAPS capsule Take 50,000 Units by mouth every Sunday. Take on Sun. 06/23/15   [provider]    . dialysis solution 2.5% low-MG/low-CA      Current Outpatient Medications  Medication Instructions  . Amino Acids-Protein Hydrolys (FEEDING SUPPLEMENT, PRO-STAT SUGAR FREE 64,) LIQD 30 mLs, Oral, 2 times daily  . amoxicillin-clavulanate (AUGMENTIN) 875-125 MG tablet 1 tablet, Oral, Every 12 hours  . azithromycin (ZITHROMAX) 250 mg, Oral, Daily  . citalopram (CELEXA) 40 mg, Oral, Daily  . citalopram (CELEXA) 40 mg, Oral, Daily  . cyclobenzaprine (FLEXERIL) 10 mg, Oral, 3 times daily PRN  . furosemide (LASIX) 40 mg, Oral, Daily  . gentamicin cream (GARAMYCIN) 0.1 % 1 application, Topical, Daily  . labetalol (NORMODYNE) 300 mg, Oral, 2 times daily  . NIFEdipine (PROCARDIA XL/NIFEDICAL XL) 60 mg, Oral, 2 times daily  . ondansetron (ZOFRAN) 4 mg, Oral, Every 8 hours PRN  . pantoprazole (PROTONIX) 40 mg, Oral, Daily PRN  . paragard intrauterine copper IUD IUD 1 Intra Uterine Device, Intrauterine,  Once  . predniSONE (DELTASONE) 20 mg, Oral, Daily  . traMADol (ULTRAM) 50 mg, Oral, Every 6 hours PRN  . Vitamin D (Ergocalciferol) (DRISDOL) 50,000 Units, Oral, Every Sun, Take on Sun.    I/O last 3 completed shifts: In: 1079.3 [P.O.:720; IV Piggyback:359.3] Out: 400 [Urine:400] Total I/O In: 360 [P.O.:360] Out: 100 [Urine:100]    Radiology:   Imaging: DG  Chest 2  View Result Date: 04/12/2019 CLINICAL DATA:  Chest pain, shortness of breath, dialysis EXAM: CHEST - 2 VIEW COMPARISON:  10/04/2018 FINDINGS: Cardiomegaly. Moderate to large right pleural effusion. Small left pleural effusion. The visualized skeletal structures are unremarkable. IMPRESSION: Cardiomegaly with moderate to large right pleural effusion and small left pleural effusion. Electronically Signed   By: Eddie Candle M.D.   On: 04/12/2019 20:01   CT Chest Wo Contrast Result Date: 04/12/2019 1. Moderate severity right lower lobe consolidation. While this is likely secondary to a combination of atelectasis and/or infiltrate, an underlying neoplastic process cannot be excluded. Follow-up to resolution is recommended. 2. Bilateral pleural effusions, right greater than left. 3. Small pericardial effusion which represents a new finding compared to the prior study dated May 28, 2015. 4. Moderate amount of intra-abdominal free fluid. Electronically Signed   By: Virgina Norfolk M.D.   On: 04/12/2019 22:09   Cardiac Studies:   Echocardiogram 04/13/2019:   1. Left ventricular ejection fraction, by visual estimation, is 60 to 65%. The left ventricle has normal function. Left ventricular septal wall thickness was moderately increased. Moderately increased left ventricular posterior wall thickness. There is no left ventricular hypertrophy. The left ventricle has no regional wall motion abnormalities. Left ventricular diastolic parameters are consistent with Grade I diastolic dysfunction (impaired relaxation). 2. RV normal function. Increase in right ventricular wall thickness. 3. Left atrial size was mildly dilated. 4. The tricuspid valve is normal in structure. Tricuspid valve regurgitation is mild. Normal pulmonary artery systolic pressure. 4. The inferior vena cava is normal in size with greater than 50% respiratory variability, suggesting right atrial pressure of 3 mmHg. 5. Mild to moderate  pericardial effusion. The pericardial effusion is circumferential. There is intermittent RV collapse and RA inversion. There is more than 38mmHg change in MV inflow velocities. Although these are findings seen with tamponade, the IVC is normal in diameter and fully collapses with respirations. Clinical correlation recommended.  Echocardiogram independently reviewed: There is no evidence of tamponade by echocardiographic evaluation.  IVC is not dilated with normal respiratory variation.  Doppler flow velocity do not seem to suggest tamponade physiology either.  Pericardial effusion is only mild.  Assessment   1.  Mild pericardial effusion without clinical or echocardiographic evidence of pericardial tamponade. 2.  Musculoskeletal chest pain 3.  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 4.  Hypertension, essential  Recommendations:   Pericardial effusion can be seen in patients with end-stage renal disease, patient is also using peritoneal dialysis and hence peritoneal fluid/ascites was also evident.  From cardiac standpoint I do not see any contraindication for her discharge, no further cardiac evaluation is indicated.  Blood pressure still uncontrolled, would recommend addition of hydralazine 25 mg 3 times daily along with isosorbide dinitrate 30 mg p.o. 3 times daily and to follow-up with her PCP/nephrology.  I will be happy to follow her in the outpatient basis if needed.  Patient does not have any fever, expect that she will develop cough once pneumonia start resolving, afebrile may be related to patient being immunocompromised from lupus.  If consolidation does not resolve, should consider lupus pneumonitis.  Thank you for the consultation.  Adrian Prows, MD, [facility] 04/14/2019, 12:44 PM [facility] [place] [facility] Office: [phone]

## 2019-04-17 ENCOUNTER — Inpatient Hospital Stay (HOSPITAL_COMMUNITY)
Admission: EM | Admit: 2019-04-17 | Discharge: 2019-04-26 | DRG: 673 | Disposition: A | Discharge status: Home Health | Payer: Medicaid Other | Attending: Internal Medicine | Admitting: Internal Medicine

## 2019-04-17 DIAGNOSIS — F17210 Nicotine dependence, cigarettes, uncomplicated: Secondary | ICD-10-CM | POA: Diagnosis present

## 2019-04-17 DIAGNOSIS — E876 Hypokalemia: Secondary | ICD-10-CM | POA: Diagnosis not present

## 2019-04-17 DIAGNOSIS — D649 Anemia, unspecified: Secondary | ICD-10-CM | POA: Diagnosis present

## 2019-04-17 DIAGNOSIS — D631 Anemia in chronic kidney disease: Secondary | ICD-10-CM | POA: Diagnosis present

## 2019-04-17 DIAGNOSIS — T368X5A Adverse effect of other systemic antibiotics, initial encounter: Secondary | ICD-10-CM | POA: Diagnosis not present

## 2019-04-17 DIAGNOSIS — Z888 Allergy status to other drugs, medicaments and biological substances status: Secondary | ICD-10-CM

## 2019-04-17 DIAGNOSIS — R197 Diarrhea, unspecified: Secondary | ICD-10-CM | POA: Diagnosis not present

## 2019-04-17 DIAGNOSIS — Z79899 Other long term (current) drug therapy: Secondary | ICD-10-CM

## 2019-04-17 DIAGNOSIS — D638 Anemia in other chronic diseases classified elsewhere: Secondary | ICD-10-CM | POA: Diagnosis present

## 2019-04-17 DIAGNOSIS — Z9889 Other specified postprocedural states: Secondary | ICD-10-CM

## 2019-04-17 DIAGNOSIS — Z7952 Long term (current) use of systemic steroids: Secondary | ICD-10-CM

## 2019-04-17 DIAGNOSIS — IMO0002 Reserved for concepts with insufficient information to code with codable children: Secondary | ICD-10-CM | POA: Diagnosis present

## 2019-04-17 DIAGNOSIS — M329 Systemic lupus erythematosus, unspecified: Secondary | ICD-10-CM | POA: Diagnosis present

## 2019-04-17 DIAGNOSIS — J9601 Acute respiratory failure with hypoxia: Secondary | ICD-10-CM | POA: Diagnosis present

## 2019-04-17 DIAGNOSIS — Z992 Dependence on renal dialysis: Secondary | ICD-10-CM

## 2019-04-17 DIAGNOSIS — J90 Pleural effusion, not elsewhere classified: Secondary | ICD-10-CM

## 2019-04-17 DIAGNOSIS — I120 Hypertensive chronic kidney disease with stage 5 chronic kidney disease or end stage renal disease: Principal | ICD-10-CM | POA: Diagnosis present

## 2019-04-17 DIAGNOSIS — N186 End stage renal disease: Secondary | ICD-10-CM | POA: Diagnosis present

## 2019-04-17 DIAGNOSIS — N2581 Secondary hyperparathyroidism of renal origin: Secondary | ICD-10-CM | POA: Diagnosis present

## 2019-04-17 DIAGNOSIS — R079 Chest pain, unspecified: Secondary | ICD-10-CM | POA: Diagnosis present

## 2019-04-17 DIAGNOSIS — K219 Gastro-esophageal reflux disease without esophagitis: Secondary | ICD-10-CM | POA: Diagnosis present

## 2019-04-17 DIAGNOSIS — M549 Dorsalgia, unspecified: Secondary | ICD-10-CM

## 2019-04-17 DIAGNOSIS — D849 Immunodeficiency, unspecified: Secondary | ICD-10-CM | POA: Diagnosis present

## 2019-04-17 DIAGNOSIS — M3214 Glomerular disease in systemic lupus erythematosus: Secondary | ICD-10-CM | POA: Diagnosis present

## 2019-04-17 DIAGNOSIS — J189 Pneumonia, unspecified organism: Secondary | ICD-10-CM | POA: Diagnosis present

## 2019-04-17 DIAGNOSIS — Z975 Presence of (intrauterine) contraceptive device: Secondary | ICD-10-CM

## 2019-04-17 DIAGNOSIS — Z20822 Contact with and (suspected) exposure to covid-19: Secondary | ICD-10-CM | POA: Diagnosis present

## 2019-04-17 DIAGNOSIS — R0781 Pleurodynia: Secondary | ICD-10-CM | POA: Diagnosis present

## 2019-04-17 DIAGNOSIS — I313 Pericardial effusion (noninflammatory): Secondary | ICD-10-CM | POA: Diagnosis present

## 2019-04-17 DIAGNOSIS — I10 Essential (primary) hypertension: Secondary | ICD-10-CM | POA: Diagnosis present

## 2019-04-17 DIAGNOSIS — R0602 Shortness of breath: Secondary | ICD-10-CM

## 2019-04-17 DIAGNOSIS — Z79891 Long term (current) use of opiate analgesic: Secondary | ICD-10-CM

## 2019-04-17 LAB — CULTURE, BLOOD (ROUTINE X 2)
Culture: NO GROWTH
Culture: NO GROWTH
Special Requests: ADEQUATE
Special Requests: ADEQUATE

## 2019-04-18 ENCOUNTER — Emergency Department (HOSPITAL_COMMUNITY): Payer: Medicaid Other

## 2019-04-18 ENCOUNTER — Encounter (HOSPITAL_COMMUNITY): Payer: Self-pay | Admitting: Emergency Medicine

## 2019-04-18 ENCOUNTER — Other Ambulatory Visit: Payer: Self-pay

## 2019-04-18 ENCOUNTER — Observation Stay (HOSPITAL_BASED_OUTPATIENT_CLINIC_OR_DEPARTMENT_OTHER): Payer: Medicaid Other

## 2019-04-18 DIAGNOSIS — I10 Essential (primary) hypertension: Secondary | ICD-10-CM | POA: Diagnosis not present

## 2019-04-18 DIAGNOSIS — R7989 Other specified abnormal findings of blood chemistry: Secondary | ICD-10-CM | POA: Diagnosis not present

## 2019-04-18 DIAGNOSIS — J9601 Acute respiratory failure with hypoxia: Secondary | ICD-10-CM | POA: Diagnosis not present

## 2019-04-18 DIAGNOSIS — R071 Chest pain on breathing: Secondary | ICD-10-CM | POA: Diagnosis not present

## 2019-04-18 DIAGNOSIS — D649 Anemia, unspecified: Secondary | ICD-10-CM

## 2019-04-18 DIAGNOSIS — N186 End stage renal disease: Secondary | ICD-10-CM | POA: Diagnosis not present

## 2019-04-18 LAB — CBC
HCT: 25.3 % — ABNORMAL LOW (ref 36.0–46.0)
Hemoglobin: 8.3 g/dL — ABNORMAL LOW (ref 12.0–15.0)
MCH: 22.8 pg — ABNORMAL LOW (ref 26.0–34.0)
MCHC: 32.8 g/dL (ref 30.0–36.0)
MCV: 69.5 fL — ABNORMAL LOW (ref 80.0–100.0)
Platelets: 463 10*3/uL — ABNORMAL HIGH (ref 150–400)
RBC: 3.64 MIL/uL — ABNORMAL LOW (ref 3.87–5.11)
RDW: 17.1 % — ABNORMAL HIGH (ref 11.5–15.5)
WBC: 9.4 10*3/uL (ref 4.0–10.5)
nRBC: 0 % (ref 0.0–0.2)

## 2019-04-18 LAB — BASIC METABOLIC PANEL
Anion gap: 17 — ABNORMAL HIGH (ref 5–15)
BUN: 70 mg/dL — ABNORMAL HIGH (ref 6–20)
CO2: 22 mmol/L (ref 22–32)
Calcium: 8 mg/dL — ABNORMAL LOW (ref 8.9–10.3)
Chloride: 98 mmol/L (ref 98–111)
Creatinine, Ser: 9.14 mg/dL — ABNORMAL HIGH (ref 0.44–1.00)
GFR calc Af Amer: 6 mL/min — ABNORMAL LOW (ref >60)
GFR calc non Af Amer: 5 mL/min — ABNORMAL LOW (ref >60)
Glucose, Bld: 109 mg/dL — ABNORMAL HIGH (ref 70–99)
Potassium: 3.3 mmol/L — ABNORMAL LOW (ref 3.5–5.1)
Sodium: 137 mmol/L (ref 135–145)

## 2019-04-18 LAB — LACTIC ACID, PLASMA
Lactic Acid, Venous: 1 mmol/L (ref 0.5–1.9)
Lactic Acid, Venous: 0.7 mmol/L (ref 0.5–1.9)

## 2019-04-18 LAB — PROTIME-INR
INR: 1 (ref 0.8–1.2)
Prothrombin Time: 13.5 seconds (ref 11.4–15.2)

## 2019-04-18 LAB — SARS CORONAVIRUS 2 (TAT 6-24 HRS): SARS Coronavirus 2: NEGATIVE

## 2019-04-18 LAB — SEDIMENTATION RATE: Sed Rate: 55 mm/hr — ABNORMAL HIGH (ref 0–22)

## 2019-04-18 LAB — TROPONIN I (HIGH SENSITIVITY): Troponin I (High Sensitivity): 16 ng/L (ref <18)

## 2019-04-18 LAB — D-DIMER, QUANTITATIVE: D-Dimer, Quant: 13.31 ug/mL-FEU — ABNORMAL HIGH (ref 0.00–0.50)

## 2019-04-18 LAB — I-STAT BETA HCG BLOOD, ED (MC, WL, AP ONLY): I-stat hCG, quantitative: <5 m[IU]/mL (ref <5)

## 2019-04-18 LAB — HEPARIN LEVEL (UNFRACTIONATED): Heparin Unfractionated: 0.3 IU/mL (ref 0.30–0.70)

## 2019-04-18 MED ORDER — OXYCODONE HCL 5 MG PO TABS
5.0000 mg | ORAL_TABLET | ORAL | Status: DC | PRN
  Administered 2019-04-18 – 2019-04-25 (×21): 5 mg via ORAL
  Filled 2019-04-18 (×20): qty 1

## 2019-04-18 MED ORDER — HEPARIN BOLUS VIA INFUSION
2000.0000 [IU] | Freq: Once | INTRAVENOUS | Status: AC
  Administered 2019-04-18: 07:00:00 2000 [IU] via INTRAVENOUS
  Filled 2019-04-18: qty 2000

## 2019-04-18 MED ORDER — VANCOMYCIN HCL IN DEXTROSE 1-5 GM/200ML-% IV SOLN
1000.0000 mg | Freq: Once | INTRAVENOUS | Status: DC
  Filled 2019-04-18: qty 200

## 2019-04-18 MED ORDER — IPRATROPIUM-ALBUTEROL 0.5-2.5 (3) MG/3ML IN SOLN
3.0000 mL | Freq: Two times a day (BID) | RESPIRATORY_TRACT | Status: DC
  Administered 2019-04-19 – 2019-04-21 (×6): 3 mL via RESPIRATORY_TRACT
  Filled 2019-04-18 (×6): qty 3

## 2019-04-18 MED ORDER — FENTANYL CITRATE (PF) 100 MCG/2ML IJ SOLN
100.0000 ug | Freq: Once | INTRAMUSCULAR | Status: AC
  Administered 2019-04-18: 100 ug via INTRAVENOUS
  Filled 2019-04-18: qty 2

## 2019-04-18 MED ORDER — HEPARIN (PORCINE) 25000 UT/250ML-% IV SOLN
1150.0000 [IU]/h | INTRAVENOUS | Status: DC
  Administered 2019-04-18: 07:00:00 1100 [IU]/h via INTRAVENOUS
  Administered 2019-04-19 – 2019-04-20 (×2): 1150 [IU]/h via INTRAVENOUS
  Filled 2019-04-18 (×3): qty 250

## 2019-04-18 MED ORDER — HEPARIN 1000 UNIT/ML FOR PERITONEAL DIALYSIS
Freq: Once | INTRAPERITONEAL | Status: DC
  Filled 2019-04-18: qty 3000

## 2019-04-18 MED ORDER — HEPARIN 1000 UNIT/ML FOR PERITONEAL DIALYSIS: 500.0000 [IU] | INTRAMUSCULAR | Status: DC | PRN

## 2019-04-18 MED ORDER — CYCLOBENZAPRINE HCL 10 MG PO TABS
10.0000 mg | ORAL_TABLET | Freq: Three times a day (TID) | ORAL | Status: DC | PRN
  Administered 2019-04-24 – 2019-04-25 (×3): 10 mg via ORAL
  Filled 2019-04-18 (×3): qty 1

## 2019-04-18 MED ORDER — DIPHENHYDRAMINE HCL 50 MG/ML IJ SOLN
25.0000 mg | Freq: Four times a day (QID) | INTRAMUSCULAR | Status: DC | PRN
  Administered 2019-04-19: 25 mg via INTRAVENOUS
  Filled 2019-04-18: qty 1

## 2019-04-18 MED ORDER — PANTOPRAZOLE SODIUM 40 MG PO TBEC: 40.0000 mg | DELAYED_RELEASE_TABLET | Freq: Every day | ORAL | Status: DC | PRN

## 2019-04-18 MED ORDER — FUROSEMIDE 40 MG PO TABS
40.0000 mg | ORAL_TABLET | Freq: Every day | ORAL | Status: DC
  Administered 2019-04-18: 40 mg via ORAL
  Filled 2019-04-18: qty 1

## 2019-04-18 MED ORDER — SODIUM CHLORIDE 0.9 % IV SOLN
1.0000 g | INTRAVENOUS | Status: DC
  Administered 2019-04-18 – 2019-04-22 (×5): 1 g via INTRAVENOUS
  Filled 2019-04-18 (×8): qty 1

## 2019-04-18 MED ORDER — METHYLPREDNISOLONE SODIUM SUCC 40 MG IJ SOLR
40.0000 mg | Freq: Two times a day (BID) | INTRAMUSCULAR | Status: DC
  Administered 2019-04-18 – 2019-04-22 (×9): 40 mg via INTRAVENOUS
  Filled 2019-04-18 (×10): qty 1

## 2019-04-18 MED ORDER — PREDNISONE 20 MG PO TABS
20.0000 mg | ORAL_TABLET | Freq: Every day | ORAL | Status: DC
  Administered 2019-04-18: 20 mg via ORAL
  Filled 2019-04-18: qty 1

## 2019-04-18 MED ORDER — IPRATROPIUM-ALBUTEROL 0.5-2.5 (3) MG/3ML IN SOLN
3.0000 mL | Freq: Four times a day (QID) | RESPIRATORY_TRACT | Status: DC
  Administered 2019-04-18: 20:00:00 3 mL via RESPIRATORY_TRACT
  Filled 2019-04-18: qty 3

## 2019-04-18 MED ORDER — ALBUTEROL SULFATE (2.5 MG/3ML) 0.083% IN NEBU: 2.5000 mg | INHALATION_SOLUTION | RESPIRATORY_TRACT | Status: DC | PRN

## 2019-04-18 MED ORDER — ALBUTEROL SULFATE HFA 108 (90 BASE) MCG/ACT IN AERS
8.0000 | INHALATION_SPRAY | Freq: Once | RESPIRATORY_TRACT | Status: AC
  Administered 2019-04-18: 8 via RESPIRATORY_TRACT
  Filled 2019-04-18: qty 6.7

## 2019-04-18 MED ORDER — NIFEDIPINE ER OSMOTIC RELEASE 60 MG PO TB24
60.0000 mg | ORAL_TABLET | Freq: Two times a day (BID) | ORAL | Status: DC
  Administered 2019-04-18 – 2019-04-23 (×10): 60 mg via ORAL
  Filled 2019-04-18 (×12): qty 1

## 2019-04-18 MED ORDER — DELFLEX-LM/2.5% DEXTROSE 396 MOSM/L IP SOLN: Freq: Once | INTRAPERITONEAL | Status: DC

## 2019-04-18 MED ORDER — HYDRALAZINE HCL 50 MG PO TABS
50.0000 mg | ORAL_TABLET | Freq: Three times a day (TID) | ORAL | Status: DC
  Administered 2019-04-18 (×2): 50 mg via ORAL
  Filled 2019-04-18 (×2): qty 1

## 2019-04-18 MED ORDER — VANCOMYCIN HCL 1500 MG/300ML IV SOLN
1500.0000 mg | Freq: Once | INTRAVENOUS | Status: AC
  Administered 2019-04-18: 1500 mg via INTRAVENOUS
  Filled 2019-04-18: qty 300

## 2019-04-18 MED ORDER — ACETAMINOPHEN 650 MG RE SUPP: 650.0000 mg | Freq: Four times a day (QID) | RECTAL | Status: DC | PRN

## 2019-04-18 MED ORDER — FUROSEMIDE 10 MG/ML IJ SOLN
40.0000 mg | Freq: Two times a day (BID) | INTRAMUSCULAR | Status: DC
  Administered 2019-04-18 – 2019-04-21 (×5): 40 mg via INTRAVENOUS
  Filled 2019-04-18 (×6): qty 4

## 2019-04-18 MED ORDER — HYDRALAZINE HCL 50 MG PO TABS
50.0000 mg | ORAL_TABLET | Freq: Two times a day (BID) | ORAL | Status: DC
  Administered 2019-04-18: 50 mg via ORAL
  Filled 2019-04-18: qty 1

## 2019-04-18 MED ORDER — DELFLEX-LC/2.5% DEXTROSE 394 MOSM/L IP SOLN: Freq: Once | INTRAPERITONEAL | Status: DC

## 2019-04-18 MED ORDER — DELFLEX-LC/2.5% DEXTROSE 394 MOSM/L IP SOLN
Freq: Once | INTRAPERITONEAL | Status: AC
  Administered 2019-04-18: 2000 mL via INTRAPERITONEAL

## 2019-04-18 MED ORDER — LABETALOL HCL 300 MG PO TABS
300.0000 mg | ORAL_TABLET | Freq: Two times a day (BID) | ORAL | Status: DC
  Administered 2019-04-18 – 2019-04-26 (×16): 300 mg via ORAL
  Filled 2019-04-18 (×17): qty 1

## 2019-04-18 MED ORDER — HEPARIN SODIUM (PORCINE) 5000 UNIT/ML IJ SOLN: 5000.0000 [IU] | Freq: Three times a day (TID) | INTRAMUSCULAR | Status: DC

## 2019-04-18 MED ORDER — HYDROMORPHONE HCL 1 MG/ML IJ SOLN
0.5000 mg | INTRAMUSCULAR | Status: DC | PRN
  Administered 2019-04-18 – 2019-04-26 (×36): 0.5 mg via INTRAVENOUS
  Filled 2019-04-18 (×35): qty 1

## 2019-04-18 MED ORDER — SODIUM CHLORIDE 0.9% FLUSH: 3.0000 mL | Freq: Once | INTRAVENOUS | Status: DC

## 2019-04-18 MED ORDER — NAPROXEN 250 MG PO TABS
250.0000 mg | ORAL_TABLET | Freq: Two times a day (BID) | ORAL | Status: DC
  Filled 2019-04-18: qty 1

## 2019-04-18 MED ORDER — GENTAMICIN SULFATE 0.1 % EX CREA
1.0000 "application " | TOPICAL_CREAM | Freq: Every day | CUTANEOUS | Status: DC
  Administered 2019-04-18 – 2019-04-21 (×4): 1 via TOPICAL
  Filled 2019-04-18: qty 15

## 2019-04-18 MED ORDER — ACETAMINOPHEN 325 MG PO TABS: 650.0000 mg | ORAL_TABLET | Freq: Four times a day (QID) | ORAL | Status: DC | PRN

## 2019-04-18 MED ORDER — ONDANSETRON HCL 4 MG/2ML IJ SOLN
4.0000 mg | Freq: Four times a day (QID) | INTRAMUSCULAR | Status: DC | PRN
  Administered 2019-04-19 – 2019-04-24 (×6): 4 mg via INTRAVENOUS
  Filled 2019-04-18 (×6): qty 2

## 2019-04-18 NOTE — Consult Note (Signed)
NAME:  Latasha Drake, MRN:  [id number], DOB:  1980/06/15, LOS: 0 ADMISSION DATE:  04/17/2019, CONSULTATION DATE:  04/18/19 REFERRING MD:  Jeneen Rinks  CHIEF COMPLAINT:  Dyspnea   Brief History   Latasha Drake is a 39 y.o. female who was admitted 2/11 with dyspnea and pleuritic chest pain.    History of present illness   [name] is a 39 y.o. female who has a PMH including but not limited to HTN, ESRD on home PD, SLE, chronic anemia (see "past medical history" for rest).  She presented to [facility] ED 04/17/19 with dyspnea and pleuritic chest pain both left and right sides and radiating sub sternum into lower back.  Pain described as sharp and worse with inspiration.  She reported dyspnea in ED, SpO2 was 98% at rest; however, dipped to 80's with ambulation.  No recent fevers/chills/sweats, cough, N/V/D, abd pain, myalgias, exposures to known sick contacts. In ED, CXR showed unchanged RLL consolidation with small right effusion.  D-dimer elevated at 13.31, trop 16.  She was admitted by [facility] and was started on empiric heparin.  PCCM was asked to see in AM 2/11 for further recs on effusion.  She had admission to [facility] 04/12/19 through 04/14/19 for RLL PNA.  She had VQ during that admit which was intermediate risk for PE (low end).  Past Medical History  has Acute on chronic blood loss anemia; Lupus ([place]); Hypertension; Chronic kidney disease; Acute renal failure superimposed on chronic kidney disease ([place]); Elevated d-dimer; Pericardial effusion; Traumatic perinephric hematoma of left kidney; Symptomatic anemia; Pneumonia; Chest pain; and ESRD (end stage renal disease) ([place]) on their problem list.  Significant Hospital Events   2/11 > admit.  Consults:  PCCM, nephrology.  Procedures:  None.  Significant Diagnostic Tests:  CXR 2/10 > unchanged RLL consolidation with small effusion.  Micro Data:  SARS 2/11 > neg.  Antimicrobials:  Cefepime 2/11 >   Interim history/subjective:  Has ongoing dyspnea with  expiratory wheezing.  Objective:  Blood pressure (!) 180/111, pulse (!) 108, temperature 98.1 F (36.7 C), temperature source Oral, resp. rate (!) 22, last menstrual period 02/24/2019, SpO2 99 %.       No intake or output data in the 24 hours ending 04/18/19 1132 There were no vitals filed for this visit.  Examination: General: Adult female, resting in bed, in NAD. Neuro: A&O x 3, no deficits. HEENT: [place]/AT. Sclerae anicteric. EOMI. Cardiovascular: RRR, no M/R/G.  Lungs: Respirations even and unlabored.  Expiratory wheezes bilaterally. Abdomen: BS x 4, soft, NT/ND.  Musculoskeletal: No gross deformities, LLE edema > RLE. Skin: Intact, warm, no rashes.  Assessment & Plan:   Acute hypoxic respiratory failure - presumed primarily 2/2 splinting / alveolar hypoventilation from pleuritic chest pain.  Agree possible PE given elevated D-dimer.  CXR also appears to have some mild edema per my read.  Also some possibility in part due to component of effusion though favor atelectasis more.  Lastly, can't rule out ongoing PNA. - Continue supplemental O2 as needed to maintain SpO2 > 92%. - Agree with empiric heparin for now. - F/u on LE duplex. - Start 40mg  lasix BID (she takes 40mg  daily at home and makes around 300cc urine / day on that regimen), hope for increased UOP and net neg balance. - Solumedrol in lieu of home prednisone. - Albuterol PRN. - Continue empiric cefepime. - If no improvement despite above measures then can review chest with ultrasound to assess if there is an appreciable effusion with  safe pocket to tap.  Pleuritic chest pain - unclear etiology but in light of underlying SLE hx, there is some concern for pleural inflammation.  - Empiric heparin as above. - Assess ANA, dsDNA, C3, C4. - Consider empiric naproxen 250mg  q12hrs (held for now due to renal failure). - Solumedrol in lieu of home prednisone as above. - Consider repeat echo given that one on previous admission had  some mention of effusion with possible tamponade physiology (could effusion have enlarged, could she now have pericarditis despite EKG now showing classic findings?).  Best Practice:  Diet: Renal. Pain/Anxiety/Delirium protocol (if indicated): N/A. VAP protocol (if indicated): N/A. DVT prophylaxis: Heparin gtt. GI prophylaxis: N/A. Glucose control: N/A. Mobility: Up with assist. Code Status: Full. Family Communication: Per primary. Disposition: Tele.  Labs   CBC: Recent Labs  Lab 04/12/19 1953 04/13/19 1143 04/18/19 0010  WBC 14.7* 12.6* 9.4  HGB 9.2* 8.6* 8.3*  HCT 27.5* 26.9* 25.3*  MCV 69.6* 70.8* 69.5*  PLT 523* 479* Q000111Q*   Basic Metabolic Panel: Recent Labs  Lab 04/12/19 1953 04/13/19 1143 04/14/19 0552 04/18/19 0010  NA 141  --  137 137  K 3.9  --  4.0 3.3*  CL 104  --  103 98  CO2 22  --  19* 22  GLUCOSE 98  --  94 109*  BUN 83*  --  89* 70*  CREATININE 8.08*  --  8.30* 9.14*  CALCIUM 8.6*  --  8.2* 8.0*  PHOS  --  5.4* 6.8*  --    GFR: Estimated Creatinine Clearance: 8.2 mL/min (A) (by C-G formula based on SCr of 9.14 mg/dL (H)). Recent Labs  Lab 04/12/19 1953 04/12/19 2116 04/12/19 2220 04/13/19 1143 04/18/19 0010 04/18/19 0404 04/18/19 0543  WBC 14.7*  --   --  12.6* 9.4  --   --   LATICACIDVEN  --  1.1 0.8  --   --  1.0 0.7   Liver Function Tests: Recent Labs  Lab 04/12/19 1953 04/14/19 0552  AST 14*  --   ALT 9  --   ALKPHOS 82  --   BILITOT 0.6  --   PROT 7.1  --   ALBUMIN 3.5 2.7*   Recent Labs  Lab 04/12/19 1953  LIPASE 59*   No results for input(s): AMMONIA in the last 168 hours. ABG No results found for: PHART, PCO2ART, PO2ART, HCO3, TCO2, ACIDBASEDEF, O2SAT  Coagulation Profile: Recent Labs  Lab 04/18/19 0010  INR 1.0   Cardiac Enzymes: No results for input(s): CKTOTAL, CKMB, CKMBINDEX, TROPONINI in the last 168 hours. HbA1C: No results found for: HGBA1C CBG: No results for input(s): GLUCAP in the last 168  hours.  Review of Systems:   All negative; except for those that are bolded, which indicate positives.  Constitutional: weight loss, weight gain, night sweats, fevers, chills, fatigue, weakness.  HEENT: headaches, sore throat, sneezing, nasal congestion, post nasal drip, difficulty swallowing, tooth/dental problems, visual complaints, visual changes, ear aches. Neuro: difficulty with speech, weakness, numbness, ataxia. CV:  chest pain, orthopnea, PND, swelling in lower extremities, dizziness, palpitations, syncope.  Resp: cough, hemoptysis, dyspnea, wheezing. GI: heartburn, indigestion, abdominal pain, nausea, vomiting, diarrhea, constipation, change in bowel habits, loss of appetite, hematemesis, melena, hematochezia.  GU: dysuria, change in color of urine, urgency or frequency, flank pain, hematuria. MSK: joint pain or swelling, decreased range of motion. Psych: change in mood or affect, depression, anxiety, suicidal ideations, homicidal ideations. Skin: rash, itching, bruising.   Past medical  history  She,  has a past medical history of Anemia, Chronic kidney disease, Hypertension, and Lupus ([place]).   Surgical History    Past Surgical History:  Procedure Laterality Date   CESAREAN SECTION     HERNIA REPAIR       Social History   reports that she has been smoking cigarettes. She has been smoking about 0.50 packs per day. She has never used smokeless tobacco. She reports current alcohol use. She reports that she does not use drugs.   Family history   Her family history includes Lupus in an other family member.   Allergies Allergies  Allergen Reactions   Lisinopril Swelling     Home meds  Prior to Admission medications   Medication Sig Start Date End Date Taking? Authorizing Provider  amoxicillin-clavulanate (AUGMENTIN) 875-125 MG tablet Take 1 tablet by mouth every 12 (twelve) hours. 04/14/19  Yes Osei-Bonsu, Iona Beard, MD  azithromycin (ZITHROMAX) 250 MG tablet Take 1  tablet (250 mg total) by mouth daily. 04/14/19  Yes Osei-Bonsu, Iona Beard, MD  cyclobenzaprine (FLEXERIL) 10 MG tablet Take 10 mg by mouth 3 (three) times daily as needed for muscle spasms.  04/03/19  Yes [provider]  furosemide (LASIX) 40 MG tablet Take 40 mg by mouth daily. 03/23/19  Yes [provider]  gentamicin cream (GARAMYCIN) 0.1 % Apply 1 application topically daily. 04/14/19  Yes Osei-Bonsu, Iona Beard, MD  hydrALAZINE (APRESOLINE) 50 MG tablet Take 50 mg by mouth 2 (two) times daily. 04/09/19  Yes [provider]  labetalol (NORMODYNE) 300 MG tablet Take 300 mg by mouth 2 (two) times daily.   Yes [provider]  NIFEdipine (PROCARDIA XL/NIFEDICAL XL) 60 MG 24 hr tablet Take 60 mg by mouth 2 (two) times daily. 03/23/19  Yes [provider]  ondansetron (ZOFRAN) 4 MG tablet Take 1 tablet (4 mg total) by mouth every 8 (eight) hours as needed for nausea or vomiting. 05/28/15  Yes Mackuen, Courteney Lyn, MD  pantoprazole (PROTONIX) 40 MG tablet Take 40 mg by mouth daily as needed (indigestion).  10/27/16  Yes [provider]  paragard intrauterine copper IUD IUD 1 Intra Uterine Device by Intrauterine route once.   Yes [provider]  predniSONE (DELTASONE) 20 MG tablet Take 20 mg by mouth daily.  01/23/19  Yes [provider]  Vitamin D, Ergocalciferol, (DRISDOL) 50000 units CAPS capsule Take 50,000 Units by mouth every Sunday. Take on Sun. 06/23/15  Yes [provider]  Amino Acids-Protein Hydrolys (FEEDING SUPPLEMENT, PRO-STAT SUGAR FREE 64,) LIQD Take 30 mLs by mouth 2 (two) times daily. Patient not taking: Reported on 04/18/2019 04/14/19   Benito Mccreedy, MD    Montey Hora, [facility] 04/18/2019, 2:17 PM

## 2019-04-18 NOTE — ED Notes (Signed)
Admitting paged to RN per their request 

## 2019-04-18 NOTE — ED Notes (Addendum)
Ambulate pt. Pt SpO2 maintained 96-97% on room air. Reports worsening pain with ambulation

## 2019-04-18 NOTE — Progress Notes (Signed)
[facility] RN administered pt peritoneal Dialysis, fluid currently dwelling. Pt tolerated well, no heparin was used. Will not let me scan off MAR because it the computer is not allowing me to connect the catheter line.

## 2019-04-18 NOTE — ED Provider Notes (Signed)
[facility] EMERGENCY DEPARTMENT Provider Note   CSN: [id number] Arrival date & time: 04/17/19  2359     History Chief Complaint  Patient presents with  . Ribcage Pain    Latasha Drake is a 39 y.o. female.  The history is provided by the patient.  Chest Pain Pain location:  L chest and R chest Pain quality: aching   Pain radiates to:  Does not radiate Pain severity:  Severe Onset quality:  Sudden Duration:  1 day Timing:  Constant Progression:  Worsening Chronicity:  Recurrent Relieved by:  Nothing Worsened by:  Movement and certain positions Associated symptoms: shortness of breath   Associated symptoms: no cough, no fever and no vomiting   Patient with history of lupus, ESRD on peritoneal dialysis presents with bilateral chest pain.  This reports a started yesterday with mild shortness of breath.  No fevers or cough.  Similar to prior pain. No recent changes in her dialysis. Patient was admitted recently     Past Medical History:  Diagnosis Date  . Anemia   . Chronic kidney disease   . Hypertension   . Lupus [facility])     Patient Active Problem List   Diagnosis Date Noted  . Chest pain 04/13/2019  . ESRD (end stage renal disease) ([place]) 04/13/2019  . Pneumonia 04/12/2019  . Acute on chronic blood loss anemia 10/04/2018  . Symptomatic anemia 10/04/2018  . Lupus ([place])   . Hypertension   . Chronic kidney disease   . Acute renal failure superimposed on chronic kidney disease ([place])   . Elevated d-dimer   . Pericardial effusion   . Traumatic perinephric hematoma of left kidney     Past Surgical History:  Procedure Laterality Date  . CESAREAN SECTION    . HERNIA REPAIR       OB History   No obstetric history on file.     Family History  Problem Relation Age of Onset  . Lupus Other        cousin    Social History   Tobacco Use  . Smoking status: Current Every Day Smoker    Packs/day: 0.50    Types: Cigarettes  . Smokeless  tobacco: Never Used  Substance Use Topics  . Alcohol use: Yes    Comment: occassionally  . Drug use: No    Home Medications Prior to Admission medications   Medication Sig Start Date End Date Taking? Authorizing Provider  Amino Acids-Protein Hydrolys (FEEDING SUPPLEMENT, PRO-STAT SUGAR FREE 64,) LIQD Take 30 mLs by mouth 2 (two) times daily. 04/14/19   Benito Mccreedy, MD  amoxicillin-clavulanate (AUGMENTIN) 875-125 MG tablet Take 1 tablet by mouth every 12 (twelve) hours. 04/14/19   Benito Mccreedy, MD  azithromycin (ZITHROMAX) 250 MG tablet Take 1 tablet (250 mg total) by mouth daily. 04/14/19   Benito Mccreedy, MD  citalopram (CELEXA) 40 MG tablet Take 40 mg by mouth daily.  10/27/16 10/04/18  [provider]  cyclobenzaprine (FLEXERIL) 10 MG tablet Take 10 mg by mouth 3 (three) times daily as needed for muscle spasms.  04/03/19   [provider]  furosemide (LASIX) 40 MG tablet Take 40 mg by mouth daily. 03/23/19   [provider]  gentamicin cream (GARAMYCIN) 0.1 % Apply 1 application topically daily. 04/14/19   Benito Mccreedy, MD  labetalol (NORMODYNE) 300 MG tablet Take 300 mg by mouth 2 (two) times daily.    [provider]  NIFEdipine (PROCARDIA XL/NIFEDICAL XL) 60 MG 24 hr  tablet Take 60 mg by mouth 2 (two) times daily. 03/23/19   [provider]  ondansetron (ZOFRAN) 4 MG tablet Take 1 tablet (4 mg total) by mouth every 8 (eight) hours as needed for nausea or vomiting. 05/28/15   Mackuen, Courteney Lyn, MD  pantoprazole (PROTONIX) 40 MG tablet Take 40 mg by mouth daily as needed (indigestion).  10/27/16   [provider]  paragard intrauterine copper IUD IUD 1 Intra Uterine Device by Intrauterine route once.    [provider]  predniSONE (DELTASONE) 20 MG tablet Take 20 mg by mouth daily.  01/23/19   [provider]  traMADol (ULTRAM) 50 MG tablet Take 50 mg by mouth every 6 (six) hours as needed for moderate  pain.  03/07/19   [provider]  Vitamin D, Ergocalciferol, (DRISDOL) 50000 units CAPS capsule Take 50,000 Units by mouth every Sunday. Take on Sun. 06/23/15   [provider]    Allergies    Lisinopril  Review of Systems   Review of Systems  Constitutional: Negative for fever.  Respiratory: Positive for shortness of breath. Negative for cough.   Cardiovascular: Positive for chest pain.  Gastrointestinal: Negative for vomiting.  All other systems reviewed and are negative.   Physical Exam Updated Vital Signs BP 131/89 (BP Location: Right Arm)   Pulse 91   Temp 97.8 F (36.6 C) (Oral)   Resp 18   LMP 02/24/2019   SpO2 98%   Physical Exam CONSTITUTIONAL: Well developed/well nourished, uncomfortable appearing HEAD: Normocephalic/atraumatic EYES: EOMI/PERRL ENMT: Mucous membranes moist NECK: supple no meningeal signs SPINE/BACK:entire spine nontender CV: S1/S2 noted, no murmurs/rubs/gallops noted LUNGS: Tachypnea, decreased breath sounds in the right ABDOMEN: soft, nontender, no rebound or guarding, bowel sounds noted throughout abdomen, PD catheter noted without tenderness or drainage GU:no cva tenderness NEURO: Pt is awake/alert/appropriate, moves all extremitiesx4.  No facial droop.   EXTREMITIES: pulses normal/equal, full ROM SKIN: warm, color normal PSYCH: no abnormalities of mood noted, alert and oriented to situation  ED Results / Procedures / Treatments   Labs (all labs ordered are listed, but only abnormal results are displayed) Labs Reviewed  BASIC METABOLIC PANEL - Abnormal; Notable for the following components:      Result Value   Potassium 3.3 (*)    Glucose, Bld 109 (*)    BUN 70 (*)    Creatinine, Ser 9.14 (*)    Calcium 8.0 (*)    GFR calc non Af Amer 5 (*)    GFR calc Af Amer 6 (*)    Anion gap 17 (*)    All other components within normal limits  CBC - Abnormal; Notable for the following components:   RBC 3.64 (*)     Hemoglobin 8.3 (*)    HCT 25.3 (*)    MCV 69.5 (*)    MCH 22.8 (*)    RDW 17.1 (*)    Platelets 463 (*)    All other components within normal limits  SARS CORONAVIRUS 2 (TAT 6-24 HRS)  CULTURE, BLOOD (ROUTINE X 2)  CULTURE, BLOOD (ROUTINE X 2)  PROTIME-INR  LACTIC ACID, PLASMA  LACTIC ACID, PLASMA  I-STAT BETA HCG BLOOD, ED ([facility], [facility], [facility] ONLY)  TROPONIN I (HIGH SENSITIVITY)    EKG  ED ECG REPORT   Date: 04/18/2019 0028  Rate: 89  Rhythm: normal sinus rhythm  QRS Axis: normal  Intervals: QT prolonged  ST/T Wave abnormalities: nonspecific T wave changes  Conduction Disutrbances:none  Narrative Interpretation:  Old EKG Reviewed: unchanged  I have personally reviewed the EKG tracing and agree with the computerized printout as noted.  Radiology DG Chest 2 View  Result Date: 04/18/2019 CLINICAL DATA:  Chest pain EXAM: CHEST - 2 VIEW COMPARISON:  04/12/2019 FINDINGS: Persistent right lower lobe consolidation with small right pleural effusion unchanged. Left lung is clear. IMPRESSION: Unchanged right lower lobe consolidation and small right pleural effusion. Electronically Signed   By: Ulyses Jarred M.D.   On: 04/18/2019 00:35    Procedures Procedures   Medications Ordered in ED Medications  sodium chloride flush (NS) 0.9 % injection 3 mL (3 mLs Intravenous Not Given 04/18/19 0030)  vancomycin (VANCOCIN) IVPB 1000 mg/200 mL premix (has no administration in time range)  fentaNYL (SUBLIMAZE) injection 100 mcg (100 mcg Intravenous Given 04/18/19 0038)  albuterol (VENTOLIN HFA) 108 (90 Base) MCG/ACT inhaler 8 puff (8 puffs Inhalation Given 04/18/19 0200)  fentaNYL (SUBLIMAZE) injection 100 mcg (100 mcg Intravenous Given 04/18/19 0348)    ED Course  I have reviewed the triage vital signs and the nursing notes.  Pertinent labs & imaging results that were available during my care of the patient were reviewed by me and considered in my medical decision making (see chart for  details).    MDM Rules/Calculators/A&P                      1:25 AM Patient with recent admission was found to have pneumonia. SHe was COVID-19 negative She was also found to have bilateral pleural effusions and a small pericardial effusion 1:58 AM Patient reports pain is improved. However patient is tachypneic.  She does report increased dyspnea on exertion.  She reports she can barely walk around her house.  This all started just prior to her recent admission.  She denies any known lung disease, denies history of asthma and is no longer smoking. She has been taking her antibiotics as prescribed for pneumonia. Plan will be to give albuterol and reassess, but will likely need to be admitted. Her pain appears to be pleuritic in nature and may be related to underlying pleural effusions.  Patient had recent negative work-up for pulmonary embolism 3:48 AM Patient reports no improvement with albuterol. Patient did not have any hypoxia with ambulation but did have increasing pain as well as tachypnea. Concerned that she has failed outpatient management of her pneumonia. Patient will be admitted.  Discussed with Dr. Maudie Mercury for admission.  I will add on vancomycin to her antibiotic regimen. Final Clinical Impression(s) / ED Diagnoses Final diagnoses:  Recurrent pneumonia  ESRD (end stage renal disease) ([place])  Pleural effusion    Rx / DC Orders ED Discharge Orders    None       Ripley Fraise, MD 04/18/19 [phone]

## 2019-04-18 NOTE — Progress Notes (Signed)
Subjective:  recent admit 2/5-2/07 21 CP though 2/2  Pneumonia , dc Augmentin   Latasha Drake is an 39 y.o. 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 Francetta Found pt nephrology but doing CAPD  (last exchange  6pm ) No CCPD 2/2  Pain with fill and drain / had revision 03/2019 //Since then has been healing and doing CAPD with 4 exchange, 2L fill, 2.5% last admit  She had no PD done here 2/2 ok labs and emergent  HD  cases / No admitted with dyspnea and recurrent CP and PE had a D-dimer of 16 and intermediate probability of PE on VQ scan .  Objective Vital signs in last 24 hours: Vitals:   04/18/19 0915 04/18/19 0930 04/18/19 0945 04/18/19 1020  BP:    (!) 180/111  Pulse: (!) 104 (!) 106 (!) 108   Resp: (!) 29 (!) 29 (!) 23 (!) 22  Temp:    98.1 F (36.7 C)  TempSrc:    Oral  SpO2: 95% 95% 95% 99%   Weight change:   Physical Exam: Gen:  Alert young female in ER NAD  Neck: supple, no JVD Lungs: Bronchial BS  More on R lower base > L, un labored  CV:  Regular  tachy, no rub Abd: RLQ exit site c/d/i,  pd cath no dc  Extr:  Trace pedal edema Neuro: nonfocal Skin: warm and dry, no rashes   Problem/Plan:   1. ESRD - will do CAPD  2 exchanges today 2/2 to emergent cases . 2. Pleurtic Chest = wu per admit / D-dimer of 16 and intermediate probability of PE on VQ scan 3. Secondary hyperparathyroidism - phos 6.8  Phos binder needed  On po vit D 4. HTN/volume -  Use 2.5 % pd bags and home BP meds =labetalol 300 BID, procardia 60 BID,  hydralazine 25 TID 5. Anemia = HGB 8.3  Needs ESA  Per home unit  ,check records  6.   Ernest Haber, PA-C [facility] Beeper [phone] 04/18/2019,11:37 AM  LOS: 0 days   Labs: Basic Metabolic Panel: Recent Labs  Lab 04/12/19 1953 04/13/19 1143 04/14/19 0552 04/18/19 0010  NA 141  --  137 137  K 3.9  --  4.0 3.3*  CL 104  --  103 98  CO2 22  --  19* 22  GLUCOSE 98  --  94 109*  BUN 83*  --  89* 70*   CREATININE 8.08*  --  8.30* 9.14*  CALCIUM 8.6*  --  8.2* 8.0*  PHOS  --  5.4* 6.8*  --    Liver Function Tests: Recent Labs  Lab 04/12/19 1953 04/14/19 0552  AST 14*  --   ALT 9  --   ALKPHOS 82  --   BILITOT 0.6  --   PROT 7.1  --   ALBUMIN 3.5 2.7*   Recent Labs  Lab 04/12/19 1953  LIPASE 59*   No results for input(s): AMMONIA in the last 168 hours. CBC: Recent Labs  Lab 04/12/19 1953 04/13/19 1143 04/18/19 0010  WBC 14.7* 12.6* 9.4  HGB 9.2* 8.6* 8.3*  HCT 27.5* 26.9* 25.3*  MCV 69.6* 70.8* 69.5*  PLT 523* 479* 463*   Cardiac Enzymes: No results for input(s): CKTOTAL, CKMB, CKMBINDEX, TROPONINI in the last 168 hours. CBG: No results for input(s): GLUCAP in the last 168 hours.  Studies/Results: DG Chest 2 View  Result Date: 04/18/2019 CLINICAL DATA:  Chest pain EXAM: CHEST - 2 VIEW COMPARISON:  04/12/2019 FINDINGS: Persistent right lower lobe consolidation with small right pleural effusion unchanged. Left lung is clear. IMPRESSION: Unchanged right lower lobe consolidation and small right pleural effusion. Electronically Signed   By: Ulyses Jarred M.D.   On: 04/18/2019 00:35   Medications: . heparin 1,100 Units/hr (04/18/19 0651)   . furosemide  40 mg Oral Daily  . gentamicin cream  1 application Topical Daily  . hydrALAZINE  50 mg Oral BID  . labetalol  300 mg Oral BID  . NIFEdipine  60 mg Oral BID  . predniSONE  20 mg Oral Daily  . sodium chloride flush  3 mL Intravenous Once

## 2019-04-18 NOTE — Progress Notes (Signed)
Bilateral lower extremity venous duplex completed.  04/18/2019 4:21 PM Kelby Aline., MHA, RVT, RDCS, RDMS

## 2019-04-18 NOTE — ED Notes (Signed)
Lab to result d-dimer from previously collected labs

## 2019-04-18 NOTE — Progress Notes (Signed)
[place] for Heparin  Indication: pulmonary embolus  Allergies  Allergen Reactions  . Lisinopril Swelling    Vital Signs: Temp: 97.8 F (36.6 C) (02/11 0011) Temp Source: Oral (02/11 0011) BP: 157/99 (02/11 0415) Pulse Rate: 95 (02/11 0415)  Labs: Recent Labs    04/18/19 0010  HGB 8.3*  HCT 25.3*  PLT 463*  LABPROT 13.5  INR 1.0  CREATININE 9.14*  TROPONINIHS 16   Estimated Creatinine Clearance: 8.2 mL/min (A) (by C-G formula based on SCr of 9.14 mg/dL (H)).  Medical History: Past Medical History:  Diagnosis Date  . Anemia   . Chronic kidney disease   . Hypertension   . Lupus [facility])    Assessment: 39 y/o F with rib pain, D-dimer is elevated at 13. Pt was here last week and had a D-dimer of 16 and intermediate probability of PE on VQ scan and was not started on any anti-coagulation. Hgb 8.3, likely anemia of chronic disease. Plts good. ESRD on PD.  Goal of Therapy:  Heparin level 0.3-0.7 units/ml Monitor platelets by anticoagulation protocol: Yes   Plan:  Heparin 2000 units BOLUS, reduced bolus with anemia Start heparin drip at 1100 units/hr 1500 HL Daily CBC/HL Monitor for bleeding  Narda Bonds, PharmD, BCPS Clinical Pharmacist Phone: [phone]

## 2019-04-18 NOTE — H&P (Signed)
TRH H&P    Patient Demographics:    Latasha Drake, is a 39 y.o. female  MRN: [id number]  DOB - 10-21-1980  Admit Date - 04/17/2019  Referring MD/NP/PA:  Ripley Fraise  Outpatient Primary MD for the patient is Katherina Mires, MD  Patient coming from:  home  Chief complaint- dyspnea and chest pain   HPI:    Latasha Drake  is a 39 y.o. female,   39 y.o. female, w hypertension, lupus, anemia, small pericardial effusion, ESRD on peritoneal dialysis, w recent admissionfor right lower lung pneumonia, presents with c/o pleuritic chest pain.  Pt had VQ on prior admission intermediate risk but on the lower end.  Pt states that the chest pain is right and left sided, "sharp" worse with inspiration.  Pt has dyspnea, but pox at rest is 98% , but on walking per RN is 80's.   Pt denies fever, chills, cough w productive sputum, palp, n/v, abd pain, diarrhea, brbpr.   In ED T 97.8, P 91, R 18, Bp 131/89  Pox 98% on RA  CXR IMPRESSION: Unchanged right lower lobe consolidation and small right pleural effusion.  Na 137, K 3.3,  Bun 70, Creatinine 9.14 Wbc 9.4, Hgb 8.3, Plt 463  Trop 16  D dimer 13.31  Pt will be admitted for chest pain (pleuritic), as well as dyspnea r/o PE.     Review of systems:    In addition to the HPI above,  No Fever-chills, No Headache, No changes with Vision or hearing, No problems swallowing food or Liquids,   No Abdominal pain, No Nausea or Vomiting, bowel movements are regular, No Blood in stool or Urine, No dysuria, No new skin rashes or bruises, No new joints pains-aches,  No new weakness, tingling, numbness in any extremity, No recent weight gain or loss, No polyuria, polydypsia or polyphagia, No significant Mental Stressors.  All other systems reviewed and are negative.    Past History of the following :    Past Medical History:  Diagnosis Date  . Anemia   . Chronic  kidney disease   . Hypertension   . Lupus [facility])       Past Surgical History:  Procedure Laterality Date  . CESAREAN SECTION    . HERNIA REPAIR        Social History:      Social History   Tobacco Use  . Smoking status: Current Every Day Smoker    Packs/day: 0.50    Types: Cigarettes  . Smokeless tobacco: Never Used  Substance Use Topics  . Alcohol use: Yes    Comment: occassionally       Family History :     Family History  Problem Relation Age of Onset  . Lupus Other        cousin       Home Medications:   Prior to Admission medications   Medication Sig Start Date End Date Taking? Authorizing Provider  amoxicillin-clavulanate (AUGMENTIN) 875-125 MG tablet Take 1 tablet by mouth every 12 (twelve) hours. 04/14/19  Yes Osei-Bonsu, Iona Beard, MD  azithromycin (ZITHROMAX) 250 MG tablet Take 1 tablet (250 mg total) by mouth daily. 04/14/19  Yes Osei-Bonsu, Iona Beard, MD  cyclobenzaprine (FLEXERIL) 10 MG tablet Take 10 mg by mouth 3 (three) times daily as needed for muscle spasms.  04/03/19  Yes [provider]  furosemide (LASIX) 40 MG tablet Take 40 mg by mouth daily. 03/23/19  Yes [provider]  gentamicin cream (GARAMYCIN) 0.1 % Apply 1 application topically daily. 04/14/19  Yes Osei-Bonsu, Iona Beard, MD  hydrALAZINE (APRESOLINE) 50 MG tablet Take 50 mg by mouth 2 (two) times daily. 04/09/19  Yes [provider]  labetalol (NORMODYNE) 300 MG tablet Take 300 mg by mouth 2 (two) times daily.   Yes [provider]  NIFEdipine (PROCARDIA XL/NIFEDICAL XL) 60 MG 24 hr tablet Take 60 mg by mouth 2 (two) times daily. 03/23/19  Yes [provider]  ondansetron (ZOFRAN) 4 MG tablet Take 1 tablet (4 mg total) by mouth every 8 (eight) hours as needed for nausea or vomiting. 05/28/15  Yes Mackuen, Courteney Lyn, MD  pantoprazole (PROTONIX) 40 MG tablet Take 40 mg by mouth daily as needed (indigestion).  10/27/16  Yes [provider]  paragard  intrauterine copper IUD IUD 1 Intra Uterine Device by Intrauterine route once.   Yes [provider]  predniSONE (DELTASONE) 20 MG tablet Take 20 mg by mouth daily.  01/23/19  Yes [provider]  Vitamin D, Ergocalciferol, (DRISDOL) 50000 units CAPS capsule Take 50,000 Units by mouth every Sunday. Take on Sun. 06/23/15  Yes [provider]  Amino Acids-Protein Hydrolys (FEEDING SUPPLEMENT, PRO-STAT SUGAR FREE 64,) LIQD Take 30 mLs by mouth 2 (two) times daily. Patient not taking: Reported on 04/18/2019 04/14/19   Benito Mccreedy, MD     Allergies:     Allergies  Allergen Reactions  . Lisinopril Swelling     Physical Exam:   Vitals  Blood pressure (!) 159/91, pulse 85, temperature 97.8 F (36.6 C), temperature source Oral, resp. rate (!) 27, last menstrual period 02/24/2019, SpO2 97 %.  1.  General: axoxo3  2. Psychiatric: euthymic  3. Neurologic: nonfocal  4. HEENMT:  Anicteric, pupils 1.74mm symmetric, direct, consensual, near intact Neck: no jvd  5. Respiratory : Slight exp wheezing, no crackles.   6. Cardiovascular : rrr s1, s2,  7. Gastrointestinal:  Abd: soft, nt, nd, +bs  8. Skin:  Ext: no c/c/e, no rash  9.Musculoskeletal:  Good ROM     Data Review:    CBC Recent Labs  Lab 04/12/19 1953 04/13/19 1143 04/18/19 0010  WBC 14.7* 12.6* 9.4  HGB 9.2* 8.6* 8.3*  HCT 27.5* 26.9* 25.3*  PLT 523* 479* 463*  MCV 69.6* 70.8* 69.5*  MCH 23.3* 22.6* 22.8*  MCHC 33.5 32.0 32.8  RDW 17.2* 17.5* 17.1*   ------------------------------------------------------------------------------------------------------------------  Results for orders placed or performed during the hospital encounter of 04/17/19 (from the past 48 hour(s))  Basic metabolic panel     Status: Abnormal   Collection Time: 04/18/19 12:10 AM  Result Value Ref Range   Sodium 137 135 - 145 mmol/L   Potassium 3.3 (L) 3.5 - 5.1 mmol/L   Chloride 98 98 - 111 mmol/L    CO2 22 22 - 32 mmol/L   Glucose, Bld 109 (H) 70 - 99 mg/dL   BUN 70 (H) 6 - 20 mg/dL   Creatinine, Ser 9.14 (H) 0.44 - 1.00 mg/dL   Calcium 8.0 (L) 8.9 - 10.3 mg/dL  GFR calc non Af Amer 5 (L) >60 mL/min   GFR calc Af Amer 6 (L) >60 mL/min   Anion gap 17 (H) 5 - 15    Comment: Performed at [facility] [address] [place], [place] [address]  CBC     Status: Abnormal   Collection Time: 04/18/19 12:10 AM  Result Value Ref Range   WBC 9.4 4.0 - 10.5 K/uL   RBC 3.64 (L) 3.87 - 5.11 MIL/uL   Hemoglobin 8.3 (L) 12.0 - 15.0 g/dL    Comment: REPEATED TO VERIFY Reticulocyte Hemoglobin testing may be clinically indicated, consider ordering this additional test [id number]    HCT 25.3 (L) 36.0 - 46.0 %   MCV 69.5 (L) 80.0 - 100.0 fL    Comment: REPEATED TO VERIFY   MCH 22.8 (L) 26.0 - 34.0 pg   MCHC 32.8 30.0 - 36.0 g/dL   RDW 17.1 (H) 11.5 - 15.5 %   Platelets 463 (H) 150 - 400 K/uL    Comment: REPEATED TO VERIFY   nRBC 0.0 0.0 - 0.2 %    Comment: Performed at [facility], [place] [address] [place], [place] [address]  Troponin I (High Sensitivity)     Status: None   Collection Time: 04/18/19 12:10 AM  Result Value Ref Range   Troponin I (High Sensitivity) 16 <18 ng/L    Comment: Performed at [facility] [address], [place], [place] [address]  Protime-INR (order if Patient is taking Coumadin / Warfarin)     Status: None   Collection Time: 04/18/19 12:10 AM  Result Value Ref Range   Prothrombin Time 13.5 11.4 - 15.2 seconds   INR 1.0 0.8 - 1.2    Comment: (NOTE) INR goal varies based on device and disease states. Performed at [facility], [place] [address] [place], [place] [address]   I-Stat beta hCG blood, ED     Status: None   Collection Time: 04/18/19 12:34 AM  Result Value Ref Range   I-stat hCG, quantitative <5.0 <5 mIU/mL   Comment 3            Comment:   GEST. AGE      CONC.  (mIU/mL)   <=1 WEEK        5 - 50     2 WEEKS       50 - 500      3 WEEKS       100 - 10,000     4 WEEKS     1,000 - 30,000        FEMALE AND NON-PREGNANT FEMALE:     LESS THAN 5 mIU/mL     Chemistries  Recent Labs  Lab 04/12/19 1953 04/14/19 0552 04/18/19 0010  NA 141 137 137  K 3.9 4.0 3.3*  CL 104 103 98  CO2 22 19* 22  GLUCOSE 98 94 109*  BUN 83* 89* 70*  CREATININE 8.08* 8.30* 9.14*  CALCIUM 8.6* 8.2* 8.0*  AST 14*  --   --   ALT 9  --   --   ALKPHOS 82  --   --   BILITOT 0.6  --   --    ------------------------------------------------------------------------------------------------------------------  ------------------------------------------------------------------------------------------------------------------ GFR: Estimated Creatinine Clearance: 8.2 mL/min (A) (by C-G formula based on SCr of 9.14 mg/dL (H)). Liver Function Tests: Recent Labs  Lab 04/12/19 1953 04/14/19 0552  AST 14*  --   ALT 9  --  ALKPHOS 82  --   BILITOT 0.6  --   PROT 7.1  --   ALBUMIN 3.5 2.7*   Recent Labs  Lab 04/12/19 1953  LIPASE 59*   No results for input(s): AMMONIA in the last 168 hours. Coagulation Profile: Recent Labs  Lab 04/18/19 0010  INR 1.0   Cardiac Enzymes: No results for input(s): CKTOTAL, CKMB, CKMBINDEX, TROPONINI in the last 168 hours. BNP (last 3 results) No results for input(s): PROBNP in the last 8760 hours. HbA1C: No results for input(s): HGBA1C in the last 72 hours. CBG: No results for input(s): GLUCAP in the last 168 hours. Lipid Profile: No results for input(s): CHOL, HDL, LDLCALC, TRIG, CHOLHDL, LDLDIRECT in the last 72 hours. Thyroid Function Tests: No results for input(s): TSH, T4TOTAL, FREET4, T3FREE, THYROIDAB in the last 72 hours. Anemia Panel: No results for input(s): VITAMINB12, FOLATE, FERRITIN, TIBC, IRON, RETICCTPCT in the last 72 hours.  --------------------------------------------------------------------------------------------------------------- Urine analysis:    Component Value  Date/Time   COLORURINE YELLOW 10/03/2018 2332   APPEARANCEUR HAZY (A) 10/03/2018 2332   LABSPEC 1.014 10/03/2018 2332   PHURINE 5.0 10/03/2018 2332   GLUCOSEU NEGATIVE 10/03/2018 2332   HGBUR SMALL (A) 10/03/2018 2332   BILIRUBINUR NEGATIVE 10/03/2018 2332   KETONESUR NEGATIVE 10/03/2018 2332   PROTEINUR 100 (A) 10/03/2018 2332   NITRITE NEGATIVE 10/03/2018 2332   LEUKOCYTESUR NEGATIVE 10/03/2018 2332      Imaging Results:    DG Chest 2 View  Result Date: 04/18/2019 CLINICAL DATA:  Chest pain EXAM: CHEST - 2 VIEW COMPARISON:  04/12/2019 FINDINGS: Persistent right lower lobe consolidation with small right pleural effusion unchanged. Left lung is clear. IMPRESSION: Unchanged right lower lobe consolidation and small right pleural effusion. Electronically Signed   By: Ulyses Jarred M.D.   On: 04/18/2019 00:35      Assessment & Plan:    Principal Problem:   Chest pain Active Problems:   Lupus (HCC)   Hypertension   Symptomatic anemia   Pneumonia   ESRD (end stage renal disease) (HCC)   Persistent Pleuritic Chest pain  ? Pulmonary embolism Tele Check trop  Pt has completed her abx course,  And CXR unchanged Since D dimer elevated will start heparin GTT I would not CTA chest at this time since making urine, if symptoms improve with heparin, consider empiric treatment Dilaudid 0.5mg  iv q4h prn severe pain Consider repeat echo  Wheezing , Mild (no hx of asthma) Albuterol HFA 2puff q6h prn   Hypertension Cont Labetalol 300mg  po bid Cont Nifedipine 60mg  po bid Cont Hydralazine 50mg  po bid  ESRD on PD (pt still makes urine) Spoke with [place] to obtain nephrology consult for PD Check cmp in am  Anemia Check cbc in am  Gerd Cont PPI  H/o pneumonia Pt completed course of abx, CXR stable    DVT Prophylaxis-   Heparin - SCDs   AM Labs Ordered, also please review Full Orders  Family Communication: Admission, patients condition and plan of care including tests  being ordered have been discussed with the patient  who indicate understanding and agree with the plan and Code Status.  Code Status:  FULL CODE per patient  Admission status: Observation: Based on patients clinical presentation and evaluation of above clinical data, I have made determination that patient meets Observation criteria at this time.     Time spent in minutes : 55 minutes    Jani Gravel M.D on 04/18/2019 at 3:58 AM

## 2019-04-18 NOTE — Progress Notes (Signed)
Pharmacy Antibiotic Note  Latasha Drake is a 39 y.o. female admitted on 04/17/2019 with pneumonia.  Pharmacy has been consulted for Vancomycin dosing. ?Failed outpatient PO therapy. WBC is down. ESRD on PD.   Plan: Vancomycin 1500 mg IV x 1 Check vancomycin level in 72 hours Re-dose vancomycin when level is <20 F/U additional gram negative coverage  Temp (24hrs), Avg:97.8 F (36.6 C), Min:97.8 F (36.6 C), Max:97.8 F (36.6 C)  Recent Labs  Lab 04/12/19 1953 04/12/19 2116 04/12/19 2220 04/13/19 1143 04/14/19 0552 04/18/19 0010 04/18/19 0404  WBC 14.7*  --   --  12.6*  --  9.4  --   CREATININE 8.08*  --   --   --  8.30* 9.14*  --   LATICACIDVEN  --  1.1 0.8  --   --   --  1.0    Estimated Creatinine Clearance: 8.2 mL/min (A) (by C-G formula based on SCr of 9.14 mg/dL (H)).    Allergies  Allergen Reactions  . Lisinopril Swelling   Narda Bonds, PharmD, BCPS Clinical Pharmacist Phone: [phone]

## 2019-04-18 NOTE — ED Notes (Signed)
Tele PUI Breakfast ordered  

## 2019-04-18 NOTE — ED Notes (Signed)
Pt called out requesting pain medication. Pt offered tylenol and refused at this time.

## 2019-04-18 NOTE — Progress Notes (Signed)
PROGRESS NOTE  Brief Narrative: Latasha Drake is a 39 y.o. female with a history of SLE, ESRD on PD still making urine, recently chronic prednisone use, AOCD, and recent admission for RLL pneumonia discharged on augmentin 2/7 only to return to the ED 2/11 with dyspnea and pleuritic chest pain. At that admission, d-dimer was elevated and V/Q scan was intermediate probability for PE, though no anticoagulation started. On return today she had no significant change on CXR showing significant RLL consolidation and d-dimer 13.31. She was exertionally hypoxic and admitted earlier this morning on IV heparin empirically. Nephrology consulted for PD and pulmonology consulted for continued respiratory complaints and abnormal imaging.  Subjective: Having severe intermittent pain on both sides at the bottom of the chest wall/rib cage worse with any inspiration and most movements. Some shortness of breath mostly on exertion, unchanged from admission. Denies SSCP or chest pain that changes with recumbent vs. upright/leaning forward positioning. Has intermittent bilateral leg swelling which is currently about the same, no changes. Denies orthopnea. Had diarrhea with augmentin.   Objective: BP (!) 180/111 (BP Location: Right Arm)   Pulse (!) 108   Temp 98.1 F (36.7 C) (Oral)   Resp (!) 22   LMP 02/24/2019   SpO2 99%   Gen: Nontoxic Pulm: Nonlabored tachypnea with severely diminished RLL sounds.   CV: RRR, no murmur, no JVD, trace symmetric LE edema GI: Soft with mild protuberance, NT, ND, +BS Neuro: Alert and oriented. No focal deficits. Skin: No rashes, lesions or ulcers  Assessment & Plan: Acute hypoxemic respiratory failure With persistent consolidation in immunosuppressed patient. Covid negative. Chest pain, though troponin is negative.  - Pulmonology consulted, recommending inflammatory work up. Appreciate their assistance. - ?Consider augmenting steroids - Restart antibiotics. Vancomycin given in ED,  will transition to cefepime. Not sure of utility in checking PCT in patient on chronic prednisone. Blood cultures sent. Not coughing for sputum Cx.   Elevated d-dimer: With coexistant chest pain, PE remains in differential.  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  HTN:  - Continue hydralazine 50mg , increase to TID - Continue labetalol 300mg  BID - Continue nifedipine 60mg  BID  Patrecia Pour, MD Pager on amion 04/18/2019, 1:19 PM

## 2019-04-18 NOTE — ED Notes (Signed)
Pt ambulated to bathroom. Upon return to room noted increased work of breathing, labored, SpO2 95% RA, and RR 36 with expository wheezing

## 2019-04-18 NOTE — Progress Notes (Signed)
ANTICOAGULATION CONSULT NOTE - Follow Up Consult  Pharmacy Consult for Heparin  Indication: pulmonary embolus  Allergies  Allergen Reactions  . Lisinopril Swelling   Weight: 76.1 kg Height: 64 inches Heparin Dosing Weight: 70.7 kg  Vital Signs: Temp: 98.3 F (36.8 C) (02/11 1400) Temp Source: Oral (02/11 1400) BP: 166/103 (02/11 1516) Pulse Rate: 92 (02/11 1400)  Labs: Recent Labs    04/18/19 0010 04/18/19 1522  HGB 8.3*  --   HCT 25.3*  --   PLT 463*  --   LABPROT 13.5  --   INR 1.0  --   HEPARINUNFRC  --  0.30  CREATININE 9.14*  --   TROPONINIHS 16  --    Estimated Creatinine Clearance: 8.2 mL/min (A) (by C-G formula based on SCr of 9.14 mg/dL (H)).  Medical History: Past Medical History:  Diagnosis Date  . Anemia   . Chronic kidney disease   . Hypertension   . Lupus [facility])    Assessment: 39 yr old female with rib pain, D-dimer is elevated at 13. Pt was here last week and had a D-dimer of 16 and intermediate probability of PE on VQ scan and was not started on any anticoagulation. 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 Per med rec, pt was not on anticoagulation PTA.  Heparin level ~8.5 hrs after heparin 2000 units IV bolus, followed by heparin infusion at 1100 units/hr is 0.30 units/ml, which is at the low end of the goal range for this pt. H/H on admission were 8.3/25.3, platelets 463. Admission INR 1.0. Per RN, no issues with IV or bleeding observed.  Vascular U/S ordered for this afternoon; results not yet in Epic.  Goal of Therapy:  Heparin level 0.3-0.7 units/ml Monitor platelets by anticoagulation protocol: Yes   Plan:  Increase heparin infusion to 1150 units/hr Check 8-hr heparin level Monitor daily heparin level, CBC Monitor for signs/symptoms of bleeding  Gillermina Hu, PharmD, BCPS, [facility] Clinical Pharmacist 04/18/2019, 16:30 PM

## 2019-04-18 NOTE — ED Notes (Signed)
Patient ambulated to toilet, upon return to room patients shortness of breath worsened. patients breathing is labored and tachypnic. O2 saturations 86% on RA after ambulating to the bathroom.

## 2019-04-18 NOTE — ED Triage Notes (Signed)
Patient arrived with EMS from home reports bilateral ribcage pain onset yesterday morning , mild SOB , denies emesis or diaphoresis , no cough or fever , currently taking oral antibiotics for pneumonia .

## 2019-04-18 NOTE — Progress Notes (Signed)
Pharmacy Antibiotic Note  Latasha Drake is a 39 y.o. female admitted on 04/17/2019 with pneumonia.  Pharmacy has been consulted for Cefepime dosing.  ID: Abx for PNA - Afebrile, WBC WNL, ESRD on PD RLL pneumonia discharged on augmentin 2/7  Vanc 2/11>>2/11 Cefepime 2/11>>   Plan: Vancomycin 1500 mg IV x 1 on 2/11 (will hang around with PD) D/c Vancomycin Change to Cefepime 1g IV q24h     Temp (24hrs), Avg:98 F (36.7 C), Min:97.8 F (36.6 C), Max:98.1 F (36.7 C)  Recent Labs  Lab 04/12/19 1953 04/12/19 2116 04/12/19 2220 04/13/19 1143 04/14/19 0552 04/18/19 0010 04/18/19 0404 04/18/19 0543  WBC 14.7*  --   --  12.6*  --  9.4  --   --   CREATININE 8.08*  --   --   --  8.30* 9.14*  --   --   LATICACIDVEN  --  1.1 0.8  --   --   --  1.0 0.7    Estimated Creatinine Clearance: 8.2 mL/min (A) (by C-G formula based on SCr of 9.14 mg/dL (H)).    Allergies  Allergen Reactions  . Lisinopril Swelling    Der Gagliano S. Alford Highland, PharmD, BCPS Clinical Staff Pharmacist [email or web address] [place] 04/18/2019 1:52 PM

## 2019-04-19 ENCOUNTER — Inpatient Hospital Stay (HOSPITAL_COMMUNITY): Payer: Medicaid Other

## 2019-04-19 DIAGNOSIS — Z992 Dependence on renal dialysis: Secondary | ICD-10-CM | POA: Diagnosis not present

## 2019-04-19 DIAGNOSIS — D631 Anemia in chronic kidney disease: Secondary | ICD-10-CM | POA: Diagnosis present

## 2019-04-19 DIAGNOSIS — J9601 Acute respiratory failure with hypoxia: Secondary | ICD-10-CM | POA: Diagnosis present

## 2019-04-19 DIAGNOSIS — D638 Anemia in other chronic diseases classified elsewhere: Secondary | ICD-10-CM | POA: Diagnosis present

## 2019-04-19 DIAGNOSIS — Z79891 Long term (current) use of opiate analgesic: Secondary | ICD-10-CM | POA: Diagnosis not present

## 2019-04-19 DIAGNOSIS — R0781 Pleurodynia: Secondary | ICD-10-CM | POA: Diagnosis present

## 2019-04-19 DIAGNOSIS — Z975 Presence of (intrauterine) contraceptive device: Secondary | ICD-10-CM | POA: Diagnosis not present

## 2019-04-19 DIAGNOSIS — I313 Pericardial effusion (noninflammatory): Secondary | ICD-10-CM | POA: Diagnosis present

## 2019-04-19 DIAGNOSIS — N186 End stage renal disease: Secondary | ICD-10-CM | POA: Diagnosis present

## 2019-04-19 DIAGNOSIS — Z20822 Contact with and (suspected) exposure to covid-19: Secondary | ICD-10-CM | POA: Diagnosis present

## 2019-04-19 DIAGNOSIS — R091 Pleurisy: Secondary | ICD-10-CM | POA: Diagnosis not present

## 2019-04-19 DIAGNOSIS — R197 Diarrhea, unspecified: Secondary | ICD-10-CM | POA: Diagnosis not present

## 2019-04-19 DIAGNOSIS — K219 Gastro-esophageal reflux disease without esophagitis: Secondary | ICD-10-CM | POA: Diagnosis present

## 2019-04-19 DIAGNOSIS — Z7952 Long term (current) use of systemic steroids: Secondary | ICD-10-CM | POA: Diagnosis not present

## 2019-04-19 DIAGNOSIS — I10 Essential (primary) hypertension: Secondary | ICD-10-CM | POA: Diagnosis not present

## 2019-04-19 DIAGNOSIS — R071 Chest pain on breathing: Secondary | ICD-10-CM | POA: Diagnosis not present

## 2019-04-19 DIAGNOSIS — Z888 Allergy status to other drugs, medicaments and biological substances status: Secondary | ICD-10-CM | POA: Diagnosis not present

## 2019-04-19 DIAGNOSIS — T368X5A Adverse effect of other systemic antibiotics, initial encounter: Secondary | ICD-10-CM | POA: Diagnosis not present

## 2019-04-19 DIAGNOSIS — J90 Pleural effusion, not elsewhere classified: Secondary | ICD-10-CM | POA: Diagnosis present

## 2019-04-19 DIAGNOSIS — F17210 Nicotine dependence, cigarettes, uncomplicated: Secondary | ICD-10-CM | POA: Diagnosis present

## 2019-04-19 DIAGNOSIS — N2581 Secondary hyperparathyroidism of renal origin: Secondary | ICD-10-CM | POA: Diagnosis present

## 2019-04-19 DIAGNOSIS — I120 Hypertensive chronic kidney disease with stage 5 chronic kidney disease or end stage renal disease: Secondary | ICD-10-CM | POA: Diagnosis present

## 2019-04-19 DIAGNOSIS — E876 Hypokalemia: Secondary | ICD-10-CM | POA: Diagnosis not present

## 2019-04-19 DIAGNOSIS — D849 Immunodeficiency, unspecified: Secondary | ICD-10-CM | POA: Diagnosis present

## 2019-04-19 DIAGNOSIS — Z79899 Other long term (current) drug therapy: Secondary | ICD-10-CM | POA: Diagnosis not present

## 2019-04-19 DIAGNOSIS — J189 Pneumonia, unspecified organism: Secondary | ICD-10-CM | POA: Diagnosis not present

## 2019-04-19 DIAGNOSIS — M3214 Glomerular disease in systemic lupus erythematosus: Secondary | ICD-10-CM | POA: Diagnosis present

## 2019-04-19 DIAGNOSIS — M329 Systemic lupus erythematosus, unspecified: Secondary | ICD-10-CM | POA: Diagnosis not present

## 2019-04-19 HISTORY — PX: IR FLUORO GUIDE CV LINE RIGHT: IMG2283

## 2019-04-19 HISTORY — PX: IR US GUIDE VASC ACCESS RIGHT: IMG2390

## 2019-04-19 LAB — HEPARIN LEVEL (UNFRACTIONATED): Heparin Unfractionated: 0.59 IU/mL (ref 0.30–0.70)

## 2019-04-19 LAB — COMPREHENSIVE METABOLIC PANEL
ALT: 11 U/L (ref 0–44)
AST: 14 U/L — ABNORMAL LOW (ref 15–41)
Albumin: 2.9 g/dL — ABNORMAL LOW (ref 3.5–5.0)
Alkaline Phosphatase: 81 U/L (ref 38–126)
Anion gap: 18 — ABNORMAL HIGH (ref 5–15)
BUN: 70 mg/dL — ABNORMAL HIGH (ref 6–20)
CO2: 20 mmol/L — ABNORMAL LOW (ref 22–32)
Calcium: 8.5 mg/dL — ABNORMAL LOW (ref 8.9–10.3)
Chloride: 97 mmol/L — ABNORMAL LOW (ref 98–111)
Creatinine, Ser: 9.23 mg/dL — ABNORMAL HIGH (ref 0.44–1.00)
GFR calc Af Amer: 6 mL/min — ABNORMAL LOW (ref >60)
GFR calc non Af Amer: 5 mL/min — ABNORMAL LOW (ref >60)
Glucose, Bld: 145 mg/dL — ABNORMAL HIGH (ref 70–99)
Potassium: 3.7 mmol/L (ref 3.5–5.1)
Sodium: 135 mmol/L (ref 135–145)
Total Bilirubin: 0.4 mg/dL (ref 0.3–1.2)
Total Protein: 6.8 g/dL (ref 6.5–8.1)

## 2019-04-19 LAB — FANA STAINING PATTERNS: Homogeneous Pattern: 1:80 {titer}

## 2019-04-19 LAB — CBC
HCT: 25.6 % — ABNORMAL LOW (ref 36.0–46.0)
Hemoglobin: 8.3 g/dL — ABNORMAL LOW (ref 12.0–15.0)
MCH: 22.7 pg — ABNORMAL LOW (ref 26.0–34.0)
MCHC: 32.4 g/dL (ref 30.0–36.0)
MCV: 69.9 fL — ABNORMAL LOW (ref 80.0–100.0)
Platelets: 454 10*3/uL — ABNORMAL HIGH (ref 150–400)
RBC: 3.66 MIL/uL — ABNORMAL LOW (ref 3.87–5.11)
RDW: 17.1 % — ABNORMAL HIGH (ref 11.5–15.5)
WBC: 8.5 10*3/uL (ref 4.0–10.5)
nRBC: 0 % (ref 0.0–0.2)

## 2019-04-19 LAB — PROCALCITONIN: Procalcitonin: 0.27 ng/mL

## 2019-04-19 LAB — HEPATITIS B SURFACE ANTIGEN: Hepatitis B Surface Ag: NONREACTIVE

## 2019-04-19 LAB — DIRECT ANTIGLOBULIN TEST (NOT AT ARMC)
DAT, IgG: NEGATIVE
DAT, complement: NEGATIVE

## 2019-04-19 LAB — ECHOCARDIOGRAM COMPLETE: Weight: 2675.2 oz

## 2019-04-19 LAB — ANTI-DNA ANTIBODY, DOUBLE-STRANDED: ds DNA Ab: >300 IU/mL — ABNORMAL HIGH (ref 0–9)

## 2019-04-19 LAB — HEPATITIS B CORE ANTIBODY, TOTAL: Hep B Core Total Ab: NONREACTIVE

## 2019-04-19 LAB — C3 COMPLEMENT: C3 Complement: 103 mg/dL (ref 82–167)

## 2019-04-19 LAB — ANTINUCLEAR ANTIBODIES, IFA: ANA Ab, IFA: POSITIVE — AB

## 2019-04-19 LAB — C4 COMPLEMENT: Complement C4, Body Fluid: 21 mg/dL (ref 12–38)

## 2019-04-19 MED ORDER — LIDOCAINE HCL 1 % IJ SOLN
INTRAMUSCULAR | Status: AC | PRN
  Administered 2019-04-19: 10 mL

## 2019-04-19 MED ORDER — CHLORHEXIDINE GLUCONATE CLOTH 2 % EX PADS: 6.0000 | MEDICATED_PAD | Freq: Every day | CUTANEOUS | Status: DC

## 2019-04-19 MED ORDER — HEPARIN SODIUM (PORCINE) 1000 UNIT/ML IJ SOLN
INTRAMUSCULAR | Status: AC
  Filled 2019-04-19: qty 1

## 2019-04-19 MED ORDER — HEPARIN SODIUM (PORCINE) 1000 UNIT/ML IJ SOLN
INTRAMUSCULAR | Status: AC
  Administered 2019-04-19: 2600 [IU]
  Filled 2019-04-19: qty 3

## 2019-04-19 MED ORDER — LIDOCAINE HCL 1 % IJ SOLN
INTRAMUSCULAR | Status: AC
  Filled 2019-04-19: qty 20

## 2019-04-19 MED ORDER — DARBEPOETIN ALFA 40 MCG/0.4ML IJ SOSY: 40.0000 ug | PREFILLED_SYRINGE | INTRAMUSCULAR | Status: DC

## 2019-04-19 MED ORDER — CHLORHEXIDINE GLUCONATE CLOTH 2 % EX PADS
6.0000 | MEDICATED_PAD | Freq: Every day | CUTANEOUS | Status: DC
  Administered 2019-04-19 – 2019-04-25 (×7): 6 via TOPICAL

## 2019-04-19 MED ORDER — HYDRALAZINE HCL 50 MG PO TABS
100.0000 mg | ORAL_TABLET | Freq: Three times a day (TID) | ORAL | Status: DC
  Administered 2019-04-19 – 2019-04-26 (×21): 100 mg via ORAL
  Filled 2019-04-19 (×23): qty 2

## 2019-04-19 NOTE — Progress Notes (Signed)
  Echocardiogram 2D Echocardiogram has been performed.  Latasha Drake 04/19/2019, 9:20 AM

## 2019-04-19 NOTE — Progress Notes (Addendum)
Latasha Drake Progress Note   Subjective:  Seen in room. Increased WOB, SOB today. Agreed to trial of HD to help with volume removal. IR to place temp cath today.   Objective Vitals:   04/19/19 0546 04/19/19 0744 04/19/19 0925 04/19/19 1152  BP: (!) 165/106   (!) 179/115  Pulse: (!) 103 (!) 105  86  Resp: 20 20  20   Temp: 98 F (36.7 C)   98.1 F (36.7 C)  TempSrc: Oral   Oral  SpO2: 97% 92%  98%  Weight: 75.8 kg     Height:   5\' 4"  (1.626 m)     Weight change:    Additional Objective Labs: Basic Metabolic Panel: Recent Labs  Lab 04/12/19 1953 04/13/19 1143 04/14/19 0552 04/18/19 0010 04/19/19 0123  NA   < >  --  137 137 135  K   < >  --  4.0 3.3* 3.7  CL   < >  --  103 98 97*  CO2   < >  --  19* 22 20*  GLUCOSE   < >  --  94 109* 145*  BUN   < >  --  89* 70* 70*  CREATININE   < >  --  8.30* 9.14* 9.23*  CALCIUM   < >  --  8.2* 8.0* 8.5*  PHOS  --  5.4* 6.8*  --   --    < > = values in this interval not displayed.   CBC: Recent Labs  Lab 04/12/19 1953 04/12/19 1953 04/13/19 1143 04/18/19 0010 04/19/19 0123  WBC 14.7*   < > 12.6* 9.4 8.5  HGB 9.2*   < > 8.6* 8.3* 8.3*  HCT 27.5*   < > 26.9* 25.3* 25.6*  MCV 69.6*  --  70.8* 69.5* 69.9*  PLT 523*   < > 479* 463* 454*   < > = values in this interval not displayed.   Blood Culture    Component Value Date/Time   SDES BLOOD SITE NOT SPECIFIED 04/18/2019 0540   SPECREQUEST  04/18/2019 0540    BOTTLES DRAWN AEROBIC AND ANAEROBIC Blood Culture results may not be optimal due to an inadequate volume of blood received in culture bottles   CULT  04/18/2019 0540    NO GROWTH 1 DAY Performed at [facility], [place] [address] [place], [place] [address]    REPTSTATUS PENDING 04/18/2019 0540     Physical Exam General: Young female, sitting up in bed, increased WOB Heart: Tachy, regular  Lungs: Decreased in R base; Wheezing upper bilaterally  Abdomen: soft; non-tender  Extremities: No sig  LE edema  Dialysis Access: PD cath in place  Medications: . ceFEPime (MAXIPIME) IV 200 mL/hr at 04/18/19 1541  . dialysis solution 2.5% low-MG    . heparin 1,150 Units/hr (04/19/19 0307)   . Chlorhexidine Gluconate Cloth  6 each Topical Daily  . furosemide  40 mg Intravenous BID  . gentamicin cream  1 application Topical Daily  . dianeal solution for CAPD/CCPD with heparin   Peritoneal Dialysis Once  . hydrALAZINE  100 mg Oral TID  . ipratropium-albuterol  3 mL Nebulization BID  . labetalol  300 mg Oral BID  . methylPREDNISolone (SOLU-MEDROL) injection  40 mg Intravenous BID  . NIFEdipine  60 mg Oral BID  . sodium chloride flush  3 mL Intravenous Once    Dialysis Orders: CAPD Followed by Dr. Neta Ehlers @ [facility]  Assessment/Plan: 1. Dyspnea/Pleurtic CP. Recent treatment  for PNA. Repeat CXR 2/11 showing unchanged RLL consolidation small pleural effusion. Intermediate probability for PE per nuclear scan. On empiric heparin, antibiotics per primary. Will plan HD today and see if improvement with volume removal.  2. ESRD - Usually PD. >>Plan for HD today after placement of temp cath. Orders written for 3L UF.   3. HTN/volume- BP elevated. On labetalol, Procardia, Hydralazine. Follow BP after HD.  4. Anemia-  Check OP records for ESA dosing. Fe studies pending  5. MBD of CKD -  Continue home binders/Vit D  6. Nutrition - Renal diet/fluid restrictions   Latasha Child PA-C [facility] Pager [phone] 04/19/2019,12:15 PM  LOS: 0 days   Pt seen, examined and agree w A/P as above.  Latasha Splinter  MD 04/19/2019, 2:02 PM

## 2019-04-19 NOTE — Progress Notes (Signed)
NAME:  Latasha Drake, MRN:  [id number], DOB:  Jun 19, 1980, LOS: 0 ADMISSION DATE:  04/17/2019, CONSULTATION DATE:  04/18/19 REFERRING MD:  Jeneen Rinks  CHIEF COMPLAINT:  Dyspnea   Brief History   Latasha Drake is a 39 y.o. female who was admitted 2/11 with dyspnea and pleuritic chest pain.    History of present illness   Latasha Drake is a 39 y.o. female who has a PMH including but not limited to HTN, ESRD on home PD, SLE, chronic anemia (see "past medical history" for rest).  She presented to [facility] ED 04/17/19 with dyspnea and pleuritic chest pain both left and right sides and radiating sub sternum into lower back.  Pain described as sharp and worse with inspiration.  She reported dyspnea in ED, SpO2 was 98% at rest; however, dipped to 80's with ambulation.  No recent fevers/chills/sweats, cough, N/V/D, abd pain, myalgias, exposures to known sick contacts. In ED, CXR showed unchanged RLL consolidation with small right effusion.  D-dimer elevated at 13.31, trop 16.  She was admitted by [facility] and was started on empiric heparin.  PCCM was asked to see in AM 2/11 for further recs on effusion.  She had admission to [facility] 04/12/19 through 04/14/19 for RLL PNA.  She had VQ during that admit which was intermediate risk for PE (low end).  Past Medical History  has Acute on chronic blood loss anemia; Lupus ([place]); Hypertension; Chronic kidney disease; Acute renal failure superimposed on chronic kidney disease ([place]); Elevated d-dimer; Pericardial effusion; Traumatic perinephric hematoma of left kidney; Symptomatic anemia; Pneumonia; Chest pain; ESRD (end stage renal disease) ([place]); and Acute respiratory failure with hypoxia ([place]) on their problem list.  Significant Hospital Events   2/11 > admit.  Consults:  PCCM, nephrology.  Procedures:  None.  Significant Diagnostic Tests:  CXR 2/10 > unchanged RLL consolidation with small effusion.  Micro Data:  SARS 2/11 > neg. BCX 2/11>   Antimicrobials:  Cefepime 2/11 >    Interim history/subjective:  Continues to have shortness of breath with wheezing. Pleuritic pain somewhat improved after starting steroids  Objective:  Blood pressure (!) 165/106, pulse (!) 105, temperature 98 F (36.7 C), temperature source Oral, resp. rate 20, weight 75.8 kg, last menstrual period 02/24/2019, SpO2 92 %.        Intake/Output Summary (Last 24 hours) at 04/19/2019 [id number] Last data filed at 04/19/2019 0330 Gross per 24 hour  Intake 4976.63 ml  Output 3950 ml  Net 1026.63 ml   Filed Weights   04/19/19 0546  Weight: 75.8 kg   Physical Exam: General: Well-appearing young female laying in bed, no acute distress HENT: [place], AT, OP clear, MMM Eyes: EOMI, no scleral icterus Respiratory: Bilateral squeaks and wheezing.  Cardiovascular: RRR, -M/R/G, no JVD Extremities:-Edema,-tenderness Neuro: AAO x4, CNII-XII grossly intact Psych: Normal mood, normal affect   Assessment & Plan:   Acute hypoxic respiratory failure - presumed primarily 2/2 splinting / alveolar hypoventilation from pleuritic chest pain.  Agree possible PE given elevated D-dimer.  CXR with mild edema and pleural effusion +/- atelectasis.  Lastly, can't rule out ongoing PNA. LE Dopplers negative - Continue supplemental O2 as needed to maintain SpO2 > 92%. - Agree with empiric anticoagulation - Continue 40mg  lasix BID. Need strict I&Os. Goal net negative daily - Solumedrol in lieu of home prednisone. - Albuterol PRN. - Continue empiric cefepime. - If no improvement despite above measures then can review chest with ultrasound to assess if there is an appreciable effusion with safe  pocket to tap.  Pleuritic chest pain - unclear etiology but in light of underlying SLE hx, there is some concern for pleural inflammation.  - Empiric heparin as above. - ANA, dsDNA, C3, C4 pending - Consider empiric naproxen 250mg  q12hrs (held for now due to renal failure). - Solumedrol in lieu of home prednisone as above. -  Await echo to rule out pericarditis, pleural effusion  Pulmonary will continue to follow  Best Practice:  Diet: Renal. Pain/Anxiety/Delirium protocol (if indicated): N/A. VAP protocol (if indicated): N/A. DVT prophylaxis: Heparin gtt. GI prophylaxis: N/A. Glucose control: N/A. Mobility: Up with assist. Code Status: Full. Family Communication: Per primary. Disposition: Tele.  Labs   CBC: Recent Labs  Lab 04/12/19 1953 04/13/19 1143 04/18/19 0010 04/19/19 0123  WBC 14.7* 12.6* 9.4 8.5  HGB 9.2* 8.6* 8.3* 8.3*  HCT 27.5* 26.9* 25.3* 25.6*  MCV 69.6* 70.8* 69.5* 69.9*  PLT 523* 479* 463* [id number]*   Basic Metabolic Panel: Recent Labs  Lab 04/12/19 1953 04/13/19 1143 04/14/19 0552 04/18/19 0010 04/19/19 0123  NA 141  --  137 137 135  K 3.9  --  4.0 3.3* 3.7  CL 104  --  103 98 97*  CO2 22  --  19* 22 20*  GLUCOSE 98  --  94 109* 145*  BUN 83*  --  89* 70* 70*  CREATININE 8.08*  --  8.30* 9.14* 9.23*  CALCIUM 8.6*  --  8.2* 8.0* 8.5*  PHOS  --  5.4* 6.8*  --   --    GFR: Estimated Creatinine Clearance: 8.1 mL/min (A) (by C-G formula based on SCr of 9.23 mg/dL (H)). Recent Labs  Lab 04/12/19 1953 04/12/19 2116 04/12/19 2220 04/13/19 1143 04/18/19 0010 04/18/19 0404 04/18/19 0543 04/19/19 0123  PROCALCITON  --   --   --   --   --   --   --  0.27  WBC 14.7*  --   --  12.6* 9.4  --   --  8.5  LATICACIDVEN  --  1.1 0.8  --   --  1.0 0.7  --    Liver Function Tests: Recent Labs  Lab 04/12/19 1953 04/14/19 0552 04/19/19 0123  AST 14*  --  14*  ALT 9  --  11  ALKPHOS 82  --  81  BILITOT 0.6  --  0.4  PROT 7.1  --  6.8  ALBUMIN 3.5 2.7* 2.9*   Recent Labs  Lab 04/12/19 1953  LIPASE 59*   No results for input(s): AMMONIA in the last 168 hours. ABG No results found for: PHART, PCO2ART, PO2ART, HCO3, TCO2, ACIDBASEDEF, O2SAT  Coagulation Profile: Recent Labs  Lab 04/18/19 0010  INR 1.0

## 2019-04-19 NOTE — Progress Notes (Signed)
ANTICOAGULATION CONSULT NOTE - Follow Up Consult  Pharmacy Consult for Heparin Indication: pulmonary embolus  Allergies  Allergen Reactions  . Lisinopril Swelling    Patient Measurements: Weight: 167 lb 3.2 oz (75.8 kg) Heparin Dosing Weight:    Vital Signs: Temp: 98 F (36.7 C) (02/12 0546) Temp Source: Oral (02/12 0546) BP: 165/106 (02/12 0546) Pulse Rate: 105 (02/12 0744)  Labs: Recent Labs    04/18/19 0010 04/18/19 1522 04/19/19 0123  HGB 8.3*  --  8.3*  HCT 25.3*  --  25.6*  PLT 463*  --  454*  LABPROT 13.5  --   --   INR 1.0  --   --   HEPARINUNFRC  --  0.30 0.59  CREATININE 9.14*  --  9.23*  TROPONINIHS 16  --   --     Estimated Creatinine Clearance: 8.1 mL/min (A) (by C-G formula based on SCr of 9.23 mg/dL (H)).   Assessment:  Anticoag: Heparin for PE, Dopplers negative for DVT- Hgb 8.3, plts 454, no AC PTA. Ddimer 13.31. HL 0.59 in goal.  - 2/5 CT with pleural effusions and consolidation only - VQ scan 2/6: indeterminate.  Goal of Therapy:  Heparin level 0.3-0.7 units/ml Monitor platelets by anticoagulation protocol: Yes   Plan:  Con't IV heparin at 1150 units/hr Daily HL and CBC   Kylee Nardozzi S. Alford Highland, PharmD, BCPS Clinical Staff Pharmacist [email or web address] Alford Highland, Danner Paulding Stillinger 04/19/2019,9:06 AM

## 2019-04-19 NOTE — TOC Initial Note (Signed)
Transition of Care [facility]) - Initial/Assessment Note    Patient Details  Name: Latasha Drake MRN: [id number] Date of Birth: 05/06/1980  [facility]) CM/SW Contact:    Marilu Favre, RN Phone Number: 04/19/2019, 2:20 PM  Clinical Narrative:                 Spoke to patient and boyfriend at bedside. Discussed PT recommendation for home health PT. Patient from home with boyfriend and 41 year old daughter.  NCM called every home health agency on Medicare.gov list. At this time unable to find accepting agency. Patient and boyfriend voiced understanding.   Offered OP PT , but at this time patient declined. No DME needed.  Expected Discharge Plan: Home/Self Care Barriers to Discharge: Continued Medical Work up   Patient Goals and CMS Choice Patient states their goals for this hospitalization and ongoing recovery are:: to return to home CMS Medicare.gov Compare Post Acute Care list provided to:: Patient Choice offered to / list presented to : Patient  Expected Discharge Plan and Services Expected Discharge Plan: Home/Self Care   Discharge Planning Services: CM Consult   Living arrangements for the past 2 months: Apartment                 DME Arranged: N/A                    Prior Living Arrangements/Services Living arrangements for the past 2 months: Apartment Lives with:: Significant Other Patient language and need for interpreter reviewed:: Yes Do you feel safe going back to the place where you live?: Yes      Need for Family Participation in Patient Care: Yes (Comment) Care giver support system in place?: Yes (comment)   Criminal Activity/Legal Involvement Pertinent to Current Situation/Hospitalization: No - Comment as needed  Activities of Daily Living Home Assistive Devices/Equipment: None ADL Screening (condition at time of admission) Patient's cognitive ability adequate to safely complete daily activities?: Yes Is the patient deaf or have  difficulty hearing?: No Does the patient have difficulty seeing, even when wearing glasses/contacts?: No Does the patient have difficulty concentrating, remembering, or making decisions?: No Patient able to express need for assistance with ADLs?: Yes Does the patient have difficulty dressing or bathing?: No Independently performs ADLs?: Yes (appropriate for developmental age) Does the patient have difficulty walking or climbing stairs?: No Weakness of Legs: Both Weakness of Arms/Hands: Both  Permission Sought/Granted   Permission granted to share information with : No              Emotional Assessment Appearance:: Appears stated age Attitude/Demeanor/Rapport: Engaged Affect (typically observed): Accepting Orientation: : Oriented to Self, Oriented to Place, Oriented to  Time, Oriented to Situation Alcohol / Substance Use: Not Applicable    Admission diagnosis:  Pleural effusion [J90] ESRD (end stage renal disease) ([place]) [N18.6] Recurrent pneumonia [J18.9] Chest pain [R07.9] Acute respiratory failure with hypoxia ([place]) [J96.01] Patient Active Problem List   Diagnosis Date Noted  . Acute respiratory failure with hypoxia ([place]) 04/19/2019  . Chest pain 04/13/2019  . ESRD (end stage renal disease) ([place]) 04/13/2019  . Pneumonia 04/12/2019  . Acute on chronic blood loss anemia 10/04/2018  . Symptomatic anemia 10/04/2018  . Lupus ([place])   . Hypertension   . Chronic kidney disease   . Acute renal failure superimposed on chronic kidney disease ([place])   . Elevated d-dimer   . Pericardial effusion   . Traumatic perinephric hematoma of left kidney  PCP:  Katherina Mires, MD Pharmacy:   [id number] [place], [place] - [place] [address] [place] [address] Phone: [phone] Fax: [phone]  [id number] [address] [place] [address] Phone: [phone] Fax:  [phone]     Social Determinants of Health (SDOH) Interventions    Readmission Risk Interventions No flowsheet data found.

## 2019-04-19 NOTE — Procedures (Signed)
Interventional Radiology Procedure:   Indications: ESRD and needs hemodialysis  Procedure: Non-tunneled dialysis catheter placement  Findings: Right jugular catheter, tip at SVC/RA junction  Complications: None     EBL: less than 10 ml  Plan: Catheter is ready to use.     Ameka Krigbaum R. Anselm Pancoast, MD  Pager: [phone]

## 2019-04-19 NOTE — Evaluation (Signed)
Physical Therapy Evaluation Patient Details Name: Latasha Drake MRN: [id number] DOB: November 25, 1980 Today's Date: 04/19/2019   History of Present Illness  Pt adm with chest pain and acute hypoxic respiratory failure. Multifactorial with unclear etiology PNA vs lupus flare vs PE. Pt with heparin initiated 2/11 AM and therapeutic.  Pt to have HD catheter placed 2/12 with HD to follow. Pt with recent admission due to PNA. PMH - lupus, HTN, ESRD on PD.   Clinical Impression  Pt presents to PT with poor activity tolerance at home preventing her from being able to perform the activities she needs to and wants to perform at home. Pt with a 2 month decline in functional activity tolerance due to multiple recent medical problems and deconditioning. Can benefit from skilled PT to incr functional activity tolerance.     Follow Up Recommendations Home health PT    Equipment Recommendations  None recommended by PT    Recommendations for Other Services OT consult     Precautions / Restrictions Precautions Precautions: None Restrictions Weight Bearing Restrictions: No      Mobility  Bed Mobility Overal bed mobility: Independent                Transfers Overall transfer level: Modified independent Equipment used: None             General transfer comment: Incr time  Ambulation/Gait Ambulation/Gait assistance: Supervision Gait Distance (Feet): 100 Feet Assistive device: None Gait Pattern/deviations: Step-through pattern;Decreased stride length Gait velocity: decr Gait velocity interpretation: 1.31 - 2.62 ft/sec, indicative of limited community ambulator General Gait Details: supervision for lines. Pt fatigues and dyspnea 3/4. SpO2 93% on RA.   Stairs            Wheelchair Mobility    Modified Rankin (Stroke Patients Only)       Balance Overall balance assessment: No apparent balance deficits (not formally assessed)                                            Pertinent Vitals/Pain      Home Living Family/patient expects to be discharged to:: Private residence Living Arrangements: Children Available Help at Discharge: Family;Available PRN/intermittently Type of Home: Apartment Home Access: Stairs to enter   Entrance Stairs-Number of Steps: 1 Home Layout: One level Home Equipment: None      Prior Function Level of Independence: Needs assistance   Gait / Transfers Assistance Needed: independent but limited distance/activity tolerance  ADL's / Homemaking Assistance Needed: Assist for homemaking  Comments: Pt reports she has had a decline in ability to perform household activities due to weakness and SOB     Hand Dominance        Extremity/Trunk Assessment   Upper Extremity Assessment Upper Extremity Assessment: Defer to OT evaluation    Lower Extremity Assessment Lower Extremity Assessment: Generalized weakness       Communication   Communication: No difficulties  Cognition Arousal/Alertness: Awake/alert Behavior During Therapy: WFL for tasks assessed/performed Overall Cognitive Status: Within Functional Limits for tasks assessed                                        General Comments      Exercises     Assessment/Plan    PT Assessment Patient needs continued  PT services  PT Problem List Decreased strength;Decreased activity tolerance;Cardiopulmonary status limiting activity       PT Treatment Interventions Gait training;Functional mobility training;Therapeutic activities;Therapeutic exercise;Patient/family education    PT Goals (Current goals can be found in the Care Plan section)  Acute Rehab PT Goals Patient Stated Goal: Be able to perform household activities that she needs and wants to do PT Goal Formulation: With patient Time For Goal Achievement: 04/26/19 Potential to Achieve Goals: Good    Frequency Min 3X/week   Barriers to discharge        Co-evaluation                AM-PAC PT "6 Clicks" Mobility  Outcome Measure Help needed turning from your back to your side while in a flat bed without using bedrails?: None Help needed moving from lying on your back to sitting on the side of a flat bed without using bedrails?: None Help needed moving to and from a bed to a chair (including a wheelchair)?: None Help needed standing up from a chair using your arms (e.g., wheelchair or bedside chair)?: None Help needed to walk in hospital room?: None Help needed climbing 3-5 steps with a railing? : A Little 6 Click Score: 23    End of Session   Activity Tolerance: Patient limited by fatigue Patient left: in bed;with call bell/phone within reach;with nursing/sitter in room Nurse Communication: Mobility status PT Visit Diagnosis: Muscle weakness (generalized) (M62.81);Other abnormalities of gait and mobility (R26.89)    Time: [id number] PT Time Calculation (min) (ACUTE ONLY): 11 min   Charges:   PT Evaluation $PT Eval Low Complexity: 1 Low          [place] Pager [phone] Office [place] 04/19/2019, 12:21 PM

## 2019-04-19 NOTE — Progress Notes (Signed)
[place] for Heparin  Indication: pulmonary embolus  Allergies  Allergen Reactions  . Lisinopril Swelling   Weight: 76.1 kg Height: 64 inches Heparin Dosing Weight: 70.7 kg  Vital Signs: Temp: 98.1 F (36.7 C) (02/12 0005) Temp Source: Oral (02/12 0005) BP: 162/107 (02/12 0005) Pulse Rate: 93 (02/12 0005)  Labs: Recent Labs    04/18/19 0010 04/18/19 1522 04/19/19 0123  HGB 8.3*  --  8.3*  HCT 25.3*  --  25.6*  PLT 463*  --  454*  LABPROT 13.5  --   --   INR 1.0  --   --   HEPARINUNFRC  --  0.30 0.59  CREATININE 9.14*  --   --   TROPONINIHS 16  --   --    Estimated Creatinine Clearance: 8.2 mL/min (A) (by C-G formula based on SCr of 9.14 mg/dL (H)).  Assessment: 39 y.o. female with possible PE for heparin  Goal of Therapy:  Heparin level 0.3-0.7 units/ml Monitor platelets by anticoagulation protocol: Yes   Plan:  Continue Heparin at current rate   Phillis Knack, PharmD, BCPS

## 2019-04-19 NOTE — Progress Notes (Addendum)
PROGRESS NOTE  Deneka Greenwalt  NOM:[id number] DOB: 1981/01/24 DOA: 04/17/2019 PCP: Katherina Mires, MD   Brief Narrative: Timiko Offutt is a 39 y.o. female with a history of SLE, ESRD on PD still making urine, recently chronic prednisone use, AOCD, and recent admission for RLL pneumonia discharged on augmentin 2/7 only to return to the ED 2/11 with dyspnea and pleuritic chest pain. At that admission, d-dimer was elevated and V/Q scan was intermediate probability for PE, though no anticoagulation started. On return today she had no significant change on CXR showing significant RLL consolidation and d-dimer 13.31. She was exertionally hypoxic and admitted earlier this morning on IV heparin empirically. Nephrology consulted for PD and pulmonology consulted for continued respiratory complaints and abnormal imaging. IV steroids have been started, antibiotics restarted, and further work up is pending. The patient remains severely short of breath with any exertion.  Assessment & Plan: Principal Problem:   Chest pain Active Problems:   Lupus (HCC)   Hypertension   Symptomatic anemia   Pneumonia   ESRD (end stage renal disease) (HCC)   Acute respiratory failure with hypoxia (HCC)  Acute hypoxemic respiratory failure: Multifactorial with unclear primary etiology. Suspect some bacterial pneumonia with persistent consolidation in immunosuppressed patient and elevated PCT. Covid negative. Chest pain, though troponin is negative. Lupus flare also possible.   - Continue supplemental oxygen to maintain SpO2 >90%, currently exertionally hypoxemic. - Inflammatory work up pending, though ESR 55, awaiting serology and complement. Continue IV steroids, may require further augmentation.  - Continue empiric cefepime.  - Pulmonology consulted. Appreciate their assistance. ?if U/S, consideration of thoracentesis would be of benefit. - Bronchodilators seem to have not helped.   Elevated d-dimer: With coexistant chest  pain, PE remains in differential. LE venous U/S without evidence of DVT.  - Continue IV heparin - Echo ordered - Consider repeat V/Q scan   SLE, history of lupus nephritis and ESRD on PD:  - Nephrology consulted, will oversee dialysis  - Continue prednisone '20mg'$  - Would prefer to avoid NSAIDs in this renal patient if possible.  HTN: Uncontrolled, argues against tamponade.  - Will further increase hydralazine '50mg'$  > '100mg'$  TID - Continue labetalol '300mg'$  BID - Continue nifedipine '60mg'$  BID  Anemia of chronic disease: Possibly contributing to symptoms. Denies bleeding, has required transfusions in the past.  - Monitor daily. - Check iron studies, has reported hx chronic blood loss anemia.  DVT prophylaxis: Heparin IV Code Status: Full Family Communication: None at bedside Disposition Plan: Patient presents from and will likely return to home. Currently remains with severe symptoms, exertional hypoxemia, and work up is incomplete. Requiring IV steroids and antibiotics as well as specialist consultation.   Consultants:   Nephrology  Pulmonary  Procedures:   Echocardiogram ordered  Antimicrobials:  Vancomycin 2/10  Cefepime 2/11 >>    Subjective: Chest pain is improved with oxycodone and dilaudid, still present with deep breaths and exertion on bilateral lower ribs. No cough, central chest pain. Has BSC abutting the bed because she is unable to go farther due to severe dyspnea with any exertion, still on 1L O2 with exertion.   Objective: Vitals:   04/18/19 2141 04/19/19 0005 04/19/19 0546 04/19/19 0744  BP: (!) 175/113 (!) 162/107 (!) 165/106   Pulse: (!) 103 93 (!) 103 (!) 105  Resp:  '18 20 20  '$ Temp:  98.1 F (36.7 C) 98 F (36.7 C)   TempSrc:  Oral Oral   SpO2:  97% 97% 92%  Weight:  75.8 kg     Intake/Output Summary (Last 24 hours) at 04/19/2019 0850 Last data filed at 04/19/2019 0330 Gross per 24 hour  Intake 4976.63 ml  Output 3950 ml  Net 1026.63 ml    Filed Weights   04/19/19 0546  Weight: 75.8 kg   Gen: 39 y.o. female in no distress at rest Pulm: Non-labored breathing tachypnea, diminished in RLL with scant crackles in mid zone. Upper airway wheezing also noted.  CV: Regular rate and rhythm. No murmur, rub, or gallop. No JVD, no pitting pedal edema. GI: Abdomen soft, ptouberant without tenderness or distention. PD cath site c/d/i. +BS. Ext: Warm, no deformities Skin: No rashes, lesions or ulcers Neuro: Alert and oriented. No focal neurological deficits. Psych: Judgement and insight appear normal. Mood & affect appropriate.   Data Reviewed: I have personally reviewed following labs and imaging studies  CBC: Recent Labs  Lab 04/12/19 1953 04/13/19 1143 04/18/19 0010 04/19/19 0123  WBC 14.7* 12.6* 9.4 8.5  HGB 9.2* 8.6* 8.3* 8.3*  HCT 27.5* 26.9* 25.3* 25.6*  MCV 69.6* 70.8* 69.5* 69.9*  PLT 523* 479* 463* 478*   Basic Metabolic Panel: Recent Labs  Lab 04/12/19 1953 04/13/19 1143 04/14/19 0552 04/18/19 0010 04/19/19 0123  NA 141  --  137 137 135  K 3.9  --  4.0 3.3* 3.7  CL 104  --  103 98 97*  CO2 22  --  19* 22 20*  GLUCOSE 98  --  94 109* 145*  BUN 83*  --  89* 70* 70*  CREATININE 8.08*  --  8.30* 9.14* 9.23*  CALCIUM 8.6*  --  8.2* 8.0* 8.5*  PHOS  --  5.4* 6.8*  --   --    GFR: Estimated Creatinine Clearance: 8.1 mL/min (A) (by C-G formula based on SCr of 9.23 mg/dL (H)). Liver Function Tests: Recent Labs  Lab 04/12/19 1953 04/14/19 0552 04/19/19 0123  AST 14*  --  14*  ALT 9  --  11  ALKPHOS 82  --  81  BILITOT 0.6  --  0.4  PROT 7.1  --  6.8  ALBUMIN 3.5 2.7* 2.9*   Recent Labs  Lab 04/12/19 1953  LIPASE 59*   No results for input(s): AMMONIA in the last 168 hours. Coagulation Profile: Recent Labs  Lab 04/18/19 0010  INR 1.0   Cardiac Enzymes: No results for input(s): CKTOTAL, CKMB, CKMBINDEX, TROPONINI in the last 168 hours. BNP (last 3 results) No results for input(s): PROBNP  in the last 8760 hours. HbA1C: No results for input(s): HGBA1C in the last 72 hours. CBG: No results for input(s): GLUCAP in the last 168 hours. Lipid Profile: No results for input(s): CHOL, HDL, LDLCALC, TRIG, CHOLHDL, LDLDIRECT in the last 72 hours. Thyroid Function Tests: No results for input(s): TSH, T4TOTAL, FREET4, T3FREE, THYROIDAB in the last 72 hours. Anemia Panel: No results for input(s): VITAMINB12, FOLATE, FERRITIN, TIBC, IRON, RETICCTPCT in the last 72 hours. Urine analysis:    Component Value Date/Time   COLORURINE YELLOW 10/03/2018 2332   APPEARANCEUR HAZY (A) 10/03/2018 2332   LABSPEC 1.014 10/03/2018 2332   PHURINE 5.0 10/03/2018 2332   GLUCOSEU NEGATIVE 10/03/2018 2332   HGBUR SMALL (A) 10/03/2018 2332   BILIRUBINUR NEGATIVE 10/03/2018 2332   KETONESUR NEGATIVE 10/03/2018 2332   PROTEINUR 100 (A) 10/03/2018 2332   NITRITE NEGATIVE 10/03/2018 2332   LEUKOCYTESUR NEGATIVE 10/03/2018 2332   Recent Results (from the past 240 hour(s))  Respiratory Panel by RT PCR (  Flu A&B, Covid) - Nasopharyngeal Swab     Status: None   Collection Time: 04/12/19  9:16 PM   Specimen: Nasopharyngeal Swab  Result Value Ref Range Status   SARS Coronavirus 2 by RT PCR NEGATIVE NEGATIVE Final    Comment: (NOTE) SARS-CoV-2 target nucleic acids are NOT DETECTED. The SARS-CoV-2 RNA is generally detectable in upper respiratoy specimens during the acute phase of infection. The lowest concentration of SARS-CoV-2 viral copies this assay can detect is 131 copies/mL. A negative result does not preclude SARS-Cov-2 infection and should not be used as the sole basis for treatment or other patient management decisions. A negative result may occur with  improper specimen collection/handling, submission of specimen other than nasopharyngeal swab, presence of viral mutation(s) within the areas targeted by this assay, and inadequate number of viral copies (<131 copies/mL). A negative result must be  combined with clinical observations, patient history, and epidemiological information. The expected result is Negative. Fact Sheet for Patients:  PinkCheek.be Fact Sheet for Healthcare Providers:  GravelBags.it This test is not yet ap proved or cleared by the [place] FDA and  has been authorized for detection and/or diagnosis of SARS-CoV-2 by FDA under an Emergency Use Authorization (EUA). This EUA will remain  in effect (meaning this test can be used) for the duration of the COVID-19 declaration under Section 564(b)(1) of the Act, 21 U.S.C. section 360bbb-3(b)(1), unless the authorization is terminated or revoked sooner.    Influenza A by PCR NEGATIVE NEGATIVE Final   Influenza B by PCR NEGATIVE NEGATIVE Final    Comment: (NOTE) The Xpert Xpress SARS-CoV-2/FLU/RSV assay is intended as an aid in  the diagnosis of influenza from Nasopharyngeal swab specimens and  should not be used as a sole basis for treatment. Nasal washings and  aspirates are unacceptable for Xpert Xpress SARS-CoV-2/FLU/RSV  testing. Fact Sheet for Patients: PinkCheek.be Fact Sheet for Healthcare Providers: GravelBags.it This test is not yet approved or cleared by the [place] FDA and  has been authorized for detection and/or diagnosis of SARS-CoV-2 by  FDA under an Emergency Use Authorization (EUA). This EUA will remain  in effect (meaning this test can be used) for the duration of the  Covid-19 declaration under Section 564(b)(1) of the Act, 21  U.S.C. section 360bbb-3(b)(1), unless the authorization is  terminated or revoked. Performed at [facility], [place] [address] [place], [place] [address]   Culture, blood (routine x 2)     Status: None   Collection Time: 04/12/19  9:16 PM   Specimen: BLOOD RIGHT FOREARM  Result Value Ref Range Status   Specimen Description    Final    BLOOD RIGHT FOREARM Performed at [facility] [address] [place], [place] [address]    Special Requests   Final    BOTTLES DRAWN AEROBIC AND ANAEROBIC Blood Culture adequate volume Performed at [facility] [address], [place], [place] [address]    Culture   Final    NO GROWTH 5 DAYS Performed at [facility], [place] [address] [place], [place] [address]    Report Status 04/17/2019 FINAL  Final  Culture, blood (routine x 2)     Status: None   Collection Time: 04/12/19  9:16 PM   Specimen: BLOOD  Result Value Ref Range Status   Specimen Description   Final    BLOOD LEFT ANTECUBITAL Performed at [facility] [address] [place],  [address]  Special Requests   Final    BOTTLES DRAWN AEROBIC AND ANAEROBIC Blood Culture adequate volume Performed at [facility] [address], [place], [place] [address]    Culture   Final    NO GROWTH 5 DAYS Performed at [facility], [place] [address] [place], [place] [address]    Report Status 04/17/2019 FINAL  Final  SARS CORONAVIRUS 2 (TAT 6-24 HRS) Nasopharyngeal Nasopharyngeal Swab     Status: None   Collection Time: 04/18/19  2:00 AM   Specimen: Nasopharyngeal Swab  Result Value Ref Range Status   SARS Coronavirus 2 NEGATIVE NEGATIVE Final    Comment: (NOTE) SARS-CoV-2 target nucleic acids are NOT DETECTED. The SARS-CoV-2 RNA is generally detectable in upper and lower respiratory specimens during the acute phase of infection. Negative results do not preclude SARS-CoV-2 infection, do not rule out co-infections with other pathogens, and should not be used as the sole basis for treatment or other patient management decisions. Negative results must be combined with clinical observations, patient history, and epidemiological information. The expected result is Negative. Fact Sheet for  Patients: SugarRoll.be Fact Sheet for Healthcare Providers: https://www.woods-mathews.com/ This test is not yet approved or cleared by the [place] FDA and  has been authorized for detection and/or diagnosis of SARS-CoV-2 by FDA under an Emergency Use Authorization (EUA). This EUA will remain  in effect (meaning this test can be used) for the duration of the COVID-19 declaration under Section 56 4(b)(1) of the Act, 21 U.S.C. section 360bbb-3(b)(1), unless the authorization is terminated or revoked sooner. Performed at [facility], [place] [address] [place], [place] [address]       Radiology Studies: DG Chest 2 View  Result Date: 04/18/2019 CLINICAL DATA:  Chest pain EXAM: CHEST - 2 VIEW COMPARISON:  04/12/2019 FINDINGS: Persistent right lower lobe consolidation with small right pleural effusion unchanged. Left lung is clear. IMPRESSION: Unchanged right lower lobe consolidation and small right pleural effusion. Electronically Signed   By: Ulyses Jarred M.D.   On: 04/18/2019 00:35   VAS [place] LOWER EXTREMITY VENOUS (DVT)  Result Date: 04/18/2019  Lower Venous DVTStudy Indications: Swelling.  Comparison Study: No prior study Performing Technologist: Maudry Mayhew MHA, RDMS, RVT, RDCS  Examination Guidelines: A complete evaluation includes B-mode imaging, spectral Doppler, color Doppler, and power Doppler as needed of all accessible portions of each vessel. Bilateral testing is considered an integral part of a complete examination. Limited examinations for reoccurring indications may be performed as noted. The reflux portion of the exam is performed with the patient in reverse Trendelenburg.  +---------+---------------+---------+-----------+----------+--------------+ RIGHT    CompressibilityPhasicitySpontaneityPropertiesThrombus Aging +---------+---------------+---------+-----------+----------+--------------+ CFV      Full           Yes       Yes                                 +---------+---------------+---------+-----------+----------+--------------+ SFJ      Full                                                        +---------+---------------+---------+-----------+----------+--------------+ FV Prox  Full                                                        +---------+---------------+---------+-----------+----------+--------------+  FV Mid   Full                                                        +---------+---------------+---------+-----------+----------+--------------+ FV DistalFull                                                        +---------+---------------+---------+-----------+----------+--------------+ PFV      Full                                                        +---------+---------------+---------+-----------+----------+--------------+ POP      Full           Yes      Yes                                 +---------+---------------+---------+-----------+----------+--------------+ PTV      Full                                                        +---------+---------------+---------+-----------+----------+--------------+ PERO     Full                                                        +---------+---------------+---------+-----------+----------+--------------+   +---------+---------------+---------+-----------+----------+--------------+ LEFT     CompressibilityPhasicitySpontaneityPropertiesThrombus Aging +---------+---------------+---------+-----------+----------+--------------+ CFV      Full           Yes      Yes                                 +---------+---------------+---------+-----------+----------+--------------+ SFJ      Full                                                        +---------+---------------+---------+-----------+----------+--------------+ FV Prox  Full                                                         +---------+---------------+---------+-----------+----------+--------------+ FV Mid   Full                                                        +---------+---------------+---------+-----------+----------+--------------+  FV DistalFull                                                        +---------+---------------+---------+-----------+----------+--------------+ PFV      Full                                                        +---------+---------------+---------+-----------+----------+--------------+ POP      Full           Yes      Yes                                 +---------+---------------+---------+-----------+----------+--------------+ PTV      Full                                                        +---------+---------------+---------+-----------+----------+--------------+ PERO     Full                                                        +---------+---------------+---------+-----------+----------+--------------+     Summary: RIGHT: - There is no evidence of deep vein thrombosis in the lower extremity.  - No cystic structure found in the popliteal fossa.  LEFT: - There is no evidence of deep vein thrombosis in the lower extremity.  - No cystic structure found in the popliteal fossa.  *See table(s) above for measurements and observations. Electronically signed by Servando Snare MD on 04/18/2019 at 6:33:11 PM.    Final     Scheduled Meds: . Chlorhexidine Gluconate Cloth  6 each Topical Daily  . furosemide  40 mg Intravenous BID  . gentamicin cream  1 application Topical Daily  . dianeal solution for CAPD/CCPD with heparin   Peritoneal Dialysis Once  . hydrALAZINE  100 mg Oral TID  . ipratropium-albuterol  3 mL Nebulization BID  . labetalol  300 mg Oral BID  . methylPREDNISolone (SOLU-MEDROL) injection  40 mg Intravenous BID  . NIFEdipine  60 mg Oral BID  . sodium chloride flush  3 mL Intravenous Once   Continuous Infusions: . ceFEPime (MAXIPIME)  IV 200 mL/hr at 04/18/19 1541  . dialysis solution 2.5% low-MG    . heparin 1,150 Units/hr (04/19/19 0307)     LOS: 0 days   Time spent: 35 minutes.  Patrecia Pour, MD [facility] [email or web address] 04/19/2019, 8:50 AM

## 2019-04-19 NOTE — H&P (Signed)
Patient Status: [facility] - In-pt  Assessment and Plan:  39 y/o F with ESRD on peritoneal dialysis now requiring acute hemodialysis due to volume overload and dyspnea - IR has been asked to place a temporary hemodialysis catheter for urgent dialysis today.  Patient is agreeable to proceed with temporary catheter placement - plan for temporary catheter placement today in IR, we will call for patient when ready.  Risks and benefits discussed with the patient including, but not limited to bleeding, infection, vascular injury, pneumothorax which may require chest tube placement, air embolism or even death.  All of the patient's questions were answered, patient is agreeable to proceed.  Consent signed and in chart. ______________________________________________________________________   History of Present Illness: Latasha Drake is a 39 y.o. female with past medical history significant for anemia, HTN, lupus and ESRD on PD at home who presented to [facility] ED yesterday afternoon with complaints of dyspnea and pleuritic chest pain. She had recently been admitted for right lower lobe pneumonia. Initial evaluation notable for dyspnea without hypoxia at rest however SpO2 in the 80s with exertion, creatinine 9.14, WBC 9.4, hgb 8.3, plt 463, negative troponins, D-dimer 13.31, negative Covid. CXR essentially unchanged from previous admission. She was started on heparin gtt due to elevated d-dimer, dyspnea and persistent pleuritic chest pain concerning for PE. She has been admitted by the hospitalist service and nephrology was consulted who has recommended urgent HD due to volume overload/dyspnea. IR has been asked to place a temporary HD catheter for short term HD.  Patient sitting up in bed, gown and shirt tangled in wires which she is trying to fix. She reports unchanged dyspnea and chest pain. She states the nephrologist told her about needing HD and the temporary catheter earlier today and she is agreeable to  both.  Allergies and medications reviewed.   Review of Systems: A 12 point ROS discussed and pertinent positives are indicated in the HPI above.  All other systems are negative.  Review of Systems  Constitutional: Negative for chills and fever.  Respiratory: Positive for shortness of breath. Negative for cough.   Cardiovascular: Positive for chest pain.  Gastrointestinal: Negative for abdominal pain, diarrhea, nausea and vomiting.  Musculoskeletal: Negative for back pain.  Neurological: Negative for dizziness and headaches.    Vital Signs: BP (!) 165/106 (BP Location: Left Arm)   Pulse (!) 105   Temp 98 F (36.7 C) (Oral)   Resp 20   Ht 5\' 4"  (1.626 m)   Wt 167 lb 3.2 oz (75.8 kg)   LMP 02/24/2019   SpO2 92%   BMI 28.70 kg/m   Physical Exam Vitals and nursing note reviewed.  Constitutional:      General: She is not in acute distress.    Appearance: She is not ill-appearing.  HENT:     Head: Normocephalic.  Cardiovascular:     Rate and Rhythm: Regular rhythm. Tachycardia present.  Pulmonary:     Effort: Pulmonary effort is normal.     Breath sounds: Normal breath sounds.  Abdominal:     Palpations: Abdomen is soft.     Comments: (+) PD catheter dressed appropriately.   Skin:    General: Skin is warm and dry.  Neurological:     Mental Status: She is alert and oriented to person, place, and time.  Psychiatric:        Mood and Affect: Mood normal.        Behavior: Behavior normal.  Thought Content: Thought content normal.        Judgment: Judgment normal.      Imaging reviewed.   Labs:  COAGS: Recent Labs    04/18/19 0010  INR 1.0    BMP: Recent Labs    04/12/19 1953 04/14/19 0552 04/18/19 0010 04/19/19 0123  NA 141 137 137 135  K 3.9 4.0 3.3* 3.7  CL 104 103 98 97*  CO2 22 19* 22 20*  GLUCOSE 98 94 109* 145*  BUN 83* 89* 70* 70*  CALCIUM 8.6* 8.2* 8.0* 8.5*  CREATININE 8.08* 8.30* 9.14* 9.23*  GFRNONAA 6* 5* 5* 5*  GFRAA 7* 6* 6*  6*       Electronically Signed: Joaquim Nam, PA-C 04/19/2019, 10:55 AM   I spent a total of 15 minutes in face to face in clinical consultation, greater than 50% of which was counseling/coordinating care for venous access.

## 2019-04-20 LAB — CBC
HCT: 24.4 % — ABNORMAL LOW (ref 36.0–46.0)
Hemoglobin: 8 g/dL — ABNORMAL LOW (ref 12.0–15.0)
MCH: 22.9 pg — ABNORMAL LOW (ref 26.0–34.0)
MCHC: 32.8 g/dL (ref 30.0–36.0)
MCV: 69.9 fL — ABNORMAL LOW (ref 80.0–100.0)
Platelets: 478 10*3/uL — ABNORMAL HIGH (ref 150–400)
RBC: 3.49 MIL/uL — ABNORMAL LOW (ref 3.87–5.11)
RDW: 17 % — ABNORMAL HIGH (ref 11.5–15.5)
WBC: 11.9 10*3/uL — ABNORMAL HIGH (ref 4.0–10.5)
nRBC: 0 % (ref 0.0–0.2)

## 2019-04-20 LAB — IRON AND TIBC
Iron: 85 ug/dL (ref 28–170)
Saturation Ratios: 36 % — ABNORMAL HIGH (ref 10.4–31.8)
TIBC: 238 ug/dL — ABNORMAL LOW (ref 250–450)
UIBC: 153 ug/dL

## 2019-04-20 LAB — BASIC METABOLIC PANEL
Anion gap: 14 (ref 5–15)
BUN: 46 mg/dL — ABNORMAL HIGH (ref 6–20)
CO2: 22 mmol/L (ref 22–32)
Calcium: 8.3 mg/dL — ABNORMAL LOW (ref 8.9–10.3)
Chloride: 102 mmol/L (ref 98–111)
Creatinine, Ser: 6.32 mg/dL — ABNORMAL HIGH (ref 0.44–1.00)
GFR calc Af Amer: 9 mL/min — ABNORMAL LOW (ref >60)
GFR calc non Af Amer: 8 mL/min — ABNORMAL LOW (ref >60)
Glucose, Bld: 140 mg/dL — ABNORMAL HIGH (ref 70–99)
Potassium: 3.8 mmol/L (ref 3.5–5.1)
Sodium: 138 mmol/L (ref 135–145)

## 2019-04-20 LAB — HEPARIN LEVEL (UNFRACTIONATED): Heparin Unfractionated: 0.59 IU/mL (ref 0.30–0.70)

## 2019-04-20 LAB — BRAIN NATRIURETIC PEPTIDE: B Natriuretic Peptide: 508.3 pg/mL — ABNORMAL HIGH (ref 0.0–100.0)

## 2019-04-20 LAB — VITAMIN B12: Vitamin B-12: 270 pg/mL (ref 180–914)

## 2019-04-20 LAB — RETICULOCYTES
Immature Retic Fract: 18.9 % — ABNORMAL HIGH (ref 2.3–15.9)
RBC.: 3.47 MIL/uL — ABNORMAL LOW (ref 3.87–5.11)
Retic Count, Absolute: 64.2 10*3/uL (ref 19.0–186.0)
Retic Ct Pct: 1.9 % (ref 0.4–3.1)

## 2019-04-20 LAB — FOLATE: Folate: 4.4 ng/mL — ABNORMAL LOW (ref >5.9)

## 2019-04-20 LAB — FERRITIN: Ferritin: 242 ng/mL (ref 11–307)

## 2019-04-20 LAB — PROCALCITONIN: Procalcitonin: 0.3 ng/mL

## 2019-04-20 MED ORDER — OXYCODONE HCL 5 MG PO TABS
ORAL_TABLET | ORAL | Status: AC
  Administered 2019-04-20: 5 mg via ORAL
  Filled 2019-04-20: qty 1

## 2019-04-20 MED ORDER — DARBEPOETIN ALFA 40 MCG/0.4ML IJ SOSY: 40.0000 ug | PREFILLED_SYRINGE | INTRAMUSCULAR | Status: DC

## 2019-04-20 MED ORDER — HEPARIN SODIUM (PORCINE) 1000 UNIT/ML IJ SOLN
INTRAMUSCULAR | Status: AC
  Administered 2019-04-20: 15:00:00 2600 [IU]
  Filled 2019-04-20: qty 3

## 2019-04-20 MED ORDER — DARBEPOETIN ALFA 40 MCG/0.4ML IJ SOSY
PREFILLED_SYRINGE | INTRAMUSCULAR | Status: AC
  Filled 2019-04-20: qty 0.4

## 2019-04-20 MED ORDER — DARBEPOETIN ALFA 40 MCG/0.4ML IJ SOSY
PREFILLED_SYRINGE | INTRAMUSCULAR | Status: AC
  Administered 2019-04-20: 40 ug via INTRAVENOUS
  Filled 2019-04-20: qty 0.4

## 2019-04-20 NOTE — Progress Notes (Signed)
CCMD called to report 4 beat run of vatch  RN went to bedside Pt denies chest pain  Pt resting in bed comfortably  Vital signs documented  Paged MD

## 2019-04-20 NOTE — Progress Notes (Signed)
ANTICOAGULATION CONSULT NOTE - Follow Up Consult  Pharmacy Consult for Heparin  Indication: pulmonary embolus  Allergies  Allergen Reactions  . Lisinopril Swelling   Weight: 76.1 kg Height: 64 inches Heparin Dosing Weight: 70.7 kg  Vital Signs: Temp: 98.4 F (36.9 C) (02/13 0615) Temp Source: Oral (02/13 0615) BP: 163/100 (02/13 0807) Pulse Rate: 105 (02/13 0807)  Labs: Recent Labs    04/18/19 0010 04/18/19 0010 04/18/19 1522 04/19/19 0123 04/20/19 0650  HGB 8.3*   < >  --  8.3* 8.0*  HCT 25.3*  --   --  25.6* 24.4*  PLT 463*  --   --  454* 478*  LABPROT 13.5  --   --   --   --   INR 1.0  --   --   --   --   HEPARINUNFRC  --   --  0.30 0.59 0.59  CREATININE 9.14*  --   --  9.23* 6.32*  TROPONINIHS 16  --   --   --   --    < > = values in this interval not displayed.   Estimated Creatinine Clearance: 11.5 mL/min (A) (by C-G formula based on SCr of 6.32 mg/dL (H)).  Medical History: Past Medical History:  Diagnosis Date  . Anemia   . Chronic kidney disease   . Hypertension   . Lupus ([place])   . Pericardial effusion 04/13/2019   circumferential  . Pneumonia 04/12/2019  . Pulmonary hypertension ([place]) 04/13/2019   moderate   Assessment: 39 yr old female with rib pain, D-dimer is elevated at 13.31. Pt was here last week and had a D-dimer of 16 and intermediate probability of PE on VQ scan and was not started on any anticoagulation. 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 Per med rec, pt was not on anticoagulation PTA.  Heparin level 0.59 (therapuetic) on Heparin gtt 1150 units/hr; H/H 8.0/24.4 (low but stable), plts 478 (stable). Per RN, no issues with IV or bleeding observed.  Goal of Therapy:  Heparin level 0.3-0.7 units/ml Monitor platelets by anticoagulation protocol: Yes   Plan:  Continue heparin infusion at 1150 units/hr Monitor daily heparin level, CBC Monitor for signs/symptoms of bleeding  Acey Lav, PharmD  PGY1 Acute Care  Pharmacy Resident 04/20/2019 11:32 AM

## 2019-04-20 NOTE — Progress Notes (Signed)
Pt states it is burning when using the restroom  Informed MD  MD stated to collect urine sample  Educated pt  Pt verbalized understanding  Collection hat placed in toilet  Will continue to monitor

## 2019-04-20 NOTE — Progress Notes (Signed)
Occupational Therapy Evaluation Patient Details Name: Latasha Drake MRN: [id number] DOB: 1980-08-13 Today's Date: 04/20/2019    History of Present Illness Pt adm with chest pain and acute hypoxic respiratory failure. Multifactorial with unclear etiology PNA vs lupus flare vs PE. Pt with heparin initiated 2/11 AM and therapeutic.  Pt to have HD catheter placed 2/12 with HD to follow. Pt with recent admission due to PNA. PMH - lupus, HTN, ESRD on PD.    Clinical Impression   PTA, pt was living at home with her 40 year old daughter and 2 dogs, pt reports she has been needing some assistance with household activities due to decreased activity tolerance.  Pt currently able to complete ADL and simple IADL in the room with modified independence, requiring increased time and effort, use of energy conservation strategies including frequent seated rest breaks. Educated pt on energy conservation strategies with provided handout. Pt verbalized understanding and reports a few current strategies in place. Pt would benefit from [facility] to maximize independence with ADL/IADL completion. However, pt reports she does not want to follow up with HHOT at this time. Provided pt with resources and education to maximize progression with independence with ADL/IADL completion. No additional acute needs identified at this time. OT will sign off. Thank you for referral.    Follow Up Recommendations  Home health OT    Equipment Recommendations  3 in 1 bedside commode    Recommendations for Other Services       Precautions / Restrictions Precautions Precautions: None Restrictions Weight Bearing Restrictions: No      Mobility Bed Mobility Overal bed mobility: Independent                Transfers Overall transfer level: Modified independent Equipment used: None             General transfer comment: Incr time    Balance Overall balance assessment: No apparent balance deficits (not formally assessed)                                          ADL either performed or assessed with clinical judgement   ADL Overall ADL's : Modified independent                                       General ADL Comments: able to complete ADL with increased time and effort, educated pt on energy conservation strategies with provided handout. pt verbalized current use of a few energy conservation strategies as well     Vision   Vision Assessment?: No apparent visual deficits     Perception     Praxis      Pertinent Vitals/Pain Pain Assessment: No/denies pain     Hand Dominance Right   Extremity/Trunk Assessment Upper Extremity Assessment Upper Extremity Assessment: Generalized weakness   Lower Extremity Assessment Lower Extremity Assessment: Generalized weakness   Cervical / Trunk Assessment Cervical / Trunk Assessment: Normal   Communication Communication Communication: No difficulties   Cognition Arousal/Alertness: Awake/alert Behavior During Therapy: WFL for tasks assessed/performed Overall Cognitive Status: Within Functional Limits for tasks assessed                                     General  Comments  vss    Exercises     Shoulder Instructions      Home Living Family/patient expects to be discharged to:: Private residence Living Arrangements: Children Available Help at Discharge: Family;Available PRN/intermittently Type of Home: Apartment Home Access: Stairs to enter Entrance Stairs-Number of Steps: 1   Home Layout: One level     Bathroom Shower/Tub: Teacher, early years/pre: Standard     Home Equipment: None          Prior Functioning/Environment Level of Independence: Needs assistance  Gait / Transfers Assistance Needed: independent but limited distance/activity tolerance ADL's / Homemaking Assistance Needed: Assist for homemaking   Comments: Pt reports she has had a decline in ability to perform  household activities due to weakness and SOB        OT Problem List: Cardiopulmonary status limiting activity      OT Treatment/Interventions:      OT Goals(Current goals can be found in the care plan section) Acute Rehab OT Goals Patient Stated Goal: be able to perform household activities that she needs and wants to do OT Goal Formulation: With patient Time For Goal Achievement: 04/27/19 Potential to Achieve Goals: Good  OT Frequency:     Barriers to D/C:            Co-evaluation              AM-PAC OT "6 Clicks" Daily Activity     Outcome Measure Help from another person eating meals?: None Help from another person taking care of personal grooming?: None Help from another person toileting, which includes using toliet, bedpan, or urinal?: None Help from another person bathing (including washing, rinsing, drying)?: None Help from another person to put on and taking off regular upper body clothing?: None Help from another person to put on and taking off regular lower body clothing?: None 6 Click Score: 24   End of Session Nurse Communication: Mobility status  Activity Tolerance: Patient tolerated treatment well Patient left: in bed;with call bell/phone within reach  OT Visit Diagnosis: Other abnormalities of gait and mobility (R26.89)                Time: 0910-0920 OT Time Calculation (min): 10 min Charges:  OT General Charges $OT Visit: 1 Visit OT Evaluation $OT Eval Low Complexity: [place] OTR/L Acute Rehabilitation Services Office: [phone]   Wyn Forster 04/20/2019, 9:27 AM

## 2019-04-20 NOTE — Progress Notes (Addendum)
[facility] Progress Note   Subjective:  Had dialysis yesterday with 2.9L removed.  Feels much better after dialysis. Breathing improved significantly. Chest tightness resolved.   Objective Vitals:   04/19/19 2144 04/20/19 0013 04/20/19 0615 04/20/19 0807  BP: (!) 161/87 (!) 156/101 (!) 161/99 (!) 163/100  Pulse: (!) 105 92 99 (!) 105  Resp:  18 18 18   Temp:  98.6 F (37 C) 98.4 F (36.9 C)   TempSrc:  Oral Oral   SpO2:   93% 93%  Weight:   71.5 kg   Height:        Weight change: 0.959 kg   Additional Objective Labs: Basic Metabolic Panel: Recent Labs  Lab 04/13/19 1143 04/14/19 0552 04/14/19 0552 04/18/19 0010 04/19/19 0123 04/20/19 0650  NA  --  137   < > 137 135 138  K  --  4.0   < > 3.3* 3.7 3.8  CL  --  103   < > 98 97* 102  CO2  --  19*   < > 22 20* 22  GLUCOSE  --  94   < > 109* 145* 140*  BUN  --  89*   < > 70* 70* 46*  CREATININE  --  8.30*   < > 9.14* 9.23* 6.32*  CALCIUM  --  8.2*   < > 8.0* 8.5* 8.3*  PHOS 5.4* 6.8*  --   --   --   --    < > = values in this interval not displayed.   CBC: Recent Labs  Lab 04/13/19 1143 04/13/19 1143 04/18/19 0010 04/19/19 0123 04/20/19 0650  WBC 12.6*   < > 9.4 8.5 11.9*  HGB 8.6*   < > 8.3* 8.3* 8.0*  HCT 26.9*   < > 25.3* 25.6* 24.4*  MCV 70.8*  --  69.5* 69.9* 69.9*  PLT 479*   < > 463* 454* 478*   < > = values in this interval not displayed.   Blood Culture    Component Value Date/Time   SDES BLOOD SITE NOT SPECIFIED 04/18/2019 0540   SPECREQUEST  04/18/2019 0540    BOTTLES DRAWN AEROBIC AND ANAEROBIC Blood Culture results may not be optimal due to an inadequate volume of blood received in culture bottles   CULT  04/18/2019 0540    NO GROWTH 1 DAY Performed at [facility], [place] [address], [place], [place] [address]    REPTSTATUS PENDING 04/18/2019 0540     Physical Exam General: Young female, alert, nad  Heart: Tachy, regular  Lungs: Decreased in R base; No wheezing  today  Abdomen: soft; non-tender  Extremities: No sig LE edema  Dialysis Access: PD cath in place  Medications: . ceFEPime (MAXIPIME) IV 1 g (04/19/19 1843)  . dialysis solution 2.5% low-MG    . heparin 1,150 Units/hr (04/20/19 0156)   . Chlorhexidine Gluconate Cloth  6 each Topical Daily  . [START ON 04/22/2019] darbepoetin (ARANESP) injection - DIALYSIS  40 mcg Intravenous Q Sat-HD  . furosemide  40 mg Intravenous BID  . gentamicin cream  1 application Topical Daily  . dianeal solution for CAPD/CCPD with heparin   Peritoneal Dialysis Once  . hydrALAZINE  100 mg Oral TID  . ipratropium-albuterol  3 mL Nebulization BID  . labetalol  300 mg Oral BID  . methylPREDNISolone (SOLU-MEDROL) injection  40 mg Intravenous BID  . NIFEdipine  60 mg Oral BID  . sodium chloride flush  3 mL Intravenous  Once    Dialysis Orders: CAPD Followed by Dr. Neta Ehlers @ [facility]  Assessment/Plan: 1. Dyspnea/Pleurtic CP. Recent treatment for PNA. Repeat CXR 2/11 showing unchanged RLL consolidation, small pleural effusion. Intermediate probability for PE per nuclear scan. On empiric heparin, antibiotics per primary. Significant improvement after dialysis 2/12. Will plan another short HD today 2/13 for volume removal.  2. ESRD - Usually CAPD. >> Unable to do manual drains d/t staffing here. Temp cath placed 2/12 for HD.  3. HTN/volume- BP elevated. On labetalol, Procardia, Hydralazine. Follow BP after HD. HD again today 2L UF  4. Anemia- Will give Aranesp 40 with HD Sat. Tsat 36%  5. MBD of CKD -  Continue home binders/Vit D  6. Nutrition - Renal diet/fluid restrictions   Lynnda Child PA-C [facility] Pager [phone] 04/20/2019,9:03 AM  LOS: 1 day   Pt seen, examined and agree w assess/plan as above with additions as indicated. We should prob dc her IV heparin which was for questionable PE as cause of SOB, turns out she was wet and SOB now resolved after HD x 1.  Will get HD again today using  temp cath.  [facility] 04/20/2019, 10:45 AM

## 2019-04-20 NOTE — Progress Notes (Signed)
MD returned page MD aware of 4 beats of vtach and vital signs  MD stated to turn off heparin drip  Will continue to monitor

## 2019-04-20 NOTE — Plan of Care (Signed)
  Problem: Education: Goal: Knowledge of General Education information will improve Description Including pain rating scale, medication(s)/side effects and non-pharmacologic comfort measures Outcome: Progressing   

## 2019-04-20 NOTE — Progress Notes (Signed)
PROGRESS NOTE  Latasha Drake  [id number] DOB: 07-10-1980 DOA: 04/17/2019 PCP: Katherina Mires, MD   Brief Narrative: Latasha Drake is a 39 y.o. female with a history of SLE, ESRD on PD still making urine, recently chronic prednisone use, AOCD, and recent admission for RLL pneumonia discharged on augmentin 2/7 only to return to the ED 2/11 with dyspnea and pleuritic chest pain. At that admission, d-dimer was elevated and V/Q scan was intermediate probability for PE, though no anticoagulation started. On return today she had no significant change on CXR showing significant RLL consolidation and d-dimer 13.31. She was started on IV heparin empirically with steroids/antibiotic. Nephrology consulted for PD and pulmonology consulted for continued respiratory complaints Assessment & Plan: Principal Problem:   Chest pain Active Problems:   Lupus ([place])   Hypertension   Symptomatic anemia   Pneumonia   ESRD (end stage renal disease) ([place])   Acute respiratory failure with hypoxia (HCC)  Acute hypoxemic respiratory failure:  -Likely due to Fluid Overload -Repeat echocardiogram 04/19/2019-negative for tamponade -CXR noted persistent unchanged right lower lobe infiltrate -CT on 04/12/2019 had indicated moderate to severe right lower lobe consolidation with bilateral pleural effusion, small pericardial effusion  -COVID-19 was negative -Patient continued antibiotics with IV heparin for probable PE on account of recent indeterminate V/Q report -Pulmonary critical care consulted-respiratory problems felt to be 2/2 splinting / alveolar hypoventilation from pleuritic chest pain, and felt that there was a element of edema chest x-ray and recommended diuresis.  Pulmonary also agreed with anticoagulation with antibiotic, and supportive care. - Pt underwent HD 04/19/2019 with 2.9 L fluid removal with significant improvement in SOB, off O2, with resolution of chest pains. - Heparin stopped - fluid overload felt to be  cause of presentation.    SLE, history of lupus nephritis and ESRD on PD:  - Nephrology consulted, will oversee dialysis  - Continue prednisone 20mg  - Avoid NSAIDs in this renal patient if possible.  HTN: Uncontrolled, argues against tamponade.  -  increased hydralazine 50mg  > 100mg  TID - Continue labetalol 300mg  BID - Continue nifedipine 60mg  BID  Anemia of chronic disease:  Possibly contributing to symptoms. Denies bleeding, has required transfusions in the past.  - Monitor daily. - Check iron studies, has reported hx chronic blood loss anemia.  DVT prophylaxis: Heparin  Code Status: Full Family Communication: None at bedside Disposition Plan: Consultants:   Nephrology  Pulmonary  Procedures:   Echocardiogram   Antimicrobials:  Vancomycin 2/10  Cefepime 2/11 >>    Subjective: No more chest pains, breathing better   Objective: Vitals:   04/20/19 1213 04/20/19 1332 04/20/19 1337 04/20/19 1400  BP: (!) 191/109 (!) 188/113 (!) 184/109 (!) 195/112  Pulse: 98 (!) 109 (!) 102 (!) 103  Resp: 18 20    Temp: 98.2 F (36.8 C) 98.1 F (36.7 C)    TempSrc: Oral Oral    SpO2:  96%    Weight:  72.5 kg    Height:        Intake/Output Summary (Last 24 hours) at 04/20/2019 1405 Last data filed at 04/20/2019 1216 Gross per 24 hour  Intake 930.91 ml  Output 2907 ml  Net -1976.09 ml   Filed Weights   04/19/19 1805 04/20/19 0615 04/20/19 1332  Weight: 72 kg 71.5 kg 72.5 kg   Gen: 39 y.o. female in no distress at rest Pulm:  Diminished with some mild wheezing.  CV: Regular rate and rhythm. No murmur, rub, or gallop. No JVD, no  pitting pedal edema. GI: Abdomen soft, ptouberant without tenderness or distention. PD cath site c/d/i. +BS. Ext: Warm, no deformities Skin: No rashes, lesions or ulcers Neuro: Alert and oriented. No focal neurological deficits. Psych: Judgement and insight appear normal. Mood & affect appropriate.   Data Reviewed: I have personally  reviewed following labs and imaging studies  CBC: Recent Labs  Lab 04/18/19 0010 04/19/19 0123 04/20/19 0650  WBC 9.4 8.5 11.9*  HGB 8.3* 8.3* 8.0*  HCT 25.3* 25.6* 24.4*  MCV 69.5* 69.9* 69.9*  PLT 463* 454* 123456*   Basic Metabolic Panel: Recent Labs  Lab 04/14/19 0552 04/18/19 0010 04/19/19 0123 04/20/19 0650  NA 137 137 135 138  K 4.0 3.3* 3.7 3.8  CL 103 98 97* 102  CO2 19* 22 20* 22  GLUCOSE 94 109* 145* 140*  BUN 89* 70* 70* 46*  CREATININE 8.30* 9.14* 9.23* 6.32*  CALCIUM 8.2* 8.0* 8.5* 8.3*  PHOS 6.8*  --   --   --    GFR: Estimated Creatinine Clearance: 11.5 mL/min (A) (by C-G formula based on SCr of 6.32 mg/dL (H)). Liver Function Tests: Recent Labs  Lab 04/14/19 0552 04/19/19 0123  AST  --  14*  ALT  --  11  ALKPHOS  --  81  BILITOT  --  0.4  PROT  --  6.8  ALBUMIN 2.7* 2.9*   No results for input(s): LIPASE, AMYLASE in the last 168 hours. No results for input(s): AMMONIA in the last 168 hours. Coagulation Profile: Recent Labs  Lab 04/18/19 0010  INR 1.0   Cardiac Enzymes: No results for input(s): CKTOTAL, CKMB, CKMBINDEX, TROPONINI in the last 168 hours. BNP (last 3 results) No results for input(s): PROBNP in the last 8760 hours. HbA1C: No results for input(s): HGBA1C in the last 72 hours. CBG: No results for input(s): GLUCAP in the last 168 hours. Lipid Profile: No results for input(s): CHOL, HDL, LDLCALC, TRIG, CHOLHDL, LDLDIRECT in the last 72 hours. Thyroid Function Tests: No results for input(s): TSH, T4TOTAL, FREET4, T3FREE, THYROIDAB in the last 72 hours. Anemia Panel: Recent Labs    04/20/19 0650  VITAMINB12 270  FOLATE 4.4*  FERRITIN 242  TIBC 238*  IRON 85  RETICCTPCT 1.9   Urine analysis:    Component Value Date/Time   COLORURINE YELLOW 10/03/2018 2332   APPEARANCEUR HAZY (A) 10/03/2018 2332   LABSPEC 1.014 10/03/2018 2332   PHURINE 5.0 10/03/2018 2332   GLUCOSEU NEGATIVE 10/03/2018 2332   HGBUR SMALL (A)  10/03/2018 2332   BILIRUBINUR NEGATIVE 10/03/2018 2332   KETONESUR NEGATIVE 10/03/2018 2332   PROTEINUR 100 (A) 10/03/2018 2332   NITRITE NEGATIVE 10/03/2018 2332   LEUKOCYTESUR NEGATIVE 10/03/2018 2332   Recent Results (from the past 240 hour(s))  Respiratory Panel by RT PCR (Flu A&B, Covid) - Nasopharyngeal Swab     Status: None   Collection Time: 04/12/19  9:16 PM   Specimen: Nasopharyngeal Swab  Result Value Ref Range Status   SARS Coronavirus 2 by RT PCR NEGATIVE NEGATIVE Final    Comment: (NOTE) SARS-CoV-2 target nucleic acids are NOT DETECTED. The SARS-CoV-2 RNA is generally detectable in upper respiratoy specimens during the acute phase of infection. The lowest concentration of SARS-CoV-2 viral copies this assay can detect is 131 copies/mL. A negative result does not preclude SARS-Cov-2 infection and should not be used as the sole basis for treatment or other patient management decisions. A negative result may occur with  improper specimen collection/handling, submission of specimen  other than nasopharyngeal swab, presence of viral mutation(s) within the areas targeted by this assay, and inadequate number of viral copies (<131 copies/mL). A negative result must be combined with clinical observations, patient history, and epidemiological information. The expected result is Negative. Fact Sheet for Patients:  PinkCheek.be Fact Sheet for Healthcare Providers:  GravelBags.it This test is not yet ap proved or cleared by the [place] FDA and  has been authorized for detection and/or diagnosis of SARS-CoV-2 by FDA under an Emergency Use Authorization (EUA). This EUA will remain  in effect (meaning this test can be used) for the duration of the COVID-19 declaration under Section 564(b)(1) of the Act, 21 U.S.C. section 360bbb-3(b)(1), unless the authorization is terminated or revoked sooner.    Influenza A by PCR  NEGATIVE NEGATIVE Final   Influenza B by PCR NEGATIVE NEGATIVE Final    Comment: (NOTE) The Xpert Xpress SARS-CoV-2/FLU/RSV assay is intended as an aid in  the diagnosis of influenza from Nasopharyngeal swab specimens and  should not be used as a sole basis for treatment. Nasal washings and  aspirates are unacceptable for Xpert Xpress SARS-CoV-2/FLU/RSV  testing. Fact Sheet for Patients: PinkCheek.be Fact Sheet for Healthcare Providers: GravelBags.it This test is not yet approved or cleared by the [place] FDA and  has been authorized for detection and/or diagnosis of SARS-CoV-2 by  FDA under an Emergency Use Authorization (EUA). This EUA will remain  in effect (meaning this test can be used) for the duration of the  Covid-19 declaration under Section 564(b)(1) of the Act, 21  U.S.C. section 360bbb-3(b)(1), unless the authorization is  terminated or revoked. Performed at [facility], [place] [address], [place], [place] [address]   Culture, blood (routine x 2)     Status: None   Collection Time: 04/12/19  9:16 PM   Specimen: BLOOD RIGHT FOREARM  Result Value Ref Range Status   Specimen Description   Final    BLOOD RIGHT FOREARM Performed at [facility] [address] [place], [place] [address]    Special Requests   Final    BOTTLES DRAWN AEROBIC AND ANAEROBIC Blood Culture adequate volume Performed at [facility] [address] [place], [place] [address]    Culture   Final    NO GROWTH 5 DAYS Performed at [facility], [place] [address], [place], [place] [address]    Report Status 04/17/2019 FINAL  Final  Culture, blood (routine x 2)     Status: None   Collection Time: 04/12/19  9:16 PM   Specimen: BLOOD  Result Value Ref Range Status   Specimen Description   Final    BLOOD LEFT ANTECUBITAL Performed at [facility]  [address] [place], [place] [address]    Special Requests   Final    BOTTLES DRAWN AEROBIC AND ANAEROBIC Blood Culture adequate volume Performed at [facility] [address], [place], [place] [address]    Culture   Final    NO GROWTH 5 DAYS Performed at [facility], [place] [address], [place], [place] [address]    Report Status 04/17/2019 FINAL  Final  SARS CORONAVIRUS 2 (TAT 6-24 HRS) Nasopharyngeal Nasopharyngeal Swab     Status: None   Collection Time: 04/18/19  2:00 AM   Specimen: Nasopharyngeal Swab  Result Value Ref Range Status   SARS Coronavirus 2 NEGATIVE NEGATIVE Final    Comment: (NOTE) SARS-CoV-2 target nucleic acids are  NOT DETECTED. The SARS-CoV-2 RNA is generally detectable in upper and lower respiratory specimens during the acute phase of infection. Negative results do not preclude SARS-CoV-2 infection, do not rule out co-infections with other pathogens, and should not be used as the sole basis for treatment or other patient management decisions. Negative results must be combined with clinical observations, patient history, and epidemiological information. The expected result is Negative. Fact Sheet for Patients: SugarRoll.be Fact Sheet for Healthcare Providers: https://www.woods-mathews.com/ This test is not yet approved or cleared by the [place] FDA and  has been authorized for detection and/or diagnosis of SARS-CoV-2 by FDA under an Emergency Use Authorization (EUA). This EUA will remain  in effect (meaning this test can be used) for the duration of the COVID-19 declaration under Section 56 4(b)(1) of the Act, 21 U.S.C. section 360bbb-3(b)(1), unless the authorization is terminated or revoked sooner. Performed at [facility], [place] [address], [place], [place] [address]   Blood Culture (routine x 2)     Status: None (Preliminary result)   Collection Time: 04/18/19  4:04 AM    Specimen: BLOOD  Result Value Ref Range Status   Specimen Description BLOOD RIGHT ARM  Final   Special Requests   Final    BOTTLES DRAWN AEROBIC AND ANAEROBIC Blood Culture adequate volume   Culture   Final    NO GROWTH 1 DAY Performed at [facility], [place] [address] [place], [place] [address]    Report Status PENDING  Incomplete  Blood Culture (routine x 2)     Status: None (Preliminary result)   Collection Time: 04/18/19  5:40 AM   Specimen: BLOOD  Result Value Ref Range Status   Specimen Description BLOOD SITE NOT SPECIFIED  Final   Special Requests   Final    BOTTLES DRAWN AEROBIC AND ANAEROBIC Blood Culture results may not be optimal due to an inadequate volume of blood received in culture bottles   Culture   Final    NO GROWTH 1 DAY Performed at [facility], [place] [address] [place], [place] [address]    Report Status PENDING  Incomplete      Radiology Studies: IR Fluoro Guide CV Line Right  Result Date: 04/19/2019 INDICATION: 39 year old with end-stage renal disease. The patient has a peritoneal dialysis catheter but needs urgent hemodialysis. Request for hemodialysis catheter. EXAM: FLUOROSCOPIC AND ULTRASOUND GUIDED PLACEMENT OF A NON-TUNNELED DIALYSIS CATHETER Physician: Stephan Minister. Henn, MD MEDICATIONS: None ANESTHESIA/SEDATION: None FLUOROSCOPY TIME:  Fluoroscopy Time: 6 seconds, 2 mGy COMPLICATIONS: None immediate. PROCEDURE: The procedure was explained to the patient. The risks and benefits of the procedure were discussed and the patient's questions were addressed. Informed consent was obtained from the patient. The patient was placed supine on the interventional table. Ultrasound confirmed a patent right internal jugular vein. Ultrasound images were obtained for documentation. The right neck was prepped and draped in a sterile fashion. The right neck was anesthetized with 1% lidocaine. Maximal barrier sterile technique was utilized including caps, mask, sterile  gowns, sterile gloves, sterile drape, hand hygiene and skin antiseptic. A small incision was made with #11 blade scalpel. A 21 gauge needle directed into the right internal jugular vein with ultrasound guidance. A micropuncture dilator set was placed. A 16 cm Mahurkar catheter was selected. The catheter was advanced over a wire and positioned at the superior cavoatrial junction. Fluoroscopic images were obtained for documentation. Both dialysis lumens were found to aspirate and flush well. The proper  amount of heparin was flushed in both lumens. The central venous lumen was flushed with normal saline. Catheter was sutured to skin. FINDINGS: Catheter tip at the superior cavoatrial junction. IMPRESSION: Successful placement of a right jugular non-tunneled dialysis catheter using ultrasound and fluoroscopic guidance. Electronically Signed   By: Markus Daft M.D.   On: 04/19/2019 15:52   IR US Guide Vasc Access Right  Result Date: 04/19/2019 INDICATION: 39 year old with end-stage renal disease. The patient has a peritoneal dialysis catheter but needs urgent hemodialysis. Request for hemodialysis catheter. EXAM: FLUOROSCOPIC AND ULTRASOUND GUIDED PLACEMENT OF A NON-TUNNELED DIALYSIS CATHETER Physician: Stephan Minister. Henn, MD MEDICATIONS: None ANESTHESIA/SEDATION: None FLUOROSCOPY TIME:  Fluoroscopy Time: 6 seconds, 2 mGy COMPLICATIONS: None immediate. PROCEDURE: The procedure was explained to the patient. The risks and benefits of the procedure were discussed and the patient's questions were addressed. Informed consent was obtained from the patient. The patient was placed supine on the interventional table. Ultrasound confirmed a patent right internal jugular vein. Ultrasound images were obtained for documentation. The right neck was prepped and draped in a sterile fashion. The right neck was anesthetized with 1% lidocaine. Maximal barrier sterile technique was utilized including caps, mask, sterile gowns, sterile gloves,  sterile drape, hand hygiene and skin antiseptic. A small incision was made with #11 blade scalpel. A 21 gauge needle directed into the right internal jugular vein with ultrasound guidance. A micropuncture dilator set was placed. A 16 cm Mahurkar catheter was selected. The catheter was advanced over a wire and positioned at the superior cavoatrial junction. Fluoroscopic images were obtained for documentation. Both dialysis lumens were found to aspirate and flush well. The proper amount of heparin was flushed in both lumens. The central venous lumen was flushed with normal saline. Catheter was sutured to skin. FINDINGS: Catheter tip at the superior cavoatrial junction. IMPRESSION: Successful placement of a right jugular non-tunneled dialysis catheter using ultrasound and fluoroscopic guidance. Electronically Signed   By: Markus Daft M.D.   On: 04/19/2019 15:52   ECHOCARDIOGRAM COMPLETE  Result Date: 04/19/2019    ECHOCARDIOGRAM REPORT   Patient Name:   SYBELLA LASSITER Date of Exam: 04/19/2019 Medical Rec #:  [id number]   Height:       64.0 in Accession #:    [id number]  Weight:       167.2 lb Date of Birth:  11/03/1980  BSA:          1.81 m Patient Age:    48 years    BP:           165/106 mmHg Patient Gender: F           HR:           105 bpm. Exam Location:  Inpatient Procedure: 2D Echo, Cardiac Doppler and Color Doppler Indications:    Pericardial effusion 423.9  History:        Patient has prior history of Echocardiogram examinations, most                 recent 04/13/2019. Signs/Symptoms:Chest Pain; Risk                 Factors:Hypertension and Current Smoker. Pericardial effusion.                 Lupus.  Sonographer:    Vickie Epley RDCS Referring Phys: 69 [place]  1. No evidence of tamponade IVC is dilated but collapses with inspiration No MV inflow change with respiration but there are  changes with TV inflow.  2. Left ventricular ejection fraction, by estimation, is 60 to 65%. The left  ventricle has normal function. The left ventricle has no regional wall motion abnormalities. Left ventricular diastolic parameters were normal.  3. Right ventricular systolic function is normal. The right ventricular size is normal. There is normal pulmonary artery systolic pressure.  4. Left atrial size was moderately dilated.  5. Effusion seems slightly smaller than 04/13/19 not quite circumferential and no convincing evidence of tamponade . The pericardial effusion is posterior to the left ventricle , lateral to the left ventricle and posterior to the left ventricle and the left atrium.  6. The mitral valve is normal in structure and function. Trivial mitral valve regurgitation. No evidence of mitral stenosis.  7. The aortic valve is normal in structure and function. Aortic valve regurgitation is not visualized. No aortic stenosis is present. FINDINGS  Left Ventricle: Left ventricular ejection fraction, by estimation, is 60 to 65%. The left ventricle has normal function. The left ventricle has no regional wall motion abnormalities. The left ventricular internal cavity size was normal in size. There is  no left ventricular hypertrophy. Left ventricular diastolic parameters were normal. Right Ventricle: The right ventricular size is normal. No increase in right ventricular wall thickness. Right ventricular systolic function is normal. There is normal pulmonary artery systolic pressure. The tricuspid regurgitant velocity is 2.06 m/s, and  with an assumed right atrial pressure of 3 mmHg, the estimated right ventricular systolic pressure is 0000000 mmHg. Left Atrium: Left atrial size was moderately dilated. Right Atrium: Right atrial size was normal in size. Pericardium: Effusion seems slightly smaller than 04/13/19 not quite circumferential and no convincing evidence of tamponade. A small pericardial effusion is present. The pericardial effusion is posterior to the left ventricle , lateral to the left ventricle and  posterior to the left ventricle and the left atrium. Mitral Valve: The mitral valve is normal in structure and function. There is mild thickening of the mitral valve leaflet(s). Normal mobility of the mitral valve leaflets. Trivial mitral valve regurgitation. No evidence of mitral valve stenosis. Tricuspid Valve: The tricuspid valve is normal in structure. Tricuspid valve regurgitation is not demonstrated. No evidence of tricuspid stenosis. Aortic Valve: The aortic valve is normal in structure and function. Aortic valve regurgitation is not visualized. No aortic stenosis is present. Pulmonic Valve: The pulmonic valve was normal in structure. Pulmonic valve regurgitation is not visualized. No evidence of pulmonic stenosis. Aorta: The aortic root is normal in size and structure. IAS/Shunts: No atrial level shunt detected by color flow Doppler. Additional Comments: No evidence of tamponade IVC is dilated but collapses with inspiration No MV inflow change with respiration but there are changes with TV inflow.  LEFT VENTRICLE PLAX 2D LVIDd:         5.23 cm  Diastology LVIDs:         3.30 cm  LV e' lateral:   10.70 cm/s LV PW:         0.97 cm  LV E/e' lateral: 12.2 LV IVS:        0.97 cm  LV e' medial:    6.74 cm/s LVOT diam:     1.60 cm  LV E/e' medial:  19.4 LV SV:         57.91 ml LV SV Index:   46.51 LVOT Area:     2.01 cm  RIGHT VENTRICLE RV S prime:     14.90 cm/s TAPSE (M-mode): 2.4 cm LEFT  ATRIUM             Index       RIGHT ATRIUM           Index LA diam:        4.50 cm 2.48 cm/m  RA Area:     12.80 cm LA Vol (A2C):   53.6 ml 29.56 ml/m RA Volume:   32.40 ml  17.87 ml/m LA Vol (A4C):   86.7 ml 47.82 ml/m LA Biplane Vol: 71.3 ml 39.33 ml/m  AORTIC VALVE LVOT Vmax:   189.00 cm/s LVOT Vmean:  115.000 cm/s LVOT VTI:    0.288 m  AORTA Ao Root diam: 3.30 cm MITRAL VALVE                TRICUSPID VALVE MV Area (PHT): 4.96 cm     TR Peak grad:   17.0 mmHg MV Decel Time: 153 msec     TR Vmax:        206.00 cm/s MV  E velocity: 131.00 cm/s MV A velocity: 148.00 cm/s  SHUNTS MV E/A ratio:  0.89         Systemic VTI:  0.29 m                             Systemic Diam: 1.60 cm Jenkins Rouge MD Electronically signed by Jenkins Rouge MD Signature Date/Time: 04/19/2019/11:32:42 AM    Final    VAS [place] LOWER EXTREMITY VENOUS (DVT)  Result Date: 04/18/2019  Lower Venous DVTStudy Indications: Swelling.  Comparison Study: No prior study Performing Technologist: Maudry Mayhew MHA, RDMS, RVT, RDCS  Examination Guidelines: A complete evaluation includes B-mode imaging, spectral Doppler, color Doppler, and power Doppler as needed of all accessible portions of each vessel. Bilateral testing is considered an integral part of a complete examination. Limited examinations for reoccurring indications may be performed as noted. The reflux portion of the exam is performed with the patient in reverse Trendelenburg.  +---------+---------------+---------+-----------+----------+--------------+ RIGHT    CompressibilityPhasicitySpontaneityPropertiesThrombus Aging +---------+---------------+---------+-----------+----------+--------------+ CFV      Full           Yes      Yes                                 +---------+---------------+---------+-----------+----------+--------------+ SFJ      Full                                                        +---------+---------------+---------+-----------+----------+--------------+ FV Prox  Full                                                        +---------+---------------+---------+-----------+----------+--------------+ FV Mid   Full                                                        +---------+---------------+---------+-----------+----------+--------------+ FV DistalFull                                                        +---------+---------------+---------+-----------+----------+--------------+  PFV      Full                                                         +---------+---------------+---------+-----------+----------+--------------+ POP      Full           Yes      Yes                                 +---------+---------------+---------+-----------+----------+--------------+ PTV      Full                                                        +---------+---------------+---------+-----------+----------+--------------+ PERO     Full                                                        +---------+---------------+---------+-----------+----------+--------------+   +---------+---------------+---------+-----------+----------+--------------+ LEFT     CompressibilityPhasicitySpontaneityPropertiesThrombus Aging +---------+---------------+---------+-----------+----------+--------------+ CFV      Full           Yes      Yes                                 +---------+---------------+---------+-----------+----------+--------------+ SFJ      Full                                                        +---------+---------------+---------+-----------+----------+--------------+ FV Prox  Full                                                        +---------+---------------+---------+-----------+----------+--------------+ FV Mid   Full                                                        +---------+---------------+---------+-----------+----------+--------------+ FV DistalFull                                                        +---------+---------------+---------+-----------+----------+--------------+ PFV      Full                                                        +---------+---------------+---------+-----------+----------+--------------+  POP      Full           Yes      Yes                                 +---------+---------------+---------+-----------+----------+--------------+ PTV      Full                                                         +---------+---------------+---------+-----------+----------+--------------+ PERO     Full                                                        +---------+---------------+---------+-----------+----------+--------------+     Summary: RIGHT: - There is no evidence of deep vein thrombosis in the lower extremity.  - No cystic structure found in the popliteal fossa.  LEFT: - There is no evidence of deep vein thrombosis in the lower extremity.  - No cystic structure found in the popliteal fossa.  *See table(s) above for measurements and observations. Electronically signed by Servando Snare MD on 04/18/2019 at 6:33:11 PM.    Final     Scheduled Meds: . Chlorhexidine Gluconate Cloth  6 each Topical Daily  . darbepoetin (ARANESP) injection - DIALYSIS  40 mcg Intravenous Q Sat-HD  . furosemide  40 mg Intravenous BID  . gentamicin cream  1 application Topical Daily  . dianeal solution for CAPD/CCPD with heparin   Peritoneal Dialysis Once  . hydrALAZINE  100 mg Oral TID  . ipratropium-albuterol  3 mL Nebulization BID  . labetalol  300 mg Oral BID  . methylPREDNISolone (SOLU-MEDROL) injection  40 mg Intravenous BID  . NIFEdipine  60 mg Oral BID  . sodium chloride flush  3 mL Intravenous Once   Continuous Infusions: . ceFEPime (MAXIPIME) IV 1 g (04/19/19 1843)  . dialysis solution 2.5% low-MG       LOS: 1 day   Time spent: 35 minutes.  Benito Mccreedy, MD [facility] [email or web address] 04/20/2019, 2:05 PM

## 2019-04-21 ENCOUNTER — Inpatient Hospital Stay (HOSPITAL_COMMUNITY): Payer: Medicaid Other

## 2019-04-21 LAB — URINALYSIS, ROUTINE W REFLEX MICROSCOPIC
Bacteria, UA: NONE SEEN
Bilirubin Urine: NEGATIVE
Glucose, UA: 50 mg/dL — AB
Ketones, ur: NEGATIVE mg/dL
Leukocytes,Ua: NEGATIVE
Nitrite: NEGATIVE
Protein, ur: 100 mg/dL — AB
Specific Gravity, Urine: 1.01 (ref 1.005–1.030)
pH: 7 (ref 5.0–8.0)

## 2019-04-21 LAB — HEPARIN LEVEL (UNFRACTIONATED)
Heparin Unfractionated: <0.1 IU/mL — ABNORMAL LOW (ref 0.30–0.70)
Heparin Unfractionated: 0.35 IU/mL (ref 0.30–0.70)

## 2019-04-21 LAB — CBC
HCT: 26.2 % — ABNORMAL LOW (ref 36.0–46.0)
Hemoglobin: 8.5 g/dL — ABNORMAL LOW (ref 12.0–15.0)
MCH: 22.8 pg — ABNORMAL LOW (ref 26.0–34.0)
MCHC: 32.4 g/dL (ref 30.0–36.0)
MCV: 70.2 fL — ABNORMAL LOW (ref 80.0–100.0)
Platelets: 396 10*3/uL (ref 150–400)
RBC: 3.73 MIL/uL — ABNORMAL LOW (ref 3.87–5.11)
RDW: 16.9 % — ABNORMAL HIGH (ref 11.5–15.5)
WBC: 10.2 10*3/uL (ref 4.0–10.5)
nRBC: 0 % (ref 0.0–0.2)

## 2019-04-21 MED ORDER — HEPARIN (PORCINE) 25000 UT/250ML-% IV SOLN
1200.0000 [IU]/h | INTRAVENOUS | Status: DC
  Administered 2019-04-21 – 2019-04-22 (×2): 1150 [IU]/h via INTRAVENOUS
  Filled 2019-04-21 (×3): qty 250

## 2019-04-21 MED ORDER — HEPARIN BOLUS VIA INFUSION
4000.0000 [IU] | Freq: Once | INTRAVENOUS | Status: AC
  Administered 2019-04-21: 16:00:00 4000 [IU] via INTRAVENOUS
  Filled 2019-04-21: qty 4000

## 2019-04-21 MED ORDER — CLONIDINE HCL 0.2 MG PO TABS
0.2000 mg | ORAL_TABLET | Freq: Once | ORAL | Status: AC
  Administered 2019-04-21: 01:00:00 0.2 mg via ORAL
  Filled 2019-04-21: qty 1

## 2019-04-21 NOTE — Progress Notes (Signed)
Pharmacy Antibiotic Note  Latasha Drake is a 39 y.o. female admitted on 04/17/2019 with pneumonia. Recent admission for RLL pneumonia, discharged on augmentin 2/7. ESRD on PD still making urine. She is afebrile and WBC is wnl. Pharmacy has been consulted for Cefepime dosing.  Vanc 2/11>>2/11 Cefepime 2/11>>  2/13 Bcx: ngtd x2 days  Plan: Continue Cefepime 1g IV q24h  Height: 5\' 4"  (162.6 cm) Weight: 153 lb 10.6 oz (69.7 kg) IBW/kg (Calculated) : 54.7  Temp (24hrs), Avg:98.1 F (36.7 C), Min:98 F (36.7 C), Max:98.4 F (36.9 C)  Recent Labs  Lab 04/18/19 0010 04/18/19 0404 04/18/19 0543 04/19/19 0123 04/20/19 0650 04/21/19 0218  WBC 9.4  --   --  8.5 11.9* 10.2  CREATININE 9.14*  --   --  9.23* 6.32*  --   LATICACIDVEN  --  1.0 0.7  --   --   --     Estimated Creatinine Clearance: 11.3 mL/min (A) (by C-G formula based on SCr of 6.32 mg/dL (H)).    Allergies  Allergen Reactions  . Lisinopril Swelling    Acey Lav, PharmD  PGY1 Acute Care Pharmacy Resident 04/21/2019 7:20 AM

## 2019-04-21 NOTE — Progress Notes (Signed)
ANTICOAGULATION CONSULT NOTE - Follow Up Consult  Pharmacy Consult for Heparin  Indication: pulmonary embolus  Allergies  Allergen Reactions  . Lisinopril Swelling   Weight: 76.1 kg Height: 64 inches Heparin Dosing Weight: 68.8 kg  Vital Signs: Temp: 99.1 F (37.3 C) (02/14 2336) Temp Source: Oral (02/14 2336) BP: 162/104 (02/14 2336) Pulse Rate: 80 (02/14 2336)  Labs: Recent Labs    04/19/19 0123 04/19/19 0123 04/20/19 0650 04/21/19 0218 04/21/19 2326  HGB 8.3*   < > 8.0* 8.5*  --   HCT 25.6*  --  24.4* 26.2*  --   PLT 454*  --  478* 396  --   HEPARINUNFRC 0.59   < > 0.59 <0.10* 0.35  CREATININE 9.23*  --  6.32*  --   --    < > = values in this interval not displayed.   Estimated Creatinine Clearance: 11.3 mL/min (A) (by C-G formula based on SCr of 6.32 mg/dL (H)).  Medical History: Past Medical History:  Diagnosis Date  . Anemia   . Chronic kidney disease   . Hypertension   . Lupus ([place])   . Pericardial effusion 04/13/2019   circumferential  . Pneumonia 04/12/2019  . Pulmonary hypertension ([place]) 04/13/2019   moderate   Assessment: 39 yr old female with rib pain, D-dimer is elevated at 13.31. Pt was here last week and had a D-dimer of 16 and intermediate probability of PE on VQ scan and was not started on any anticoagulation. 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 Per med rec, pt was not on anticoagulation PTA.  On 2/13 at 1340 heparin was D/c per MD. Heparin level today after discontinuation was <0.10. Patient was previously therapeutic on heparin gtt 1150 units/hr. Today (2/14), pharmacy consulted to restart heparin gtt for possible PE.   Hbg 8.5 (remains low but stable), plts wnl, no concerns for bleeding noted in chart.   2/14 PM update:  Heparin level therapeutic x 1 after re-starting this evening  Goal of Therapy:  Heparin level 0.3-0.7 units/ml Monitor platelets by anticoagulation protocol: Yes   Plan:  Cont heparin at 1150  units/hr Confirmatory heparin level with AM labs  Narda Bonds, PharmD, [place] Pharmacist Phone: [phone]

## 2019-04-21 NOTE — Progress Notes (Signed)
Latasha Drake Progress Note   Subjective:  Breathing improving with dialysis. Had another 2L removed yesterday. Has continued R -sided pleuritic type pain.   Objective Vitals:   04/21/19 0232 04/21/19 0616 04/21/19 0700 04/21/19 0849  BP: (!) 178/104 (!) 180/110  (!) 160/107  Pulse:  89 88 94  Resp:  15 16 16   Temp:  98 F (36.7 C)    TempSrc:  Oral    SpO2:  96% 98%   Weight:  69.7 kg    Height:        Weight change: -4.3 kg   Additional Objective Labs: Basic Metabolic Panel: Recent Labs  Lab 04/18/19 0010 04/19/19 0123 04/20/19 0650  NA 137 135 138  K 3.3* 3.7 3.8  CL 98 97* 102  CO2 22 20* 22  GLUCOSE 109* 145* 140*  BUN 70* 70* 46*  CREATININE 9.14* 9.23* 6.32*  CALCIUM 8.0* 8.5* 8.3*   CBC: Recent Labs  Lab 04/18/19 0010 04/18/19 0010 04/19/19 0123 04/20/19 0650 04/21/19 0218  WBC 9.4   < > 8.5 11.9* 10.2  HGB 8.3*   < > 8.3* 8.0* 8.5*  HCT 25.3*   < > 25.6* 24.4* 26.2*  MCV 69.5*  --  69.9* 69.9* 70.2*  PLT 463*   < > 454* 478* 396   < > = values in this interval not displayed.   Blood Culture    Component Value Date/Time   SDES BLOOD SITE NOT SPECIFIED 04/18/2019 0540   SPECREQUEST  04/18/2019 0540    BOTTLES DRAWN AEROBIC AND ANAEROBIC Blood Culture results may not be optimal due to an inadequate volume of blood received in culture bottles   CULT  04/18/2019 0540    NO GROWTH 2 DAYS Performed at [facility], [place] [address] [place], [place] [address]    REPTSTATUS PENDING 04/18/2019 0540     Physical Exam General: Young female, alert, nad  Heart: Regular, No m,r,g  Lungs: Decreased in R base; No wheezing today  Abdomen: soft; non-tender  Extremities: No sig LE edema  Dialysis Access: PD cath in place  Medications: . ceFEPime (MAXIPIME) IV 1 g (04/20/19 1729)  . dialysis solution 2.5% low-MG     . Chlorhexidine Gluconate Cloth  6 each Topical Daily  . darbepoetin (ARANESP) injection - DIALYSIS  40 mcg  Intravenous Q Sat-HD  . furosemide  40 mg Intravenous BID  . gentamicin cream  1 application Topical Daily  . dianeal solution for CAPD/CCPD with heparin   Peritoneal Dialysis Once  . hydrALAZINE  100 mg Oral TID  . ipratropium-albuterol  3 mL Nebulization BID  . labetalol  300 mg Oral BID  . methylPREDNISolone (SOLU-MEDROL) injection  40 mg Intravenous BID  . NIFEdipine  60 mg Oral BID  . sodium chloride flush  3 mL Intravenous Once    Dialysis Orders: CAPD Followed by Dr. Neta Ehlers @ [facility]  Assessment/Plan: 1. Dyspnea/Pleurtic CP. Recent treatment for PNA. Repeat CXR 2/11 showing unchanged RLL consolidation, small right pleural effusion. Intermediate probability for PE per nuclear scan though seems less likely as dyspnea significantly improved with dialysis.  On antibiotics per primary. Continued right sided pain with inspiration - may need thoracentesis.  2. ESRD - CAPD started about 2 mos ago. 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 Temp cath placed 2/12 for HD. Had HD 2/12, 2/13. Will ask IR to convert to [facility] as she may do better with HD  in the long-term. She is agreeable. Plan HD 2/15 after TDC.  3. HTN/volume- BP remains elevated. On labetalol, Procardia, Hydralazine. So far has had 5L removed on HD. Continue UF as tolerated.  4. Anemia- Aranesp 40 with HD on 2/13  Tsat 36%  5. MBD of CKD -  Continue home binders/Vit D  6. Nutrition - Renal diet/fluid restrictions   Lynnda Child PA-C [facility] Pager [phone] 04/21/2019,9:17 AM  LOS: 2 days

## 2019-04-21 NOTE — Progress Notes (Signed)
ANTICOAGULATION CONSULT NOTE - Follow Up Consult  Pharmacy Consult for Heparin  Indication: pulmonary embolus  Allergies  Allergen Reactions  . Lisinopril Swelling   Weight: 76.1 kg Height: 64 inches Heparin Dosing Weight: 68.8 kg  Vital Signs: Temp: 98 F (36.7 C) (02/14 1205) Temp Source: Oral (02/14 1205) BP: 151/101 (02/14 1205) Pulse Rate: 82 (02/14 1205)  Labs: Recent Labs    04/19/19 0123 04/19/19 0123 04/20/19 0650 04/21/19 0218  HGB 8.3*   < > 8.0* 8.5*  HCT 25.6*  --  24.4* 26.2*  PLT 454*  --  478* 396  HEPARINUNFRC 0.59  --  0.59 <0.10*  CREATININE 9.23*  --  6.32*  --    < > = values in this interval not displayed.   Estimated Creatinine Clearance: 11.3 mL/min (A) (by C-G formula based on SCr of 6.32 mg/dL (H)).  Medical History: Past Medical History:  Diagnosis Date  . Anemia   . Chronic kidney disease   . Hypertension   . Lupus ([place])   . Pericardial effusion 04/13/2019   circumferential  . Pneumonia 04/12/2019  . Pulmonary hypertension ([place]) 04/13/2019   moderate   Assessment: 39 yr old female with rib pain, D-dimer is elevated at 13.31. Pt was here last week and had a D-dimer of 16 and intermediate probability of PE on VQ scan and was not started on any anticoagulation. 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 Per med rec, pt was not on anticoagulation PTA.  On 2/13 at 1340 heparin was D/c per MD. Heparin level today after discontinuation was <0.10. Patient was previously therapeutic on heparin gtt 1150 units/hr. Today (2/14), pharmacy consulted to restart heparin gtt for possible PE.   Hbg 8.5 (remains low but stable), plts wnl, no concerns for bleeding noted in chart.   Goal of Therapy:  Heparin level 0.3-0.7 units/ml Monitor platelets by anticoagulation protocol: Yes   Plan:  Give heparin bolus of 4000 units x 1 dose; then start heparin infusion at 1150 units/hr Monitor 8hr heparin level  Monitor daily heparin level  andCBC Monitor for signs/symptoms of bleeding  Acey Lav, PharmD  PGY1 Acute Care Pharmacy Resident 04/20/2019 11:32 AM

## 2019-04-21 NOTE — Progress Notes (Signed)
PROGRESS NOTE  Latasha Drake  [id number] DOB: Sep 06, 1980 DOA: 04/17/2019 PCP: Katherina Mires, MD   Brief Narrative: Latasha Drake is a 39 y.o. female with a history of SLE, ESRD on PD still making urine, recently chronic prednisone use, AOCD, and recent admission for RLL pneumonia discharged on augmentin 2/7 only to return to the ED 2/11 with dyspnea and pleuritic chest pain. At that admission, d-dimer was elevated and V/Q scan was intermediate probability for PE, though no anticoagulation started. On return today she had no significant change on CXR showing significant RLL consolidation and d-dimer 13.31. She was started on IV heparin empirically with steroids/antibiotic. Nephrology consulted for PD and pulmonology consulted for continued respiratory complaints Assessment & Plan: Principal Problem:   Chest pain Active Problems:   Lupus ([place])   Hypertension   Symptomatic anemia   Pneumonia   ESRD (end stage renal disease) ([place])   Acute respiratory failure with hypoxia ([facility])  Acute hypoxemic respiratory failure:  -Likely due to Fluid Overload -Repeat echocardiogram 04/19/2019-negative for tamponade -CXR noted persistent unchanged right lower lobe infiltrate -CT on 04/12/2019 had indicated moderate to severe right lower lobe consolidation with bilateral pleural effusion, small pericardial effusion  -COVID-19 was negative -Patient continued antibiotics with IV heparin for probable PE on account of recent indeterminate V/Q report -Pulmonary critical care consulted-respiratory problems felt to be 2/2 splinting / alveolar hypoventilation from pleuritic chest pain, and felt that there was a element of edema chest x-ray and recommended diuresis.  Pulmonary also agreed with anticoagulation with antibiotic, and supportive care. - Pt underwent HD 04/19/2019 with 2.9 L fluid removal with significant improvement in SOB, off O2, with resolution of chest pains. - Heparin stopped - fluid overload felt to be  cause of presentation -however had recurrence of nonspecific chest pain and after discussions of nephrology referral appropriate to resume heparin which was stopped on 04/20/2019.    SLE, history of lupus nephritis and ESRD on PD:  - Nephrology consulted, will oversee dialysis  - Continue prednisone 20mg  - Avoid NSAIDs in this renal patient if possible.  HTN: Uncontrolled, argues against tamponade.  -  increased hydralazine 50mg  > 100mg  TID - Continue labetalol 300mg  BID - Continue nifedipine 60mg  BID  Anemia of chronic disease:  Possibly contributing to symptoms. Denies bleeding, has required transfusions in the past.  - Monitor daily. - Check iron studies, has reported hx chronic blood loss anemia.  DVT prophylaxis: Heparin  Code Status: Full Family Communication: None at bedside Disposition Plan: Consultants:   Nephrology  Pulmonary  Procedures:   Echocardiogram   Antimicrobials:  Vancomycin 2/10  Cefepime 2/11 >>    Subjective: No more chest pains, breathing better, but complains of right sided low back pain, moderate, aching intensity, nonradiating  Objective: Vitals:   04/21/19 0616 04/21/19 0700 04/21/19 0849 04/21/19 1205  BP: (!) 180/110  (!) 160/107 (!) 151/101  Pulse: 89 88 94 82  Resp: 15 16 16 18   Temp: 98 F (36.7 C)   98 F (36.7 C)  TempSrc: Oral   Oral  SpO2: 96% 98%  97%  Weight: 69.7 kg     Height:        Intake/Output Summary (Last 24 hours) at 04/21/2019 1351 Last data filed at 04/21/2019 0732 Gross per 24 hour  Intake 683.67 ml  Output 2100 ml  Net -1416.33 ml   Filed Weights   04/20/19 1332 04/20/19 1641 04/21/19 0616  Weight: 72.5 kg 69.9 kg 69.7 kg   Gen:  39 y.o. female in no distress at rest Pulm:  Diminished with some mild wheezing.  CV: Regular rate and rhythm. No murmur, rub, or gallop. No JVD, no pitting pedal edema. GI: Abdomen soft, ptouberant without tenderness or distention. PD cath site c/d/i. +BS. Ext: Warm, no  deformities Skin: No rashes, lesions or ulcers Neuro: Alert and oriented. No focal neurological deficits. Psych: Judgement and insight appear normal. Mood & affect appropriate.  Back: Mild tenderness right paraspinal area without spasms, straight leg raise negative Data Reviewed: I have personally reviewed following labs and imaging studies  CBC: Recent Labs  Lab 04/18/19 0010 04/19/19 0123 04/20/19 0650 04/21/19 0218  WBC 9.4 8.5 11.9* 10.2  HGB 8.3* 8.3* 8.0* 8.5*  HCT 25.3* 25.6* 24.4* 26.2*  MCV 69.5* 69.9* 69.9* 70.2*  PLT 463* 454* 478* [id number]   Basic Metabolic Panel: Recent Labs  Lab 04/18/19 0010 04/19/19 0123 04/20/19 0650  NA 137 135 138  K 3.3* 3.7 3.8  CL 98 97* 102  CO2 22 20* 22  GLUCOSE 109* 145* 140*  BUN 70* 70* 46*  CREATININE 9.14* 9.23* 6.32*  CALCIUM 8.0* 8.5* 8.3*   GFR: Estimated Creatinine Clearance: 11.3 mL/min (A) (by C-G formula based on SCr of 6.32 mg/dL (H)). Liver Function Tests: Recent Labs  Lab 04/19/19 0123  AST 14*  ALT 11  ALKPHOS 81  BILITOT 0.4  PROT 6.8  ALBUMIN 2.9*   No results for input(s): LIPASE, AMYLASE in the last 168 hours. No results for input(s): AMMONIA in the last 168 hours. Coagulation Profile: Recent Labs  Lab 04/18/19 0010  INR 1.0   Cardiac Enzymes: No results for input(s): CKTOTAL, CKMB, CKMBINDEX, TROPONINI in the last 168 hours. BNP (last 3 results) No results for input(s): PROBNP in the last 8760 hours. HbA1C: No results for input(s): HGBA1C in the last 72 hours. CBG: No results for input(s): GLUCAP in the last 168 hours. Lipid Profile: No results for input(s): CHOL, HDL, LDLCALC, TRIG, CHOLHDL, LDLDIRECT in the last 72 hours. Thyroid Function Tests: No results for input(s): TSH, T4TOTAL, FREET4, T3FREE, THYROIDAB in the last 72 hours. Anemia Panel: Recent Labs    04/20/19 0650  VITAMINB12 270  FOLATE 4.4*  FERRITIN 242  TIBC 238*  IRON 85  RETICCTPCT 1.9   Urine analysis:      Component Value Date/Time   COLORURINE STRAW (A) 04/20/2019 2333   APPEARANCEUR CLEAR 04/20/2019 2333   LABSPEC 1.010 04/20/2019 2333   PHURINE 7.0 04/20/2019 2333   GLUCOSEU 50 (A) 04/20/2019 2333   HGBUR SMALL (A) 04/20/2019 2333   BILIRUBINUR NEGATIVE 04/20/2019 2333   [place] 04/20/2019 2333   PROTEINUR 100 (A) 04/20/2019 2333   NITRITE NEGATIVE 04/20/2019 2333   LEUKOCYTESUR NEGATIVE 04/20/2019 2333   Recent Results (from the past 240 hour(s))  Respiratory Panel by RT PCR (Flu A&B, Covid) - Nasopharyngeal Swab     Status: None   Collection Time: 04/12/19  9:16 PM   Specimen: Nasopharyngeal Swab  Result Value Ref Range Status   SARS Coronavirus 2 by RT PCR NEGATIVE NEGATIVE Final    Comment: (NOTE) SARS-CoV-2 target nucleic acids are NOT DETECTED. The SARS-CoV-2 RNA is generally detectable in upper respiratoy specimens during the acute phase of infection. The lowest concentration of SARS-CoV-2 viral copies this assay can detect is 131 copies/mL. A negative result does not preclude SARS-Cov-2 infection and should not be used as the sole basis for treatment or other patient management decisions. A negative result may  occur with  improper specimen collection/handling, submission of specimen other than nasopharyngeal swab, presence of viral mutation(s) within the areas targeted by this assay, and inadequate number of viral copies (<131 copies/mL). A negative result must be combined with clinical observations, patient history, and epidemiological information. The expected result is Negative. Fact Sheet for Patients:  PinkCheek.be Fact Sheet for Healthcare Providers:  GravelBags.it This test is not yet ap proved or cleared by the [place] FDA and  has been authorized for detection and/or diagnosis of SARS-CoV-2 by FDA under an Emergency Use Authorization (EUA). This EUA will remain  in effect (meaning  this test can be used) for the duration of the COVID-19 declaration under Section 564(b)(1) of the Act, 21 U.S.C. section 360bbb-3(b)(1), unless the authorization is terminated or revoked sooner.    Influenza A by PCR NEGATIVE NEGATIVE Final   Influenza B by PCR NEGATIVE NEGATIVE Final    Comment: (NOTE) The Xpert Xpress SARS-CoV-2/FLU/RSV assay is intended as an aid in  the diagnosis of influenza from Nasopharyngeal swab specimens and  should not be used as a sole basis for treatment. Nasal washings and  aspirates are unacceptable for Xpert Xpress SARS-CoV-2/FLU/RSV  testing. Fact Sheet for Patients: PinkCheek.be Fact Sheet for Healthcare Providers: GravelBags.it This test is not yet approved or cleared by the [place] FDA and  has been authorized for detection and/or diagnosis of SARS-CoV-2 by  FDA under an Emergency Use Authorization (EUA). This EUA will remain  in effect (meaning this test can be used) for the duration of the  Covid-19 declaration under Section 564(b)(1) of the Act, 21  U.S.C. section 360bbb-3(b)(1), unless the authorization is  terminated or revoked. Performed at [facility], [place] [address] [place], [place] [address]   Culture, blood (routine x 2)     Status: None   Collection Time: 04/12/19  9:16 PM   Specimen: BLOOD RIGHT FOREARM  Result Value Ref Range Status   Specimen Description   Final    BLOOD RIGHT FOREARM Performed at [place] [address] [place], [place] [address]    Special Requests   Final    BOTTLES DRAWN AEROBIC AND ANAEROBIC Blood Culture adequate volume Performed at [place] [address], [place], [place] [address]    Culture   Final    NO GROWTH 5 DAYS Performed at [facility], [place] [address], [place], [place] [address]    Report Status 04/17/2019 FINAL  Final  Culture, blood (routine x 2)      Status: None   Collection Time: 04/12/19  9:16 PM   Specimen: BLOOD  Result Value Ref Range Status   Specimen Description   Final    BLOOD LEFT ANTECUBITAL Performed at [place] [address], [place], [place] [address]    Special Requests   Final    BOTTLES DRAWN AEROBIC AND ANAEROBIC Blood Culture adequate volume Performed at [place] [address], [place], [place] [address]    Culture   Final    NO GROWTH 5 DAYS Performed at [facility], [place] [address] [place], [place] [address]    Report Status 04/17/2019 FINAL  Final  SARS CORONAVIRUS 2 (TAT 6-24 HRS) Nasopharyngeal Nasopharyngeal Swab     Status: None   Collection Time: 04/18/19  2:00 AM   Specimen: Nasopharyngeal Swab  Result Value Ref Range Status   SARS Coronavirus 2 NEGATIVE NEGATIVE Final  Comment: (NOTE) SARS-CoV-2 target nucleic acids are NOT DETECTED. The SARS-CoV-2 RNA is generally detectable in upper and lower respiratory specimens during the acute phase of infection. Negative results do not preclude SARS-CoV-2 infection, do not rule out co-infections with other pathogens, and should not be used as the sole basis for treatment or other patient management decisions. Negative results must be combined with clinical observations, patient history, and epidemiological information. The expected result is Negative. Fact Sheet for Patients: SugarRoll.be Fact Sheet for Healthcare Providers: https://www.woods-mathews.com/ This test is not yet approved or cleared by the [place] FDA and  has been authorized for detection and/or diagnosis of SARS-CoV-2 by FDA under an Emergency Use Authorization (EUA). This EUA will remain  in effect (meaning this test can be used) for the duration of the COVID-19 declaration under Section 56 4(b)(1) of the Act, 21 U.S.C. section 360bbb-3(b)(1), unless the authorization is terminated  or revoked sooner. Performed at [facility], [place] [address], [place], [place] [address]   Blood Culture (routine x 2)     Status: None (Preliminary result)   Collection Time: 04/18/19  4:04 AM   Specimen: BLOOD  Result Value Ref Range Status   Specimen Description BLOOD RIGHT ARM  Final   Special Requests   Final    BOTTLES DRAWN AEROBIC AND ANAEROBIC Blood Culture adequate volume   Culture   Final    NO GROWTH 2 DAYS Performed at [facility], [place] [address], [place], [place] [address]    Report Status PENDING  Incomplete  Blood Culture (routine x 2)     Status: None (Preliminary result)   Collection Time: 04/18/19  5:40 AM   Specimen: BLOOD  Result Value Ref Range Status   Specimen Description BLOOD SITE NOT SPECIFIED  Final   Special Requests   Final    BOTTLES DRAWN AEROBIC AND ANAEROBIC Blood Culture results may not be optimal due to an inadequate volume of blood received in culture bottles   Culture   Final    NO GROWTH 2 DAYS Performed at [facility], [place] [address], [place], [place] [address]    Report Status PENDING  Incomplete      Radiology Studies: No results found.  Scheduled Meds: . Chlorhexidine Gluconate Cloth  6 each Topical Daily  . darbepoetin (ARANESP) injection - DIALYSIS  40 mcg Intravenous Q Sat-HD  . hydrALAZINE  100 mg Oral TID  . ipratropium-albuterol  3 mL Nebulization BID  . labetalol  300 mg Oral BID  . methylPREDNISolone (SOLU-MEDROL) injection  40 mg Intravenous BID  . NIFEdipine  60 mg Oral BID  . sodium chloride flush  3 mL Intravenous Once   Continuous Infusions: . ceFEPime (MAXIPIME) IV 1 g (04/20/19 1729)     LOS: 2 days   Time spent: 25 minutes.  Benito Mccreedy, MD [facility] [email or web address] 04/21/2019, 1:51 PM

## 2019-04-22 ENCOUNTER — Inpatient Hospital Stay (HOSPITAL_COMMUNITY): Payer: Medicaid Other

## 2019-04-22 DIAGNOSIS — Z9289 Personal history of other medical treatment: Secondary | ICD-10-CM

## 2019-04-22 DIAGNOSIS — J90 Pleural effusion, not elsewhere classified: Secondary | ICD-10-CM

## 2019-04-22 DIAGNOSIS — R0781 Pleurodynia: Secondary | ICD-10-CM

## 2019-04-22 HISTORY — PX: IR US GUIDE VASC ACCESS RIGHT: IMG2390

## 2019-04-22 HISTORY — PX: IR FLUORO GUIDE CV LINE RIGHT: IMG2283

## 2019-04-22 LAB — CBC
HCT: 27 % — ABNORMAL LOW (ref 36.0–46.0)
Hemoglobin: 8.8 g/dL — ABNORMAL LOW (ref 12.0–15.0)
MCH: 23 pg — ABNORMAL LOW (ref 26.0–34.0)
MCHC: 32.6 g/dL (ref 30.0–36.0)
MCV: 70.5 fL — ABNORMAL LOW (ref 80.0–100.0)
Platelets: 367 10*3/uL (ref 150–400)
RBC: 3.83 MIL/uL — ABNORMAL LOW (ref 3.87–5.11)
RDW: 17.2 % — ABNORMAL HIGH (ref 11.5–15.5)
WBC: 11.5 10*3/uL — ABNORMAL HIGH (ref 4.0–10.5)
nRBC: 0 % (ref 0.0–0.2)

## 2019-04-22 LAB — BASIC METABOLIC PANEL
Anion gap: 16 — ABNORMAL HIGH (ref 5–15)
BUN: 49 mg/dL — ABNORMAL HIGH (ref 6–20)
CO2: 24 mmol/L (ref 22–32)
Calcium: 8.6 mg/dL — ABNORMAL LOW (ref 8.9–10.3)
Chloride: 97 mmol/L — ABNORMAL LOW (ref 98–111)
Creatinine, Ser: 6.09 mg/dL — ABNORMAL HIGH (ref 0.44–1.00)
GFR calc Af Amer: 9 mL/min — ABNORMAL LOW (ref >60)
GFR calc non Af Amer: 8 mL/min — ABNORMAL LOW (ref >60)
Glucose, Bld: 129 mg/dL — ABNORMAL HIGH (ref 70–99)
Potassium: 4.3 mmol/L (ref 3.5–5.1)
Sodium: 137 mmol/L (ref 135–145)

## 2019-04-22 LAB — HEPARIN LEVEL (UNFRACTIONATED): Heparin Unfractionated: 0.29 IU/mL — ABNORMAL LOW (ref 0.30–0.70)

## 2019-04-22 MED ORDER — GELATIN ABSORBABLE 12-7 MM EX MISC
CUTANEOUS | Status: AC
  Filled 2019-04-22: qty 1

## 2019-04-22 MED ORDER — HYDROMORPHONE HCL 1 MG/ML IJ SOLN
INTRAMUSCULAR | Status: AC
  Filled 2019-04-22: qty 0.5

## 2019-04-22 MED ORDER — FENTANYL CITRATE (PF) 100 MCG/2ML IJ SOLN
INTRAMUSCULAR | Status: AC
  Filled 2019-04-22: qty 2

## 2019-04-22 MED ORDER — CHLORHEXIDINE GLUCONATE 4 % EX LIQD
CUTANEOUS | Status: AC
  Filled 2019-04-22: qty 15

## 2019-04-22 MED ORDER — HEPARIN SODIUM (PORCINE) 1000 UNIT/ML IJ SOLN
INTRAMUSCULAR | Status: AC
  Filled 2019-04-22: qty 1

## 2019-04-22 MED ORDER — LIDOCAINE HCL (PF) 1 % IJ SOLN: 5.0000 mL | INTRAMUSCULAR | Status: DC | PRN

## 2019-04-22 MED ORDER — PREDNISONE 20 MG PO TABS
20.0000 mg | ORAL_TABLET | Freq: Every day | ORAL | Status: DC
  Administered 2019-04-23 – 2019-04-26 (×4): 20 mg via ORAL
  Filled 2019-04-22 (×4): qty 1

## 2019-04-22 MED ORDER — MIDAZOLAM HCL 2 MG/2ML IJ SOLN
INTRAMUSCULAR | Status: AC
  Filled 2019-04-22: qty 2

## 2019-04-22 MED ORDER — SODIUM CHLORIDE 0.9 % IV SOLN: 100.0000 mL | INTRAVENOUS | Status: DC | PRN

## 2019-04-22 MED ORDER — FENTANYL CITRATE (PF) 100 MCG/2ML IJ SOLN
INTRAMUSCULAR | Status: AC | PRN
  Administered 2019-04-22: 50 ug via INTRAVENOUS

## 2019-04-22 MED ORDER — HEPARIN SODIUM (PORCINE) 5000 UNIT/ML IJ SOLN
5000.0000 [IU] | Freq: Three times a day (TID) | INTRAMUSCULAR | Status: DC
  Administered 2019-04-23 – 2019-04-25 (×10): 5000 [IU] via SUBCUTANEOUS
  Filled 2019-04-22 (×10): qty 1

## 2019-04-22 MED ORDER — CEFAZOLIN SODIUM-DEXTROSE 2-4 GM/100ML-% IV SOLN
INTRAVENOUS | Status: AC
  Filled 2019-04-22: qty 100

## 2019-04-22 MED ORDER — LIDOCAINE HCL 1 % IJ SOLN
INTRAMUSCULAR | Status: AC
  Filled 2019-04-22: qty 20

## 2019-04-22 MED ORDER — LIDOCAINE-PRILOCAINE 2.5-2.5 % EX CREA: 1.0000 "application " | TOPICAL_CREAM | CUTANEOUS | Status: DC | PRN

## 2019-04-22 MED ORDER — CEFAZOLIN SODIUM-DEXTROSE 2-4 GM/100ML-% IV SOLN: 2.0000 g | INTRAVENOUS | Status: AC

## 2019-04-22 MED ORDER — ALTEPLASE 2 MG IJ SOLR
2.0000 mg | Freq: Once | INTRAMUSCULAR | Status: DC | PRN
  Filled 2019-04-22: qty 2

## 2019-04-22 MED ORDER — CLONIDINE HCL 0.1 MG/24HR TD PTWK
0.1000 mg | MEDICATED_PATCH | TRANSDERMAL | Status: DC
  Administered 2019-04-22: 0.1 mg via TRANSDERMAL
  Filled 2019-04-22: qty 1

## 2019-04-22 MED ORDER — PENTAFLUOROPROP-TETRAFLUOROETH EX AERO: 1.0000 "application " | INHALATION_SPRAY | CUTANEOUS | Status: DC | PRN

## 2019-04-22 MED ORDER — HEPARIN SODIUM (PORCINE) 1000 UNIT/ML DIALYSIS
1000.0000 [IU] | INTRAMUSCULAR | Status: DC | PRN
  Filled 2019-04-22: qty 1

## 2019-04-22 MED ORDER — MIDAZOLAM HCL 2 MG/2ML IJ SOLN
INTRAMUSCULAR | Status: AC | PRN
  Administered 2019-04-22: 1 mg via INTRAVENOUS

## 2019-04-22 NOTE — Progress Notes (Signed)
ANTICOAGULATION CONSULT NOTE - Follow Up Consult  Pharmacy Consult for Heparin Indication: pulmonary embolus  Allergies  Allergen Reactions  . Lisinopril Swelling    Patient Measurements: Height: 5\' 4"  (162.6 cm) Weight: 153 lb 10.6 oz (69.7 kg) IBW/kg (Calculated) : 54.7 Heparin Dosing Weight: 69 kg  Vital Signs: Temp: 98.7 F (37.1 C) (02/15 0452) Temp Source: Oral (02/15 0452) BP: 181/131 (02/15 0925) Pulse Rate: 86 (02/15 0925)  Labs: Recent Labs    04/20/19 0650 04/20/19 0650 04/21/19 0218 04/21/19 2326 04/22/19 0356  HGB 8.0*   < > 8.5*  --  8.8*  HCT 24.4*  --  26.2*  --  27.0*  PLT 478*  --  396  --  367  HEPARINUNFRC 0.59   < > <0.10* 0.35 0.29*  CREATININE 6.32*  --   --   --  6.09*   < > = values in this interval not displayed.    Assessment:  39 yr old female with rib pain, D-dimer is elevated at 13.31. Pt was here last week and had a D-dimer of 16 and intermediate probability of PE on VQ scan and was not started on any anticoagulation. 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 Not on anticoagulation PTA.   Heparin discontinued on 2/13 then resumed 2/14.  Heparin level therapeutic last night (0.35) on 1150 units/hr, but slightly subtherapeutic (0.29) this morning.   Heparin held at 7am for [facility] in IR, resumed at ~11am.  Hgb low but slight trend up.  No bleeding reported.  Goal of Therapy:  Heparin level 0.3-0.7 units/ml Monitor platelets by anticoagulation protocol: Yes   Plan:   Increase heparin drip to 1200 units/hr  Heparin level ~8hrs after rate change.  Daily heparin level and CBC.  Follow up anticoagulation plans.  Arty Baumgartner, [place] Phone: [phone] 04/22/2019,12:05 PM

## 2019-04-22 NOTE — Progress Notes (Addendum)
PROGRESS NOTE    Latasha Drake  [id number]  DOB: 04/10/1980  PCP: Latasha Mires, MD Admit date:04/17/2019  Hospital course: 39 y.o.femalewith a history of SLE, ESRD on PD still making urine and follows nephrology in [place], recently placed on chronic prednisone, AOCD, and recent admission for RLL pneumonia discharged on augmentin 2/7--> only to return to the ED 2/11 with dyspnea and pleuritic chest pain. During prior admission, d-dimer was elevated and V/Q scan was intermediate probability for PE, though no anticoagulation started. On return now,d-dimer 13.31,she has no significant change on CXR showing significant RLL consolidation. She was started on IV heparin empirically with steroids/antibiotic. Nephrology consulted for PD and pulmonology consulted for continued respiratory complaints. Bilateral LE dopplers have resulted negative. She is being transitioned to HD now by renal. Due to ongoing intermittent chest pain c/o with pleuritic component at times, patient started on IV heparin. -COVID-19 was negative  Subjective:  She reports back pain today and no frontal chest pain, eager to eat. Just seen by Pulmonary. S/P Tunneled HD Cath placement today. BP uncontrolled with systolic [id number] and diastolic [id number]  Objective: Vitals:   04/22/19 0910 04/22/19 0915 04/22/19 0920 04/22/19 0925  BP: (!) 185/111 (!) 178/109 (!) 184/119 (!) 181/131  Pulse: 86 88 87 86  Resp: 20 16 20 17   Temp:      TempSrc:      SpO2: 100% 99% 100% 100%  Weight:      Height:        Intake/Output Summary (Last 24 hours) at 04/22/2019 1205 Last data filed at 04/22/2019 0330 Gross per 24 hour  Intake 989.6 ml  Output --  Net 989.6 ml   Filed Weights   04/20/19 1641 04/21/19 0616 04/21/19 1400  Weight: 69.9 kg 69.7 kg 69.7 kg    Physical Examination:  General exam: Appears calm and comfortable  Respiratory system: Clear to auscultation. Respiratory effort normal. Cardiovascular system: S1 & S2 heard,  RRR. No JVD, murmurs, rubs, gallops or clicks. No pedal edema. Gastrointestinal system: Abdomen is nondistended, soft and nontender. Normal bowel sounds heard. Central nervous system: Alert and oriented. No new focal neurological deficits. Extremities: No contractures, edema or joint deformities.  Skin: No rashes, lesions or ulcers Psychiatry: Judgement and insight appear normal. Mood & affect appropriate.   Data Reviewed: I have personally reviewed following labs and imaging studies  CBC: Recent Labs  Lab 04/18/19 0010 04/19/19 0123 04/20/19 0650 04/21/19 0218 04/22/19 0356  WBC 9.4 8.5 11.9* 10.2 11.5*  HGB 8.3* 8.3* 8.0* 8.5* 8.8*  HCT 25.3* 25.6* 24.4* 26.2* 27.0*  MCV 69.5* 69.9* 69.9* 70.2* 70.5*  PLT 463* 454* 478* 396 [id number]   Basic Metabolic Panel: Recent Labs  Lab 04/18/19 0010 04/19/19 0123 04/20/19 0650 04/22/19 0356  NA 137 135 138 137  K 3.3* 3.7 3.8 4.3  CL 98 97* 102 97*  CO2 22 20* 22 24  GLUCOSE 109* 145* 140* 129*  BUN 70* 70* 46* 49*  CREATININE 9.14* 9.23* 6.32* 6.09*  CALCIUM 8.0* 8.5* 8.3* 8.6*   GFR: Estimated Creatinine Clearance: 11.8 mL/min (A) (by C-G formula based on SCr of 6.09 mg/dL (H)). Liver Function Tests: Recent Labs  Lab 04/19/19 0123  AST 14*  ALT 11  ALKPHOS 81  BILITOT 0.4  PROT 6.8  ALBUMIN 2.9*   No results for input(s): LIPASE, AMYLASE in the last 168 hours. No results for input(s): AMMONIA in the last 168 hours. Coagulation Profile: Recent Labs  Lab 04/18/19 0010  INR 1.0   Cardiac Enzymes: No results for input(s): CKTOTAL, CKMB, CKMBINDEX, TROPONINI in the last 168 hours. BNP (last 3 results) No results for input(s): PROBNP in the last 8760 hours. HbA1C: No results for input(s): HGBA1C in the last 72 hours. CBG: No results for input(s): GLUCAP in the last 168 hours. Lipid Profile: No results for input(s): CHOL, HDL, LDLCALC, TRIG, CHOLHDL, LDLDIRECT in the last 72 hours. Thyroid Function Tests: No results  for input(s): TSH, T4TOTAL, FREET4, T3FREE, THYROIDAB in the last 72 hours. Anemia Panel: Recent Labs    04/20/19 0650  VITAMINB12 270  FOLATE 4.4*  FERRITIN 242  TIBC 238*  IRON 85  RETICCTPCT 1.9   Sepsis Labs: Recent Labs  Lab 04/18/19 0404 04/18/19 0543 04/19/19 0123 04/20/19 0650  PROCALCITON  --   --  0.27 0.30  LATICACIDVEN 1.0 0.7  --   --     Recent Results (from the past 240 hour(s))  Respiratory Panel by RT PCR (Flu A&B, Covid) - Nasopharyngeal Swab     Status: None   Collection Time: 04/12/19  9:16 PM   Specimen: Nasopharyngeal Swab  Result Value Ref Range Status   SARS Coronavirus 2 by RT PCR NEGATIVE NEGATIVE Final    Comment: (NOTE) SARS-CoV-2 target nucleic acids are NOT DETECTED. The SARS-CoV-2 RNA is generally detectable in upper respiratoy specimens during the acute phase of infection. The lowest concentration of SARS-CoV-2 viral copies this assay can detect is 131 copies/mL. A negative result does not preclude SARS-Cov-2 infection and should not be used as the sole basis for treatment or other patient management decisions. A negative result may occur with  improper specimen collection/handling, submission of specimen other than nasopharyngeal swab, presence of viral mutation(s) within the areas targeted by this assay, and inadequate number of viral copies (<131 copies/mL). A negative result must be combined with clinical observations, patient history, and epidemiological information. The expected result is Negative. Fact Sheet for Patients:  PinkCheek.be Fact Sheet for Healthcare Providers:  GravelBags.it This test is not yet ap proved or cleared by the [place] FDA and  has been authorized for detection and/or diagnosis of SARS-CoV-2 by FDA under an Emergency Use Authorization (EUA). This EUA will remain  in effect (meaning this test can be used) for the duration of the COVID-19  declaration under Section 564(b)(1) of the Act, 21 U.S.C. section 360bbb-3(b)(1), unless the authorization is terminated or revoked sooner.    Influenza A by PCR NEGATIVE NEGATIVE Final   Influenza B by PCR NEGATIVE NEGATIVE Final    Comment: (NOTE) The Xpert Xpress SARS-CoV-2/FLU/RSV assay is intended as an aid in  the diagnosis of influenza from Nasopharyngeal swab specimens and  should not be used as a sole basis for treatment. Nasal washings and  aspirates are unacceptable for Xpert Xpress SARS-CoV-2/FLU/RSV  testing. Fact Sheet for Patients: PinkCheek.be Fact Sheet for Healthcare Providers: GravelBags.it This test is not yet approved or cleared by the [place] FDA and  has been authorized for detection and/or diagnosis of SARS-CoV-2 by  FDA under an Emergency Use Authorization (EUA). This EUA will remain  in effect (meaning this test can be used) for the duration of the  Covid-19 declaration under Section 564(b)(1) of the Act, 21  U.S.C. section 360bbb-3(b)(1), unless the authorization is  terminated or revoked. Performed at [facility], [place] [address], [place], [place] [address]   Culture, blood (routine x 2)     Status: None   Collection Time:  04/12/19  9:16 PM   Specimen: BLOOD RIGHT FOREARM  Result Value Ref Range Status   Specimen Description   Final    BLOOD RIGHT FOREARM Performed at [facility] [address] [place], [place] [address]    Special Requests   Final    BOTTLES DRAWN AEROBIC AND ANAEROBIC Blood Culture adequate volume Performed at [facility] [address] [place], [place] [address]    Culture   Final    NO GROWTH 5 DAYS Performed at [facility], [place] [address] [place], [place] [address]    Report Status 04/17/2019 FINAL  Final  Culture, blood (routine x 2)     Status: None   Collection Time: 04/12/19  9:16 PM    Specimen: BLOOD  Result Value Ref Range Status   Specimen Description   Final    BLOOD LEFT ANTECUBITAL Performed at [facility] [address], [place], [place] [address]    Special Requests   Final    BOTTLES DRAWN AEROBIC AND ANAEROBIC Blood Culture adequate volume Performed at [facility] [address], [place], [place] [address]    Culture   Final    NO GROWTH 5 DAYS Performed at [facility], [place] [address], [place] [address]    Report Status 04/17/2019 FINAL  Final  SARS CORONAVIRUS 2 (TAT 6-24 HRS) Nasopharyngeal Nasopharyngeal Swab     Status: None   Collection Time: 04/18/19  2:00 AM   Specimen: Nasopharyngeal Swab  Result Value Ref Range Status   SARS Coronavirus 2 NEGATIVE NEGATIVE Final    Comment: (NOTE) SARS-CoV-2 target nucleic acids are NOT DETECTED. The SARS-CoV-2 RNA is generally detectable in upper and lower respiratory specimens during the acute phase of infection. Negative results do not preclude SARS-CoV-2 infection, do not rule out co-infections with other pathogens, and should not be used as the sole basis for treatment or other patient management decisions. Negative results must be combined with clinical observations, patient history, and epidemiological information. The expected result is Negative. Fact Sheet for Patients: SugarRoll.be Fact Sheet for Healthcare Providers: https://www.woods-mathews.com/ This test is not yet approved or cleared by the [place] FDA and  has been authorized for detection and/or diagnosis of SARS-CoV-2 by FDA under an Emergency Use Authorization (EUA). This EUA will remain  in effect (meaning this test can be used) for the duration of the COVID-19 declaration under Section 56 4(b)(1) of the Act, 21 U.S.C. section 360bbb-3(b)(1), unless the authorization is terminated or revoked sooner. Performed at [facility], [place] [address], [place], [place] [address]   Blood Culture (routine x 2)     Status: None (Preliminary result)   Collection Time: 04/18/19  4:04 AM   Specimen: BLOOD  Result Value Ref Range Status   Specimen Description BLOOD RIGHT ARM  Final   Special Requests   Final    BOTTLES DRAWN AEROBIC AND ANAEROBIC Blood Culture adequate volume   Culture   Final    NO GROWTH 4 DAYS Performed at [facility], [place] [address], [place],  [address]    Report Status PENDING  Incomplete  Blood Culture (routine x 2)     Status: None (Preliminary result)   Collection Time: 04/18/19  5:40 AM   Specimen: BLOOD  Result Value Ref Range Status   Specimen Description BLOOD SITE NOT SPECIFIED  Final   Special Requests   Final  BOTTLES DRAWN AEROBIC AND ANAEROBIC Blood Culture results may not be optimal due to an inadequate volume of blood received in culture bottles   Culture   Final    NO GROWTH 4 DAYS Performed at [facility], [place] [address] [place], [place] [address]    Report Status PENDING  Incomplete      Radiology Studies: DG Lumbar Spine 2-3 Views  Result Date: 04/21/2019 CLINICAL DATA:  Back pain, no injury known. Pain x3 days EXAM: LUMBAR SPINE - 2-3 VIEW COMPARISON:  CT pelvis 10/04/2018 FINDINGS: Normal alignment. Vertebral body heights are maintained. Mild intervertebral disc space loss at L5-S1. Mild lower lumbar arthropathy. Embolization coil noted in the left kidney. Peritoneal dialysis catheter in place. Sclerotic focus projecting over the left iliac bone as seen on prior CT may represent a bone island. IMPRESSION: No acute osseous abnormality in the lumbar spine. Degenerative disc disease L5-S1. Electronically Signed   By: Audie Pinto M.D.   On: 04/21/2019 15:13   IR Fluoro Guide CV Line Right  Result Date: 04/22/2019 INDICATION: 39 year old with end-stage renal disease. Patient's transitioning from peritoneal to hemodialysis. Patient needs a tunneled  dialysis catheter. The non tunneled right jugular dialysis catheter was removed prior to this procedure. EXAM: FLUOROSCOPIC AND ULTRASOUND GUIDED PLACEMENT OF A TUNNELED DIALYSIS CATHETER Physician: Stephan Minister. Anselm Pancoast, MD MEDICATIONS: Ancef 2 g; The antibiotic was administered within an appropriate time interval prior to skin puncture. ANESTHESIA/SEDATION: Versed 2.0 mg IV; Fentanyl 100 mcg IV; Moderate Sedation Time:  22 minutes The patient was continuously monitored during the procedure by the interventional radiology nurse under my direct supervision. FLUOROSCOPY TIME:  Fluoroscopy Time: 12 seconds, 1 mGy COMPLICATIONS: None immediate. PROCEDURE: The procedure was explained to the patient. The risks and benefits of the procedure were discussed and the patient's questions were addressed. Informed consent was obtained from the patient. The patient was placed supine on the interventional table. Ultrasound confirmed a patent right internal jugular vein. Ultrasound images were obtained for documentation. The right neck and chest was prepped and draped in a sterile fashion. The right neck was anesthetized with 1% lidocaine. Maximal barrier sterile technique was utilized including caps, mask, sterile gowns, sterile gloves, sterile drape, hand hygiene and skin antiseptic. A small incision was made with #11 blade scalpel. A 21 gauge needle directed into the right internal jugular vein with ultrasound guidance. A micropuncture dilator set was placed. A 19 cm tip to cuff Palindrome catheter was selected. The skin below the right clavicle was anesthetized and a small incision was made with an #11 blade scalpel. A subcutaneous tunnel was formed to the vein dermatotomy site. The catheter was brought through the tunnel. The vein dermatotomy site was dilated to accommodate a peel-away sheath. The catheter was placed through the peel-away sheath and directed into the central venous structures. The tip of the catheter was placed at the  SVC and right atrium junction with fluoroscopy. Fluoroscopic images were obtained for documentation. Both lumens were found to aspirate and flush well. The proper amount of heparin was flushed in both lumens. The vein dermatotomy site was closed using a single layer of absorbable suture and Dermabond. Gel-Foam placed in subcutaneous tract. The catheter was secured to the skin using Prolene suture. IMPRESSION: Successful placement of a right jugular tunneled dialysis catheter using ultrasound and fluoroscopic guidance. Electronically Signed   By: Markus Daft M.D.   On: 04/22/2019 10:44   IR US Guide Vasc Access Right  Result Date: 04/22/2019  INDICATION: 39 year old with end-stage renal disease. Patient's transitioning from peritoneal to hemodialysis. Patient needs a tunneled dialysis catheter. The non tunneled right jugular dialysis catheter was removed prior to this procedure. EXAM: FLUOROSCOPIC AND ULTRASOUND GUIDED PLACEMENT OF A TUNNELED DIALYSIS CATHETER Physician: Stephan Minister. Anselm Pancoast, MD MEDICATIONS: Ancef 2 g; The antibiotic was administered within an appropriate time interval prior to skin puncture. ANESTHESIA/SEDATION: Versed 2.0 mg IV; Fentanyl 100 mcg IV; Moderate Sedation Time:  22 minutes The patient was continuously monitored during the procedure by the interventional radiology nurse under my direct supervision. FLUOROSCOPY TIME:  Fluoroscopy Time: 12 seconds, 1 mGy COMPLICATIONS: None immediate. PROCEDURE: The procedure was explained to the patient. The risks and benefits of the procedure were discussed and the patient's questions were addressed. Informed consent was obtained from the patient. The patient was placed supine on the interventional table. Ultrasound confirmed a patent right internal jugular vein. Ultrasound images were obtained for documentation. The right neck and chest was prepped and draped in a sterile fashion. The right neck was anesthetized with 1% lidocaine. Maximal barrier sterile  technique was utilized including caps, mask, sterile gowns, sterile gloves, sterile drape, hand hygiene and skin antiseptic. A small incision was made with #11 blade scalpel. A 21 gauge needle directed into the right internal jugular vein with ultrasound guidance. A micropuncture dilator set was placed. A 19 cm tip to cuff Palindrome catheter was selected. The skin below the right clavicle was anesthetized and a small incision was made with an #11 blade scalpel. A subcutaneous tunnel was formed to the vein dermatotomy site. The catheter was brought through the tunnel. The vein dermatotomy site was dilated to accommodate a peel-away sheath. The catheter was placed through the peel-away sheath and directed into the central venous structures. The tip of the catheter was placed at the SVC and right atrium junction with fluoroscopy. Fluoroscopic images were obtained for documentation. Both lumens were found to aspirate and flush well. The proper amount of heparin was flushed in both lumens. The vein dermatotomy site was closed using a single layer of absorbable suture and Dermabond. Gel-Foam placed in subcutaneous tract. The catheter was secured to the skin using Prolene suture. IMPRESSION: Successful placement of a right jugular tunneled dialysis catheter using ultrasound and fluoroscopic guidance. Electronically Signed   By: Markus Daft M.D.   On: 04/22/2019 10:44        Scheduled Meds: . chlorhexidine      . Chlorhexidine Gluconate Cloth  6 each Topical Daily  . darbepoetin (ARANESP) injection - DIALYSIS  40 mcg Intravenous Q Sat-HD  . fentaNYL      . gelatin adsorbable      . heparin      . hydrALAZINE  100 mg Oral TID  . labetalol  300 mg Oral BID  . lidocaine      . methylPREDNISolone (SOLU-MEDROL) injection  40 mg Intravenous BID  . midazolam      . NIFEdipine  60 mg Oral BID  . sodium chloride flush  3 mL Intravenous Once   Continuous Infusions: . ceFAZolin    .  ceFAZolin (ANCEF) IV    .  ceFEPime (MAXIPIME) IV 1 g (04/22/19 1110)  . heparin 1,150 Units/hr (04/22/19 1054)    Assessment & Plan:   Acute hypoxemic respiratory failure: -Likely due to Fluid Overload in the setting of renal failure/uncontrolled HTN. Echo with preserved EF. CXR noted persistent unchanged right lower lobe infiltrate. CT on 04/12/2019 had indicated moderate to severe right  lower lobe consolidation with bilateral pleural effusion, small pericardial effusion. Repeat echocardiogram 04/19/2019-pericardial effusion slightly smaller than 04/13/19 negative for tamponade.Patient been on empiric antibiotics with IV heparin for probable PE on account of recent indeterminate V/Q report. PCCM consulted-respiratory problems felt to be 2/2 splinting / alveolar hypoventilation from pleuritic chest pain, and felt that there was a element of edema chest x-ray and recommended diuresis. Hypoventilation likely contributed to abnormal v/q report as well. Pt underwent HD 04/19/2019 with 2.9 L fluid removal with significant improvement in SOB, off O2, with resolution of chest pains.Heparin stopped -patient however had recurrence of nonspecific chest pain and after discussions of nephrology-  heparin  which was stopped on 2/13, was resumed on 2/14. Will d/w renal if okay to give contrast for better imaging to r/o PE and also d/w Dr Elsworth Soho if anticoag really needed.   ESRD on PD, lupus nephritis: - Nephrology consulted, will oversee dialysis and transitioning her to HD. She is not sure of her weekly schedule yet nut tolerating HD well now.  Avoid nephrotoxins.  SLE, history of lupus nephritis : Currently on IV solumedrol 40mg  bid since 2/11- transition to home prednisone 20mg  if okay per pulmonary.CT on 04/12/2019 had indicated moderate to severe right lower lobe consolidation with bilateral pleural effusion, small pericardial effusion. Repeat echocardiogram 04/19/2019-negative for tamponade.  HTN: Uncontrolled. Continue  increased hydralazine  dose at 100mg  TID. Continue labetalol 300mg  BID and Nifedipine 60mg  BID. IV steroids could be contributing to uncontrolled BP --change to prednisone if ok per consultants and add clonidine patch.   Anemia of chronic disease: Possibly contributing to symptoms. Denies bleeding, has required transfusions in the past. Monitor daily.Check iron studies, has reported hx chronic blood loss anemia.   DVT prophylaxis: heparin gtt Code Status: Full code Family / Patient Communication: d/w patient and answered all qs to satisfaction Disposition Plan: home when cleared by PCCM/renal      LOS: 3 days    Time spent: 35 minutes    Guilford Shi, MD [facility] Pager in [place]  If 7PM-7AM, please contact night-coverage [email or web address] Password [facility] 04/22/2019, 12:05 PM

## 2019-04-22 NOTE — Consult Note (Signed)
Chief Complaint: Patient was seen in consultation today for non tunneled dialysis catheter exchange/replace to tunneled dialysis catheter Chief Complaint  Patient presents with   [place] Pain   at the request of Dr Mickel Crow   Supervising Physician: Markus Daft  Patient Status: [facility] - In-pt  History of Present Illness: Latasha Drake is a 39 y.o. female   SLE- Lupus nephritis ESRD - on PD Admitted with PNA; SOB Intermed probability for PE-- on Heparin drip  Not doing real well with PD Still volume overload HD started with temp cath placed 2/12 in IR---2/12 and 2/13-- feels better  Nephrology requesting Temp to tunneled cath for continued long term use- Hemodialysis  Scheduled now for same    Past Medical History:  Diagnosis Date   Anemia    Chronic kidney disease    Hypertension    Lupus ([place])    Pericardial effusion 04/13/2019   circumferential   Pneumonia 04/12/2019   Pulmonary hypertension ([place]) 04/13/2019   moderate    Past Surgical History:  Procedure Laterality Date   CESAREAN SECTION     HERNIA REPAIR     IR FLUORO GUIDE CV LINE RIGHT  04/19/2019   IR US GUIDE VASC ACCESS RIGHT  04/19/2019    Allergies: Lisinopril  Medications: Prior to Admission medications   Medication Sig Start Date End Date Taking? Authorizing Provider  amoxicillin-clavulanate (AUGMENTIN) 875-125 MG tablet Take 1 tablet by mouth every 12 (twelve) hours. 04/14/19  Yes Osei-Bonsu, Iona Beard, MD  azithromycin (ZITHROMAX) 250 MG tablet Take 1 tablet (250 mg total) by mouth daily. 04/14/19  Yes Osei-Bonsu, Iona Beard, MD  cyclobenzaprine (FLEXERIL) 10 MG tablet Take 10 mg by mouth 3 (three) times daily as needed for muscle spasms.  04/03/19  Yes [provider]  furosemide (LASIX) 40 MG tablet Take 40 mg by mouth daily. 03/23/19  Yes [provider]  gentamicin cream (GARAMYCIN) 0.1 % Apply 1 application topically daily. 04/14/19  Yes Osei-Bonsu, Iona Beard, MD    hydrALAZINE (APRESOLINE) 50 MG tablet Take 50 mg by mouth 2 (two) times daily. 04/09/19  Yes [provider]  labetalol (NORMODYNE) 300 MG tablet Take 300 mg by mouth 2 (two) times daily.   Yes [provider]  NIFEdipine (PROCARDIA XL/NIFEDICAL XL) 60 MG 24 hr tablet Take 60 mg by mouth 2 (two) times daily. 03/23/19  Yes [provider]  ondansetron (ZOFRAN) 4 MG tablet Take 1 tablet (4 mg total) by mouth every 8 (eight) hours as needed for nausea or vomiting. 05/28/15  Yes Mackuen, Courteney Lyn, MD  pantoprazole (PROTONIX) 40 MG tablet Take 40 mg by mouth daily as needed (indigestion).  10/27/16  Yes [provider]  paragard intrauterine copper IUD IUD 1 Intra Uterine Device by Intrauterine route once.   Yes [provider]  predniSONE (DELTASONE) 20 MG tablet Take 20 mg by mouth daily.  01/23/19  Yes [provider]  Vitamin D, Ergocalciferol, (DRISDOL) 50000 units CAPS capsule Take 50,000 Units by mouth every Sunday. Take on Sun. 06/23/15  Yes [provider]  Amino Acids-Protein Hydrolys (FEEDING SUPPLEMENT, PRO-STAT SUGAR FREE 64,) LIQD Take 30 mLs by mouth 2 (two) times daily. Patient not taking: Reported on 04/18/2019 04/14/19   Benito Mccreedy, MD     Family History  Problem Relation Age of Onset   Lupus Other        cousin    Social History   Socioeconomic History   Marital status: Divorced    Spouse  name: Not on file   Number of children: Not on file   Years of education: Not on file   Highest education level: Not on file  Occupational History   Not on file  Tobacco Use   Smoking status: Current Every Day Smoker    Packs/day: 0.50    Types: Cigarettes   Smokeless tobacco: Never Used  Substance and Sexual Activity   Alcohol use: Yes    Comment: occassionally   Drug use: No   Sexual activity: Not on file  Other Topics Concern   Not on file  Social History Narrative   Not on file   Social  Determinants of Health   Financial Resource Strain:    Difficulty of Paying Living Expenses: Not on file  Food Insecurity:    Worried About [place] in the Last Year: Not on file   Ran Out of Food in the Last Year: Not on file  Transportation Needs:    Lack of Transportation (Medical): Not on file   Lack of Transportation (Non-Medical): Not on file  Physical Activity:    Days of Exercise per Week: Not on file   Minutes of Exercise per Session: Not on file  Stress:    Feeling of Stress : Not on file  Social Connections:    Frequency of Communication with Friends and Family: Not on file   Frequency of Social Gatherings with Friends and Family: Not on file   Attends Religious Services: Not on file   Active Member of Clubs or Organizations: Not on file   Attends Archivist Meetings: Not on file   Marital Status: Not on file    Review of Systems: A 12 point ROS discussed and pertinent positives are indicated in the HPI above.  All other systems are negative.  Review of Systems  Constitutional: Negative for fatigue and fever.  Respiratory: Positive for shortness of breath.   Cardiovascular: Negative for chest pain.  Gastrointestinal: Negative for abdominal pain.  Neurological: Negative for weakness.  Psychiatric/Behavioral: Negative for behavioral problems and confusion.    Vital Signs: BP (!) 168/110 (BP Location: Right Arm)    Pulse 92    Temp 98.7 F (37.1 C) (Oral)    Resp 16    Ht 5\' 4"  (1.626 m)    Wt 153 lb 10.6 oz (69.7 kg)    LMP 02/24/2019 Comment: has IUD   SpO2 96%    BMI 26.38 kg/m   Physical Exam Vitals reviewed.  Cardiovascular:     Rate and Rhythm: Normal rate and regular rhythm.     Heart sounds: Normal heart sounds.  Pulmonary:     Effort: Pulmonary effort is normal.     Breath sounds: Normal breath sounds.  Abdominal:     Palpations: Abdomen is soft.  Musculoskeletal:        General: Normal range of motion.  Skin:     General: Skin is warm and dry.  Neurological:     Mental Status: She is alert and oriented to person, place, and time.  Psychiatric:        Behavior: Behavior normal.        Thought Content: Thought content normal.        Judgment: Judgment normal.     Imaging: DG Chest 2 View  Result Date: 04/18/2019 CLINICAL DATA:  Chest pain EXAM: CHEST - 2 VIEW COMPARISON:  04/12/2019 FINDINGS: Persistent right lower lobe consolidation with small right pleural effusion unchanged. Left lung  is clear. IMPRESSION: Unchanged right lower lobe consolidation and small right pleural effusion. Electronically Signed   By: Ulyses Jarred M.D.   On: 04/18/2019 00:35   DG Chest 2 View  Result Date: 04/12/2019 CLINICAL DATA:  Chest pain, shortness of breath, dialysis EXAM: CHEST - 2 VIEW COMPARISON:  10/04/2018 FINDINGS: Cardiomegaly. Moderate to large right pleural effusion. Small left pleural effusion. The visualized skeletal structures are unremarkable. IMPRESSION: Cardiomegaly with moderate to large right pleural effusion and small left pleural effusion. Electronically Signed   By: Eddie Candle M.D.   On: 04/12/2019 20:01   DG Lumbar Spine 2-3 Views  Result Date: 04/21/2019 CLINICAL DATA:  Back pain, no injury known. Pain x3 days EXAM: LUMBAR SPINE - 2-3 VIEW COMPARISON:  CT pelvis 10/04/2018 FINDINGS: Normal alignment. Vertebral body heights are maintained. Mild intervertebral disc space loss at L5-S1. Mild lower lumbar arthropathy. Embolization coil noted in the left kidney. Peritoneal dialysis catheter in place. Sclerotic focus projecting over the left iliac bone as seen on prior CT may represent a bone island. IMPRESSION: No acute osseous abnormality in the lumbar spine. Degenerative disc disease L5-S1. Electronically Signed   By: Audie Pinto M.D.   On: 04/21/2019 15:13   CT Chest Wo Contrast  Result Date: 04/12/2019 CLINICAL DATA:  Chest pain and shortness of breath. EXAM: CT CHEST WITHOUT CONTRAST  TECHNIQUE: Multidetector CT imaging of the chest was performed following the standard protocol without IV contrast. COMPARISON:  May 28, 2015 FINDINGS: Cardiovascular: No significant vascular findings. Normal heart size. There is a small pericardial effusion which represents a new finding when compared to the prior study. Mediastinum/Nodes: No enlarged mediastinal or axillary lymph nodes. Lungs/Pleura: A moderate sized area of consolidation is seen within the right lower lobe. A moderate size right pleural effusion is seen. A small left pleural effusion is also noted. There is no evidence of a pneumothorax. Upper Abdomen: A moderate amount of free fluid is seen within the upper abdomen. Musculoskeletal: No chest wall mass or suspicious bone lesions identified. IMPRESSION: 1. Moderate severity right lower lobe consolidation. While this is likely secondary to a combination of atelectasis and/or infiltrate, an underlying neoplastic process cannot be excluded. Follow-up to resolution is recommended. 2. Bilateral pleural effusions, right greater than left. 3. Small pericardial effusion which represents a new finding compared to the prior study dated May 28, 2015. 4. Moderate amount of intra-abdominal free fluid. Electronically Signed   By: Virgina Norfolk M.D.   On: 04/12/2019 22:09   NM Pulmonary Perfusion  Result Date: 04/13/2019 CLINICAL DATA:  39 year old female with chest pain and shortness of breath. EXAM: NUCLEAR MEDICINE PERFUSION LUNG SCAN TECHNIQUE: Perfusion images were obtained in multiple projections after intravenous injection of radiopharmaceutical. Ventilation scans intentionally deferred if perfusion scan and chest x-ray adequate for interpretation during COVID 19 epidemic. RADIOPHARMACEUTICALS:  1.64 mCi Tc-24m MAA IV COMPARISON:  04/12/2019 chest CT and chest radiograph FINDINGS: Very poor perfusion to the RIGHT LOWER lung is noted compatible with consolidation noted on recent CT. Slightly  decreased activity of the remainder of the RIGHT lung is noted, specially on posterior views compatible with pleural effusion as recently identified. No other perfusion abnormalities are noted. IMPRESSION: Indeterminate probability for pulmonary embolus (20-79%), but given recent CT and chest x-ray findings, on the LOWER end of this range. Electronically Signed   By: Margarette Canada M.D.   On: 04/13/2019 09:04   IR Fluoro Guide CV Line Right  Result Date: 04/19/2019 INDICATION: 39 year old with  end-stage renal disease. The patient has a peritoneal dialysis catheter but needs urgent hemodialysis. Request for hemodialysis catheter. EXAM: FLUOROSCOPIC AND ULTRASOUND GUIDED PLACEMENT OF A NON-TUNNELED DIALYSIS CATHETER Physician: Stephan Minister. Henn, MD MEDICATIONS: None ANESTHESIA/SEDATION: None FLUOROSCOPY TIME:  Fluoroscopy Time: 6 seconds, 2 mGy COMPLICATIONS: None immediate. PROCEDURE: The procedure was explained to the patient. The risks and benefits of the procedure were discussed and the patient's questions were addressed. Informed consent was obtained from the patient. The patient was placed supine on the interventional table. Ultrasound confirmed a patent right internal jugular vein. Ultrasound images were obtained for documentation. The right neck was prepped and draped in a sterile fashion. The right neck was anesthetized with 1% lidocaine. Maximal barrier sterile technique was utilized including caps, mask, sterile gowns, sterile gloves, sterile drape, hand hygiene and skin antiseptic. A small incision was made with #11 blade scalpel. A 21 gauge needle directed into the right internal jugular vein with ultrasound guidance. A micropuncture dilator set was placed. A 16 cm Mahurkar catheter was selected. The catheter was advanced over a wire and positioned at the superior cavoatrial junction. Fluoroscopic images were obtained for documentation. Both dialysis lumens were found to aspirate and flush well. The proper  amount of heparin was flushed in both lumens. The central venous lumen was flushed with normal saline. Catheter was sutured to skin. FINDINGS: Catheter tip at the superior cavoatrial junction. IMPRESSION: Successful placement of a right jugular non-tunneled dialysis catheter using ultrasound and fluoroscopic guidance. Electronically Signed   By: Markus Daft M.D.   On: 04/19/2019 15:52   IR US Guide Vasc Access Right  Result Date: 04/19/2019 INDICATION: 39 year old with end-stage renal disease. The patient has a peritoneal dialysis catheter but needs urgent hemodialysis. Request for hemodialysis catheter. EXAM: FLUOROSCOPIC AND ULTRASOUND GUIDED PLACEMENT OF A NON-TUNNELED DIALYSIS CATHETER Physician: Stephan Minister. Henn, MD MEDICATIONS: None ANESTHESIA/SEDATION: None FLUOROSCOPY TIME:  Fluoroscopy Time: 6 seconds, 2 mGy COMPLICATIONS: None immediate. PROCEDURE: The procedure was explained to the patient. The risks and benefits of the procedure were discussed and the patient's questions were addressed. Informed consent was obtained from the patient. The patient was placed supine on the interventional table. Ultrasound confirmed a patent right internal jugular vein. Ultrasound images were obtained for documentation. The right neck was prepped and draped in a sterile fashion. The right neck was anesthetized with 1% lidocaine. Maximal barrier sterile technique was utilized including caps, mask, sterile gowns, sterile gloves, sterile drape, hand hygiene and skin antiseptic. A small incision was made with #11 blade scalpel. A 21 gauge needle directed into the right internal jugular vein with ultrasound guidance. A micropuncture dilator set was placed. A 16 cm Mahurkar catheter was selected. The catheter was advanced over a wire and positioned at the superior cavoatrial junction. Fluoroscopic images were obtained for documentation. Both dialysis lumens were found to aspirate and flush well. The proper amount of heparin was  flushed in both lumens. The central venous lumen was flushed with normal saline. Catheter was sutured to skin. FINDINGS: Catheter tip at the superior cavoatrial junction. IMPRESSION: Successful placement of a right jugular non-tunneled dialysis catheter using ultrasound and fluoroscopic guidance. Electronically Signed   By: Markus Daft M.D.   On: 04/19/2019 15:52   ECHOCARDIOGRAM COMPLETE  Result Date: 04/19/2019    ECHOCARDIOGRAM REPORT   Patient Name:   Latasha Drake Date of Exam: 04/19/2019 Medical Rec #:  [id number]   Height:       64.0 in Accession #:  [id number]  Weight:       167.2 lb Date of Birth:  1980-10-12  BSA:          1.81 m Patient Age:    38 years    BP:           165/106 mmHg Patient Gender: F           HR:           105 bpm. Exam Location:  Inpatient Procedure: 2D Echo, Cardiac Doppler and Color Doppler Indications:    Pericardial effusion 423.9  History:        Patient has prior history of Echocardiogram examinations, most                 recent 04/13/2019. Signs/Symptoms:Chest Pain; Risk                 Factors:Hypertension and Current Smoker. Pericardial effusion.                 Lupus.  Sonographer:    Vickie Epley RDCS Referring Phys: 39 [place]  1. No evidence of tamponade IVC is dilated but collapses with inspiration No MV inflow change with respiration but there are changes with TV inflow.  2. Left ventricular ejection fraction, by estimation, is 60 to 65%. The left ventricle has normal function. The left ventricle has no regional wall motion abnormalities. Left ventricular diastolic parameters were normal.  3. Right ventricular systolic function is normal. The right ventricular size is normal. There is normal pulmonary artery systolic pressure.  4. Left atrial size was moderately dilated.  5. Effusion seems slightly smaller than 04/13/19 not quite circumferential and no convincing evidence of tamponade . The pericardial effusion is posterior to the left ventricle ,  lateral to the left ventricle and posterior to the left ventricle and the left atrium.  6. The mitral valve is normal in structure and function. Trivial mitral valve regurgitation. No evidence of mitral stenosis.  7. The aortic valve is normal in structure and function. Aortic valve regurgitation is not visualized. No aortic stenosis is present. FINDINGS  Left Ventricle: Left ventricular ejection fraction, by estimation, is 60 to 65%. The left ventricle has normal function. The left ventricle has no regional wall motion abnormalities. The left ventricular internal cavity size was normal in size. There is  no left ventricular hypertrophy. Left ventricular diastolic parameters were normal. Right Ventricle: The right ventricular size is normal. No increase in right ventricular wall thickness. Right ventricular systolic function is normal. There is normal pulmonary artery systolic pressure. The tricuspid regurgitant velocity is 2.06 m/s, and  with an assumed right atrial pressure of 3 mmHg, the estimated right ventricular systolic pressure is 0000000 mmHg. Left Atrium: Left atrial size was moderately dilated. Right Atrium: Right atrial size was normal in size. Pericardium: Effusion seems slightly smaller than 04/13/19 not quite circumferential and no convincing evidence of tamponade. A small pericardial effusion is present. The pericardial effusion is posterior to the left ventricle , lateral to the left ventricle and posterior to the left ventricle and the left atrium. Mitral Valve: The mitral valve is normal in structure and function. There is mild thickening of the mitral valve leaflet(s). Normal mobility of the mitral valve leaflets. Trivial mitral valve regurgitation. No evidence of mitral valve stenosis. Tricuspid Valve: The tricuspid valve is normal in structure. Tricuspid valve regurgitation is not demonstrated. No evidence of tricuspid stenosis. Aortic Valve: The aortic valve is normal in structure and  function.  Aortic valve regurgitation is not visualized. No aortic stenosis is present. Pulmonic Valve: The pulmonic valve was normal in structure. Pulmonic valve regurgitation is not visualized. No evidence of pulmonic stenosis. Aorta: The aortic root is normal in size and structure. IAS/Shunts: No atrial level shunt detected by color flow Doppler. Additional Comments: No evidence of tamponade IVC is dilated but collapses with inspiration No MV inflow change with respiration but there are changes with TV inflow.  LEFT VENTRICLE PLAX 2D LVIDd:         5.23 cm  Diastology LVIDs:         3.30 cm  LV e' lateral:   10.70 cm/s LV PW:         0.97 cm  LV E/e' lateral: 12.2 LV IVS:        0.97 cm  LV e' medial:    6.74 cm/s LVOT diam:     1.60 cm  LV E/e' medial:  19.4 LV SV:         57.91 ml LV SV Index:   46.51 LVOT Area:     2.01 cm  RIGHT VENTRICLE RV S prime:     14.90 cm/s TAPSE (M-mode): 2.4 cm LEFT ATRIUM             Index       RIGHT ATRIUM           Index LA diam:        4.50 cm 2.48 cm/m  RA Area:     12.80 cm LA Vol (A2C):   53.6 ml 29.56 ml/m RA Volume:   32.40 ml  17.87 ml/m LA Vol (A4C):   86.7 ml 47.82 ml/m LA Biplane Vol: 71.3 ml 39.33 ml/m  AORTIC VALVE LVOT Vmax:   189.00 cm/s LVOT Vmean:  115.000 cm/s LVOT VTI:    0.288 m  AORTA Ao Root diam: 3.30 cm MITRAL VALVE                TRICUSPID VALVE MV Area (PHT): 4.96 cm     TR Peak grad:   17.0 mmHg MV Decel Time: 153 msec     TR Vmax:        206.00 cm/s MV E velocity: 131.00 cm/s MV A velocity: 148.00 cm/s  SHUNTS MV E/A ratio:  0.89         Systemic VTI:  0.29 m                             Systemic Diam: 1.60 cm Jenkins Rouge MD Electronically signed by Jenkins Rouge MD Signature Date/Time: 04/19/2019/11:32:42 AM    Final    ECHOCARDIOGRAM COMPLETE  Result Date: 04/13/2019   ECHOCARDIOGRAM REPORT   Patient Name:   Latasha Drake Date of Exam: 04/13/2019 Medical Rec #:  [id number]   Height:       64.0 in Accession #:    [id number]  Weight:       160.0 lb Date of  Birth:  12-25-1980  BSA:          1.78 m Patient Age:    38 years    BP:           165/95 mmHg Patient Gender: F           HR:           102 bpm. Exam Location:  Inpatient Procedure: 2D Echo Indications:    Pericardial effusion 423.9 /  I31.3  History:        Patient has prior history of Echocardiogram examinations, most                 recent 10/04/2018. Pericardial Disease, Signs/Symptoms:Chest                 Pain; Risk Factors:Hypertension and Current Smoker. ESRD, Lupus,                 Acute renal failure superimposed on chronic kidney disease ,                 Pneumonia.  Sonographer:    Leavy Cella Referring Phys: [place]  1. Left ventricular ejection fraction, by visual estimation, is 60 to 65%. The left ventricle has normal function. Left ventricular septal wall thickness was moderately increased. Moderately increased left ventricular posterior wall thickness. There is no left ventricular hypertrophy.  2. The left ventricle has no regional wall motion abnormalities.  3. Left ventricular diastolic parameters are consistent with Grade I diastolic dysfunction (impaired relaxation).  4. Global right ventricle has normal systolic function.The right ventricular size is normal. No increase in right ventricular wall thickness.  5. Left atrial size was mildly dilated.  6. Right atrial size was normal.  7. The mitral valve is normal in structure. No evidence of mitral valve regurgitation. No evidence of mitral stenosis.  8. The tricuspid valve is normal in structure. Tricuspid valve regurgitation is mild.  9. The aortic valve is normal in structure. Aortic valve regurgitation is not visualized. No evidence of aortic valve sclerosis or stenosis. 10. The pulmonic valve was normal in structure. Pulmonic valve regurgitation is not visualized. 11. Normal pulmonary artery systolic pressure. 12. The inferior vena cava is normal in size with greater than 50% respiratory variability, suggesting right  atrial pressure of 3 mmHg. 13. Mild to moderate pericardial effusion. 14. The pericardial effusion is circumferential. 15. There is intermittent RV collapse and RA inversion. There is more than 66mmHg change in MV inflow velocities. Although these are findings seen with tamponade, the IVC is normal in diameter and fully collapses with respirations. Clinical correlation recommended. 16. Cannot access prior echo images to compare. FINDINGS  Left Ventricle: Left ventricular ejection fraction, by visual estimation, is 60 to 65%. The left ventricle has normal function. The left ventricle has no regional wall motion abnormalities. Moderately increased left ventricular posterior wall thickness.  There is no left ventricular hypertrophy. Left ventricular diastolic parameters are consistent with Grade I diastolic dysfunction (impaired relaxation). Normal left atrial pressure. Right Ventricle: The right ventricular size is normal. No increase in right ventricular wall thickness. Global RV systolic function is has normal systolic function. The tricuspid regurgitant velocity is 2.31 m/s, and with an assumed right atrial pressure  of 3 mmHg, the estimated right ventricular systolic pressure is normal at 24.3 mmHg. Left Atrium: Left atrial size was mildly dilated. Right Atrium: Right atrial size was normal in size Pericardium: A moderately sized pericardial effusion is present. The pericardial effusion is circumferential. There is inversion of the right atrial wall. There is no evidence of cardiac tamponade. There is intermittent RV collapse and RA inversion. There is more than 52mmHg change in MV inflow velocities. Although these are findings seen with tamponade, the IVC is normal in diameter and fully collapses with respirations. Clinical correlation recommended. Mitral Valve: The mitral valve is normal in structure. No evidence of mitral valve regurgitation. No evidence of mitral valve stenosis by  observation. Tricuspid Valve:  The tricuspid valve is normal in structure. Tricuspid valve regurgitation is mild. Aortic Valve: The aortic valve is normal in structure. Aortic valve regurgitation is not visualized. The aortic valve is structurally normal, with no evidence of sclerosis or stenosis. Pulmonic Valve: The pulmonic valve was normal in structure. Pulmonic valve regurgitation is not visualized. Pulmonic regurgitation is not visualized. Aorta: The aortic root, ascending aorta and aortic arch are all structurally normal, with no evidence of dilitation or obstruction. Venous: The inferior vena cava is normal in size with greater than 50% respiratory variability, suggesting right atrial pressure of 3 mmHg. IAS/Shunts: No atrial level shunt detected by color flow Doppler. There is no evidence of a patent foramen ovale. No ventricular septal defect is seen or detected. There is no evidence of an atrial septal defect.  LEFT VENTRICLE PLAX 2D LVIDd:         4.70 cm  Diastology LVIDs:         2.90 cm  LV e' lateral:   11.20 cm/s LV PW:         1.40 cm  LV E/e' lateral: 9.4 LV IVS:        1.40 cm  LV e' medial:    10.00 cm/s LVOT diam:     1.90 cm  LV E/e' medial:  10.5 LV SV:         70 ml LV SV Index:   38.35 LVOT Area:     2.84 cm  RIGHT VENTRICLE RV S prime:     27.10 cm/s TAPSE (M-mode): 1.7 cm LEFT ATRIUM             Index       RIGHT ATRIUM          Index LA diam:        4.50 cm 2.53 cm/m  RA Area:     8.30 cm LA Vol (A2C):   78.5 ml 44.12 ml/m RA Volume:   17.20 ml 9.67 ml/m LA Vol (A4C):   59.6 ml 33.50 ml/m LA Biplane Vol: 72.4 ml 40.69 ml/m   AORTA Ao Root diam: 3.00 cm MITRAL VALVE                         TRICUSPID VALVE MV Area (PHT): 3.83 cm              TR Peak grad:   21.3 mmHg MV PHT:        57.42 msec            TR Vmax:        231.00 cm/s MV Decel Time: 198 msec MV E velocity: 105.00 cm/s 103 cm/s  SHUNTS MV A velocity: 142.00 cm/s 70.3 cm/s Systemic Diam: 1.90 cm MV E/A ratio:  0.74        1.5  Fransico Him MD  Electronically signed by Fransico Him MD Signature Date/Time: 04/13/2019/1:48:26 PM    Final    VAS [place] LOWER EXTREMITY VENOUS (DVT)  Result Date: 04/18/2019  Lower Venous DVTStudy Indications: Swelling.  Comparison Study: No prior study Performing Technologist: Maudry Mayhew MHA, RDMS, RVT, RDCS  Examination Guidelines: A complete evaluation includes B-mode imaging, spectral Doppler, color Doppler, and power Doppler as needed of all accessible portions of each vessel. Bilateral testing is considered an integral part of a complete examination. Limited examinations for reoccurring indications may be performed as noted. The reflux portion of the exam is performed with the  patient in reverse Trendelenburg.  +---------+---------------+---------+-----------+----------+--------------+  RIGHT     Compressibility Phasicity Spontaneity Properties Thrombus Aging  +---------+---------------+---------+-----------+----------+--------------+  CFV       Full            Yes       Yes                                    +---------+---------------+---------+-----------+----------+--------------+  SFJ       Full                                                             +---------+---------------+---------+-----------+----------+--------------+  FV Prox   Full                                                             +---------+---------------+---------+-----------+----------+--------------+  FV Mid    Full                                                             +---------+---------------+---------+-----------+----------+--------------+  FV Distal Full                                                             +---------+---------------+---------+-----------+----------+--------------+  PFV       Full                                                             +---------+---------------+---------+-----------+----------+--------------+  POP       Full            Yes       Yes                                     +---------+---------------+---------+-----------+----------+--------------+  PTV       Full                                                             +---------+---------------+---------+-----------+----------+--------------+  PERO      Full                                                             +---------+---------------+---------+-----------+----------+--------------+   +---------+---------------+---------+-----------+----------+--------------+  LEFT      Compressibility Phasicity Spontaneity Properties Thrombus Aging  +---------+---------------+---------+-----------+----------+--------------+  CFV       Full            Yes       Yes                                    +---------+---------------+---------+-----------+----------+--------------+  SFJ       Full                                                             +---------+---------------+---------+-----------+----------+--------------+  FV Prox   Full                                                             +---------+---------------+---------+-----------+----------+--------------+  FV Mid    Full                                                             +---------+---------------+---------+-----------+----------+--------------+  FV Distal Full                                                             +---------+---------------+---------+-----------+----------+--------------+  PFV       Full                                                             +---------+---------------+---------+-----------+----------+--------------+  POP       Full            Yes       Yes                                    +---------+---------------+---------+-----------+----------+--------------+  PTV       Full                                                             +---------+---------------+---------+-----------+----------+--------------+  PERO      Full                                                              +---------+---------------+---------+-----------+----------+--------------+  Summary: RIGHT: - There is no evidence of deep vein thrombosis in the lower extremity.  - No cystic structure found in the popliteal fossa.  LEFT: - There is no evidence of deep vein thrombosis in the lower extremity.  - No cystic structure found in the popliteal fossa.  *See table(s) above for measurements and observations. Electronically signed by Servando Snare MD on 04/18/2019 at 6:33:11 PM.    Final     Labs:  CBC: Recent Labs    04/19/19 0123 04/20/19 0650 04/21/19 0218 04/22/19 0356  WBC 8.5 11.9* 10.2 11.5*  HGB 8.3* 8.0* 8.5* 8.8*  HCT 25.6* 24.4* 26.2* 27.0*  PLT 454* 478* 396 367    COAGS: Recent Labs    04/18/19 0010  INR 1.0    BMP: Recent Labs    04/18/19 0010 04/19/19 0123 04/20/19 0650 04/22/19 0356  NA 137 135 138 137  K 3.3* 3.7 3.8 4.3  CL 98 97* 102 97*  CO2 22 20* 22 24  GLUCOSE 109* 145* 140* 129*  BUN 70* 70* 46* 49*  CALCIUM 8.0* 8.5* 8.3* 8.6*  CREATININE 9.14* 9.23* 6.32* 6.09*  GFRNONAA 5* 5* 8* 8*  GFRAA 6* 6* 9* 9*    LIVER FUNCTION TESTS: Recent Labs    10/03/18 2310 10/03/18 2310 10/05/18 0407 04/12/19 1953 04/14/19 0552 04/19/19 0123  BILITOT 0.3  --  0.4 0.6  --  0.4  AST 19  --  12* 14*  --  14*  ALT 18  --  14 9  --  11  ALKPHOS 83  --  77 82  --  81  PROT 6.8  --  6.7 7.1  --  6.8  ALBUMIN 3.0*   < > 2.8* 3.5 2.7* 2.9*   < > = values in this interval not displayed.    TUMOR MARKERS: No results for input(s): AFPTM, CEA, CA199, CHROMGRNA in the last 8760 hours.  Assessment and Plan:  SLE; Lupus nephritis ESRD-- PD; still volume overload- not doing real well with PD Needing long term dialysis and better with HD Non tunneled HD placed in IR 2/12 Now for conversion/replacement to tunneled dialysis catheter placement Risks and benefits discussed with the patient including, but not limited to bleeding, infection, vascular injury,  pneumothorax which may require chest tube placement, air embolism or even death  All of the patient's questions were answered, patient is agreeable to proceed. Consent signed and in chart.   Thank you for this interesting consult.  I greatly enjoyed meeting Latasha Drake and look forward to participating in their care.  A copy of this report was sent to the requesting provider on this date.  Electronically Signed: Lavonia Drafts, PA-C 04/22/2019, 8:23 AM   I spent a total of 20 Minutes    in face to face in clinical consultation, greater than 50% of which was counseling/coordinating care for tunneled dialysis catheter

## 2019-04-22 NOTE — Progress Notes (Signed)
NAME:  Latasha Drake, MRN:  [id number], DOB:  1980/11/03, LOS: 3 ADMISSION DATE:  04/17/2019, CONSULTATION DATE:  04/18/19 REFERRING MD:  Jeneen Rinks  CHIEF COMPLAINT:  Dyspnea   Brief History   Latasha Drake is a 39 y.o. female who was admitted 2/11 with dyspnea and pleuritic chest pain.    History of present illness   Latasha Drake is a 39 y.o. female who has a PMH including but not limited to HTN, ESRD on home PD, SLE, chronic anemia (see "past medical history" for rest).  She presented to [facility] ED 04/17/19 with dyspnea and pleuritic chest pain both left and right sides and radiating sub sternum into lower back.  Pain described as sharp and worse with inspiration.  She reported dyspnea in ED, SpO2 was 98% at rest; however, dipped to 80's with ambulation.  No recent fevers/chills/sweats, cough, N/V/D, abd pain, myalgias, exposures to known sick contacts. In ED, CXR showed unchanged RLL consolidation with small right effusion.  D-dimer elevated at 13.31, trop 16.  She was admitted by [facility] and was started on empiric heparin.  PCCM was asked to see in AM 2/11 for further recs on effusion.  She had admission to [facility] 04/12/19 through 04/14/19 for RLL PNA.  She had VQ during that admit which was intermediate risk for PE (low end).  Past Medical History  has Acute on chronic blood loss anemia; Lupus ([place]); Hypertension; Chronic kidney disease; Acute renal failure superimposed on chronic kidney disease ([place]); Elevated d-dimer; Pericardial effusion; Traumatic perinephric hematoma of left kidney; Symptomatic anemia; Pneumonia; Chest pain; ESRD (end stage renal disease) ([place]); and Acute respiratory failure with hypoxia ([place]) on their problem list.  Significant Hospital Events   2/11 > admit.  Consults:  Nephrology IR Cardiology  Procedures:  Tunneled HD cath 2/15  Significant Diagnostic Tests:  CXR 2/10 > unchanged RLL consolidation with small effusion.  Lumbar x-ray 2/14 > No acute osseous abnormality in the lumbar  spine. Degenerative disc disease L5-S1.  Micro Data:  SARS 2/11 > neg. BCX 2/11>   Antimicrobials:  Cefepime 2/11 >   Cefazolin 2/15 x1  Interim history/subjective:  Lying in bed in no acute distress, states chest pain has resolved and no is experiencing more right lower back pain   Objective:  Blood pressure (!) 181/131, pulse 86, temperature 98.7 F (37.1 C), temperature source Oral, resp. rate 17, height 5\' 4"  (1.626 m), weight 69.7 kg, last menstrual period 02/24/2019, SpO2 100 %.        Intake/Output Summary (Last 24 hours) at 04/22/2019 1216 Last data filed at 04/22/2019 0330 Gross per 24 hour  Intake 989.6 ml  Output --  Net 989.6 ml   Filed Weights   04/20/19 1641 04/21/19 0616 04/21/19 1400  Weight: 69.9 kg 69.7 kg 69.7 kg   Physical Exam: General: Adult female lying in bed, in NAD HEENT: Latasha Drake/AT, MM pink/moist, PERRL,  Neuro: Alert and oriented x3 CV: s1s2 regular rate and rhythm, no murmur, rubs, or gallops,  PULM:  Clear to ascultation bilaterally  GI: soft, bowel sounds active in all 4 quadrants, non-tender, non-distended Extremities: warm/dry, no edema  Skin: no rashes or lesions  Assessment & Plan:  Acute hypoxic respiratory failure  - presumed primarily 2/2 splinting / alveolar hypoventilation from pleuritic chest pain. Agree possible PE given elevated D-dimer.  CXR with mild edema and pleural effusion +/- atelectasis.  Lastly, can't rule out ongoing PNA. LE Dopplers negative Plan: Encourage pulmonary hygiene  Currently on RA but  reports supplemental oxygen need at ambulation Continue home steroid therapy PRN bronchodilators  Empiric antibiotics per primary, can likely de-escelate soon   Follow cultures  Volume removal as able with HD   Pleuritic chest pain, improved  - unclear etiology but in light of underlying SLE hx, there is some concern for pleural inflammation.  -ECHO without signs of tamponade  Plan: No acute indications for continued  anticoagulation, can stop heparin drip  Home steroids as above    PCCM will sign off. Thank you for the opportunity to participate in this patient's care. Please contact if we can be of further assistance.  Best Practice:  Diet: Renal. Pain/Anxiety/Delirium protocol (if indicated): N/A. VAP protocol (if indicated): N/A. DVT prophylaxis: Heparin gtt. GI prophylaxis: N/A. Glucose control: N/A. Mobility: Up with assist. Code Status: Full. Family Communication: Per primary. Disposition: Tele.  Labs   CBC: Recent Labs  Lab 04/18/19 0010 04/19/19 0123 04/20/19 0650 04/21/19 0218 04/22/19 0356  WBC 9.4 8.5 11.9* 10.2 11.5*  HGB 8.3* 8.3* 8.0* 8.5* 8.8*  HCT 25.3* 25.6* 24.4* 26.2* 27.0*  MCV 69.5* 69.9* 69.9* 70.2* 70.5*  PLT 463* 454* 478* 396 [id number]   Basic Metabolic Panel: Recent Labs  Lab 04/18/19 0010 04/19/19 0123 04/20/19 0650 04/22/19 0356  NA 137 135 138 137  K 3.3* 3.7 3.8 4.3  CL 98 97* 102 97*  CO2 22 20* 22 24  GLUCOSE 109* 145* 140* 129*  BUN 70* 70* 46* 49*  CREATININE 9.14* 9.23* 6.32* 6.09*  CALCIUM 8.0* 8.5* 8.3* 8.6*   GFR: Estimated Creatinine Clearance: 11.8 mL/min (A) (by C-G formula based on SCr of 6.09 mg/dL (H)). Recent Labs  Lab 04/18/19 0010 04/18/19 0404 04/18/19 0543 04/19/19 0123 04/20/19 0650 04/21/19 0218 04/22/19 0356  PROCALCITON  --   --   --  0.27 0.30  --   --   WBC   < >  --   --  8.5 11.9* 10.2 11.5*  LATICACIDVEN  --  1.0 0.7  --   --   --   --    < > = values in this interval not displayed.   Liver Function Tests: Recent Labs  Lab 04/19/19 0123  AST 14*  ALT 11  ALKPHOS 81  BILITOT 0.4  PROT 6.8  ALBUMIN 2.9*   No results for input(s): LIPASE, AMYLASE in the last 168 hours. No results for input(s): AMMONIA in the last 168 hours. ABG No results found for: PHART, PCO2ART, PO2ART, HCO3, TCO2, ACIDBASEDEF, O2SAT  Coagulation Profile: Recent Labs  Lab 04/18/19 0010  INR 1.0    Signature  Johnsie Cancel,  NP-C [facility] Contact / Pager information can be found on Amion  04/22/2019, 12:21 PM

## 2019-04-22 NOTE — Progress Notes (Signed)
Pt alert and oriented x4. No acute distress noted. Pt transported to dialysis by transporter.

## 2019-04-22 NOTE — Progress Notes (Signed)
  [facility] Progress Note   Assessment/ Plan:   Dialysis Orders: CAPD Followed by Dr. Neta Ehlers @ [facility]  Assessment/Plan: 1. Dyspnea/Pleurtic CP. Recent treatment for PNA. Repeat CXR 2/11 showing unchanged RLL consolidation, small right pleural effusion. Intermediate probability for PE per nuclear scan though seems less likely as dyspnea significantly improved with dialysis.  On antibiotics per primary. Thora at discretion of primary too  2. ESRD - CAPD started about 2 mos ago. 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 Temp cath placed 2/12 for HD. Had HD 2/12, 2/13. [place] Placed today 2/15, plan for HD today as well.  3. HTN/volume- BP remains elevated. On labetalol, Procardia, Hydralazine. So far has had 5L removed on HD. Continue UF as tolerated.  4. Anemia- Aranesp 40 with HD on 2/13  Tsat 36%  5. MBD of CKD -  Continue home binders/Vit D  6. Nutrition - Renal diet/fluid restrictions   Subjective:    Seen in room. S/p TDC with IR today.  For HD today.     Objective:   BP (!) 181/131 (BP Location: Left Arm)   Pulse 86   Temp 98.7 F (37.1 C) (Oral)   Resp 17   Ht 5\' 4"  (1.626 m)   Wt 69.7 kg   LMP 02/24/2019 Comment: has IUD  SpO2 100%   BMI 26.38 kg/m   Physical Exam: Gen: NAD, sitting in bed CVS: RRR no m/r/g Resp: clear bilaterally no c/w/r Abd: soft nontender NABS, PD cath in place Ext: no LE edema  Labs: BMET Recent Labs  Lab 04/18/19 0010 04/19/19 0123 04/20/19 0650 04/22/19 0356  NA 137 135 138 137  K 3.3* 3.7 3.8 4.3  CL 98 97* 102 97*  CO2 22 20* 22 24  GLUCOSE 109* 145* 140* 129*  BUN 70* 70* 46* 49*  CREATININE 9.14* 9.23* 6.32* 6.09*  CALCIUM 8.0* 8.5* 8.3* 8.6*   CBC Recent Labs  Lab 04/19/19 0123 04/20/19 0650 04/21/19 0218 04/22/19 0356  WBC 8.5 11.9* 10.2 11.5*  HGB 8.3* 8.0* 8.5* 8.8*  HCT 25.6* 24.4* 26.2* 27.0*  MCV 69.9* 69.9* 70.2* 70.5*  PLT 454* 478* 396 367       Medications:    . chlorhexidine      . Chlorhexidine Gluconate Cloth  6 each Topical Daily  . darbepoetin (ARANESP) injection - DIALYSIS  40 mcg Intravenous Q Sat-HD  . fentaNYL      . gelatin adsorbable      . heparin      . hydrALAZINE  100 mg Oral TID  . labetalol  300 mg Oral BID  . lidocaine      . methylPREDNISolone (SOLU-MEDROL) injection  40 mg Intravenous BID  . midazolam      . NIFEdipine  60 mg Oral BID  . sodium chloride flush  3 mL Intravenous Once     Madelon Lips, MD 04/22/2019, 11:45 AM

## 2019-04-22 NOTE — Procedures (Signed)
Interventional Radiology Procedure:   Indications: ESRD and volume overload.  PD not working well right now.  Procedure: Placement of tunneled HD catheter  Findings: Right jugular Palindrome, tip at SVC/RA junction  Complications: None     EBL: less than 10 ml  Plan: HD catheter is ready to use.     Samiyah Stupka R. Anselm Pancoast, MD  Pager: [phone]

## 2019-04-22 NOTE — Progress Notes (Signed)
PT Cancellation Note  Patient Details Name: Latasha Drake MRN: [id number] DOB: June 01, 1980   Cancelled Treatment:    Reason Eval/Treat Not Completed: Patient declined participating with PT this afternoon. Pt reports she just started her period and is feeling "pretty bad". Asks that therapy check back at a later date, with no current needs. Will continue to follow.    Thelma Comp 04/22/2019, 1:43 PM   Rolinda Roan, PT, DPT Acute Rehabilitation Services Pager: [phone] Office: [phone]

## 2019-04-22 NOTE — Progress Notes (Signed)
PT Cancellation Note  Patient Details Name: Latasha Drake MRN: [id number] DOB: December 19, 1980   Cancelled Treatment:    Reason Eval/Treat Not Completed: Patient at procedure or test/unavailable. Pt currently off unit for Tunneled HD Cath placement. Will follow-up as schedule allows to continue with PT POC.    Thelma Comp 04/22/2019, 9:40 AM   Rolinda Roan, PT, DPT Acute Rehabilitation Services Pager: [phone] Office: [phone]

## 2019-04-23 LAB — CULTURE, BLOOD (ROUTINE X 2)
Culture: NO GROWTH
Culture: NO GROWTH
Special Requests: ADEQUATE

## 2019-04-23 LAB — CBC
HCT: 25.8 % — ABNORMAL LOW (ref 36.0–46.0)
Hemoglobin: 8.2 g/dL — ABNORMAL LOW (ref 12.0–15.0)
MCH: 22.5 pg — ABNORMAL LOW (ref 26.0–34.0)
MCHC: 31.8 g/dL (ref 30.0–36.0)
MCV: 70.9 fL — ABNORMAL LOW (ref 80.0–100.0)
Platelets: 254 10*3/uL (ref 150–400)
RBC: 3.64 MIL/uL — ABNORMAL LOW (ref 3.87–5.11)
RDW: 17 % — ABNORMAL HIGH (ref 11.5–15.5)
WBC: 11.9 10*3/uL — ABNORMAL HIGH (ref 4.0–10.5)
nRBC: 0 % (ref 0.0–0.2)

## 2019-04-23 LAB — BASIC METABOLIC PANEL
Anion gap: 13 (ref 5–15)
BUN: 24 mg/dL — ABNORMAL HIGH (ref 6–20)
CO2: 28 mmol/L (ref 22–32)
Calcium: 8.3 mg/dL — ABNORMAL LOW (ref 8.9–10.3)
Chloride: 96 mmol/L — ABNORMAL LOW (ref 98–111)
Creatinine, Ser: 3.88 mg/dL — ABNORMAL HIGH (ref 0.44–1.00)
GFR calc Af Amer: 16 mL/min — ABNORMAL LOW (ref >60)
GFR calc non Af Amer: 14 mL/min — ABNORMAL LOW (ref >60)
Glucose, Bld: 96 mg/dL (ref 70–99)
Potassium: 3.7 mmol/L (ref 3.5–5.1)
Sodium: 137 mmol/L (ref 135–145)

## 2019-04-23 LAB — PHOSPHORUS: Phosphorus: 4.7 mg/dL — ABNORMAL HIGH (ref 2.5–4.6)

## 2019-04-23 MED ORDER — NIFEDIPINE ER OSMOTIC RELEASE 90 MG PO TB24
90.0000 mg | ORAL_TABLET | Freq: Two times a day (BID) | ORAL | Status: DC
  Administered 2019-04-23 – 2019-04-26 (×6): 90 mg via ORAL
  Filled 2019-04-23 (×7): qty 1

## 2019-04-23 NOTE — Progress Notes (Signed)
[facility] Progress Note   Subjective:  Seen in room. No acute c/o today. S/p new TDC yesterday. Denies CP or dyspnea. Unclear if outpatient hemodialysis slot arranged yet - will need to clarify with SW/case manager.  Objective Vitals:   04/22/19 2337 04/22/19 2359 04/23/19 0545 04/23/19 1201  BP: (!) 172/116 (!) 192/121 (!) 154/106 (!) 150/98  Pulse: 91 91 79 85  Resp: 19 16 17 18   Temp: 99 F (37.2 C) 97.9 F (36.6 C) 98.2 F (36.8 C) 98.4 F (36.9 C)  TempSrc: Oral Oral Oral Oral  SpO2: 99% 95% 93% 96%  Weight: 67.7 kg  67.9 kg   Height:       Physical Exam General: Well appearing young woman, NAD Heart: RRR; no murmur or tub Lungs: CTA anteriorly Abdomen: soft, non-tender, PD cath Extremities: No LE edema Dialysis Access: TDC in R chest - mildly tender (new)  Additional Objective Labs: Basic Metabolic Panel: Recent Labs  Lab 04/20/19 0650 04/22/19 0356 04/23/19 0354  NA 138 137 137  K 3.8 4.3 3.7  CL 102 97* 96*  CO2 22 24 28   GLUCOSE 140* 129* 96  BUN 46* 49* 24*  CREATININE 6.32* 6.09* 3.88*  CALCIUM 8.3* 8.6* 8.3*   Liver Function Tests: Recent Labs  Lab 04/19/19 0123  AST 14*  ALT 11  ALKPHOS 81  BILITOT 0.4  PROT 6.8  ALBUMIN 2.9*   CBC: Recent Labs  Lab 04/19/19 0123 04/19/19 0123 04/20/19 0650 04/20/19 0650 04/21/19 0218 04/22/19 0356 04/23/19 0354  WBC 8.5   < > 11.9*   < > 10.2 11.5* 11.9*  HGB 8.3*   < > 8.0*   < > 8.5* 8.8* 8.2*  HCT 25.6*   < > 24.4*   < > 26.2* 27.0* 25.8*  MCV 69.9*  --  69.9*  --  70.2* 70.5* 70.9*  PLT 454*   < > 478*   < > 396 367 254   < > = values in this interval not displayed.   Studies/Results: DG Lumbar Spine 2-3 Views  Result Date: 04/21/2019 CLINICAL DATA:  Back pain, no injury known. Pain x3 days EXAM: LUMBAR SPINE - 2-3 VIEW COMPARISON:  CT pelvis 10/04/2018 FINDINGS: Normal alignment. Vertebral body heights are maintained. Mild intervertebral disc space loss at L5-S1. Mild  lower lumbar arthropathy. Embolization coil noted in the left kidney. Peritoneal dialysis catheter in place. Sclerotic focus projecting over the left iliac bone as seen on prior CT may represent a bone island. IMPRESSION: No acute osseous abnormality in the lumbar spine. Degenerative disc disease L5-S1. Electronically Signed   By: Audie Pinto M.D.   On: 04/21/2019 15:13   IR Fluoro Guide CV Line Right  Result Date: 04/22/2019 INDICATION: 39 year old with end-stage renal disease. Patient's transitioning from peritoneal to hemodialysis. Patient needs a tunneled dialysis catheter. The non tunneled right jugular dialysis catheter was removed prior to this procedure. EXAM: FLUOROSCOPIC AND ULTRASOUND GUIDED PLACEMENT OF A TUNNELED DIALYSIS CATHETER Physician: Stephan Minister. Anselm Pancoast, MD MEDICATIONS: Ancef 2 g; The antibiotic was administered within an appropriate time interval prior to skin puncture. ANESTHESIA/SEDATION: Versed 2.0 mg IV; Fentanyl 100 mcg IV; Moderate Sedation Time:  22 minutes The patient was continuously monitored during the procedure by the interventional radiology nurse under my direct supervision. FLUOROSCOPY TIME:  Fluoroscopy Time: 12 seconds, 1 mGy COMPLICATIONS: None immediate. PROCEDURE: The procedure was explained to the patient. The risks and benefits of the procedure were discussed and the patient's questions were  addressed. Informed consent was obtained from the patient. The patient was placed supine on the interventional table. Ultrasound confirmed a patent right internal jugular vein. Ultrasound images were obtained for documentation. The right neck and chest was prepped and draped in a sterile fashion. The right neck was anesthetized with 1% lidocaine. Maximal barrier sterile technique was utilized including caps, mask, sterile gowns, sterile gloves, sterile drape, hand hygiene and skin antiseptic. A small incision was made with #11 blade scalpel. A 21 gauge needle directed into the right  internal jugular vein with ultrasound guidance. A micropuncture dilator set was placed. A 19 cm tip to cuff Palindrome catheter was selected. The skin below the right clavicle was anesthetized and a small incision was made with an #11 blade scalpel. A subcutaneous tunnel was formed to the vein dermatotomy site. The catheter was brought through the tunnel. The vein dermatotomy site was dilated to accommodate a peel-away sheath. The catheter was placed through the peel-away sheath and directed into the central venous structures. The tip of the catheter was placed at the SVC and right atrium junction with fluoroscopy. Fluoroscopic images were obtained for documentation. Both lumens were found to aspirate and flush well. The proper amount of heparin was flushed in both lumens. The vein dermatotomy site was closed using a single layer of absorbable suture and Dermabond. Gel-Foam placed in subcutaneous tract. The catheter was secured to the skin using Prolene suture. IMPRESSION: Successful placement of a right jugular tunneled dialysis catheter using ultrasound and fluoroscopic guidance. Electronically Signed   By: Markus Daft M.D.   On: 04/22/2019 10:44   IR US Guide Vasc Access Right  Result Date: 04/22/2019 INDICATION: 39 year old with end-stage renal disease. Patient's transitioning from peritoneal to hemodialysis. Patient needs a tunneled dialysis catheter. The non tunneled right jugular dialysis catheter was removed prior to this procedure. EXAM: FLUOROSCOPIC AND ULTRASOUND GUIDED PLACEMENT OF A TUNNELED DIALYSIS CATHETER Physician: Stephan Minister. Anselm Pancoast, MD MEDICATIONS: Ancef 2 g; The antibiotic was administered within an appropriate time interval prior to skin puncture. ANESTHESIA/SEDATION: Versed 2.0 mg IV; Fentanyl 100 mcg IV; Moderate Sedation Time:  22 minutes The patient was continuously monitored during the procedure by the interventional radiology nurse under my direct supervision. FLUOROSCOPY TIME:  Fluoroscopy  Time: 12 seconds, 1 mGy COMPLICATIONS: None immediate. PROCEDURE: The procedure was explained to the patient. The risks and benefits of the procedure were discussed and the patient's questions were addressed. Informed consent was obtained from the patient. The patient was placed supine on the interventional table. Ultrasound confirmed a patent right internal jugular vein. Ultrasound images were obtained for documentation. The right neck and chest was prepped and draped in a sterile fashion. The right neck was anesthetized with 1% lidocaine. Maximal barrier sterile technique was utilized including caps, mask, sterile gowns, sterile gloves, sterile drape, hand hygiene and skin antiseptic. A small incision was made with #11 blade scalpel. A 21 gauge needle directed into the right internal jugular vein with ultrasound guidance. A micropuncture dilator set was placed. A 19 cm tip to cuff Palindrome catheter was selected. The skin below the right clavicle was anesthetized and a small incision was made with an #11 blade scalpel. A subcutaneous tunnel was formed to the vein dermatotomy site. The catheter was brought through the tunnel. The vein dermatotomy site was dilated to accommodate a peel-away sheath. The catheter was placed through the peel-away sheath and directed into the central venous structures. The tip of the catheter was placed at the [facility]  and right atrium junction with fluoroscopy. Fluoroscopic images were obtained for documentation. Both lumens were found to aspirate and flush well. The proper amount of heparin was flushed in both lumens. The vein dermatotomy site was closed using a single layer of absorbable suture and Dermabond. Gel-Foam placed in subcutaneous tract. The catheter was secured to the skin using Prolene suture. IMPRESSION: Successful placement of a right jugular tunneled dialysis catheter using ultrasound and fluoroscopic guidance. Electronically Signed   By: Markus Daft M.D.   On: 04/22/2019  10:44   Medications:  . Chlorhexidine Gluconate Cloth  6 each Topical Daily  . cloNIDine  0.1 mg Transdermal Weekly  . darbepoetin (ARANESP) injection - DIALYSIS  40 mcg Intravenous Q Sat-HD  . heparin injection (subcutaneous)  5,000 Units Subcutaneous Q8H  . hydrALAZINE  100 mg Oral TID  . labetalol  300 mg Oral BID  . NIFEdipine  90 mg Oral BID  . predniSONE  20 mg Oral Q breakfast  . sodium chloride flush  3 mL Intravenous Once    Dialysis Orders: CAPD -> establishing new hemodialysis orders, dry weight.  Assessment/Plan: 1. Dyspnea/Pleuritic CP: Recent abx for pneumonia - repeat CXR with unchanged RLL consolidation and R effusion. Intermediate prob for PE per nuclear scan. Improved today. 2. ESRD: Transitioned from PD to HD. S/p [facility] 2/15. Transitioning to MWF sched - HD tomorrow. 3. HTN/volume:  BP high, titrating up meds  - continue to challenge down weight as tolerated. 4. Anemia: Hgb 8.2 - on Aranesp q Sat. Tsat 36%. 5. Secondary hyperparathyroidism:  Ca ok, will check Phos tomorrow. Not on binders/VDRA for now. 6. SLE: On prednisone QD   Veneta Penton, PA-C 04/23/2019, 12:27 PM  [facility] Pager: [phone]

## 2019-04-23 NOTE — Progress Notes (Signed)
Renal Navigator notified by Renal PA that patient needs OP HD seat as she is transitioning from PD to HD. Per patient, she goes to [facility] for Home Therapy. She states this is 30 minutes from her home. Navigator informed her that there is a closer clinic/[facility] within the same MD group. She would like to receive OP HD at [facility]. Renal Navigator spoke with [facility] HD Social Worker who has provided patient with a MWF schedule at 7:00am.  Renal Navigator met with patient to inform her of schedule. She is appreciative. She states she has her own transportation. She reports that she is still having some back pain that is being evaluated.  Patient is cleared by Nephrology, per Dr. Hollie Salk and cleared for discharge from an OP HD standpoint. Renal Navigator sent notification of this to Attending/Dr. Earnest Conroy and will follow for clearance from all disciplines to notify OP HD clinic of patient's start date.   Alphonzo Cruise, [place] Renal Navigator [phone]

## 2019-04-23 NOTE — Progress Notes (Signed)
Physical Therapy Treatment Patient Details Name: Latasha Drake MRN: [id number] DOB: 1981-01-23 Today's Date: 04/23/2019    History of Present Illness Pt adm with chest pain and acute hypoxic respiratory failure. Multifactorial with unclear etiology PNA vs lupus flare vs PE. Pt with heparin initiated 2/11 AM and therapeutic.  Pt to have HD catheter placed 2/12 with HD to follow. Pt with recent admission due to PNA. PMH - lupus, HTN, ESRD on PD.     PT Comments    Pt is independent with all functional mobility. Ambulated 350' without AD. Pt reports her energy level has improved since starting HD. No further acute care PT services indicated. Pt would benefit from HHPT for establishment of exercise program and community mobility. PT signing off.    Follow Up Recommendations  Home health PT     Equipment Recommendations  None recommended by PT    Recommendations for Other Services       Precautions / Restrictions Precautions Precautions: None    Mobility  Bed Mobility Overal bed mobility: Independent                Transfers Overall transfer level: Modified independent Equipment used: None                Ambulation/Gait Ambulation/Gait assistance: Supervision;Independent Gait Distance (Feet): 350 Feet Assistive device: None Gait Pattern/deviations: WFL(Within Functional Limits) Gait velocity: decreased Gait velocity interpretation: 1.31 - 2.62 ft/sec, indicative of limited community ambulator General Gait Details: Fatigue noted. No SOB. SpO2 > 90% on RA.   Stairs             Wheelchair Mobility    Modified Rankin (Stroke Patients Only)       Balance Overall balance assessment: No apparent balance deficits (not formally assessed)                                          Cognition Arousal/Alertness: Awake/alert Behavior During Therapy: WFL for tasks assessed/performed Overall Cognitive Status: Within Functional Limits for  tasks assessed                                        Exercises      General Comments        Pertinent Vitals/Pain Pain Assessment: No/denies pain    Home Living                      Prior Function            PT Goals (current goals can now be found in the care plan section) Acute Rehab PT Goals Patient Stated Goal: be able to do more Progress towards PT goals: Goals met/education completed, patient discharged from PT    Frequency           PT Plan Current plan remains appropriate    Co-evaluation              AM-PAC PT "6 Clicks" Mobility   Outcome Measure  Help needed turning from your back to your side while in a flat bed without using bedrails?: None Help needed moving from lying on your back to sitting on the side of a flat bed without using bedrails?: None Help needed moving to and from a bed to a chair (including a  wheelchair)?: None Help needed standing up from a chair using your arms (e.g., wheelchair or bedside chair)?: None Help needed to walk in hospital room?: None Help needed climbing 3-5 steps with a railing? : A Little 6 Click Score: 23    End of Session   Activity Tolerance: Patient tolerated treatment well Patient left: in bed;with call bell/phone within reach Nurse Communication: Mobility status PT Visit Diagnosis: Muscle weakness (generalized) (M62.81);Other abnormalities of gait and mobility (R26.89)     Time: 1005-1013 PT Time Calculation (min) (ACUTE ONLY): 8 min  Charges:  $Gait Training: 8-22 mins                     Lorrin Goodell, PT  Office # [phone] Pager [phone]    Lorriane Shire 04/23/2019, 11:22 AM

## 2019-04-23 NOTE — Progress Notes (Addendum)
PROGRESS NOTE    Latasha Drake  [id number]  DOB: 04/19/1980  PCP: Katherina Mires, MD Admit date:04/17/2019  Hospital course: 39 y.o.femalewith a history of SLE, ESRD on PD still making urine and follows nephrology in [place], recently placed on chronic prednisone, AOCD, and recent admission for RLL pneumonia discharged on augmentin 2/7--> only to return to the ED 2/11 with dyspnea and pleuritic chest pain. During prior admission, d-dimer was elevated and V/Q scan was intermediate probability for PE, though no anticoagulation started. On return now,d-dimer 13.31,she has no significant change on CXR showing significant RLL consolidation. She was started on IV heparin empirically with steroids/antibiotic. Nephrology consulted for PD and pulmonology consulted for continued respiratory complaints. Bilateral LE dopplers have resulted negative. She is being transitioned to HD now by renal. Due to ongoing intermittent chest pain c/o with pleuritic component at times, patient started on IV heparin. -COVID-19 was negative. HC complicated by uncontrolled BP with systolic [id number] and diastolic [id number]. Seen by renal--S/P Tunneled HD Cath placement on 2/15 and transitioning PD to HD.   Subjective: She is sleeping comfortably. No c/o chest pain.BP still uncontrolled with  SBP>170 , DBP >110.  Objective: Vitals:   04/22/19 2300 04/22/19 2337 04/22/19 2359 04/23/19 0545  BP: (!) 170/117 (!) 172/116 (!) 192/121 (!) 154/106  Pulse: 94 91 91 79  Resp:  19 16 17   Temp:  99 F (37.2 C) 97.9 F (36.6 C) 98.2 F (36.8 C)  TempSrc:  Oral Oral Oral  SpO2:  99% 95% 93%  Weight:  67.7 kg  67.9 kg  Height:        Intake/Output Summary (Last 24 hours) at 04/23/2019 1104 Last data filed at 04/23/2019 0000 Gross per 24 hour  Intake 185.03 ml  Output 3000 ml  Net -2814.97 ml   Filed Weights   04/22/19 1942 04/22/19 2337 04/23/19 0545  Weight: 70.7 kg 67.7 kg 67.9 kg    Physical Examination:  General exam:  Appears calm and comfortable  Respiratory system: Clear to auscultation. Respiratory effort normal. Cardiovascular system: S1 & S2 heard, RRR. No JVD, murmurs, rubs, gallops or clicks. No pedal edema. Gastrointestinal system: Abdomen is nondistended, soft and nontender. Normal bowel sounds heard. Central nervous system: Alert and oriented. No new focal neurological deficits. Extremities: No contractures, edema or joint deformities.  Skin: No rashes, lesions or ulcers Psychiatry: Judgement and insight appear normal. Mood & affect appropriate.   Data Reviewed: I have personally reviewed following labs and imaging studies  CBC: Recent Labs  Lab 04/19/19 0123 04/20/19 0650 04/21/19 0218 04/22/19 0356 04/23/19 0354  WBC 8.5 11.9* 10.2 11.5* 11.9*  HGB 8.3* 8.0* 8.5* 8.8* 8.2*  HCT 25.6* 24.4* 26.2* 27.0* 25.8*  MCV 69.9* 69.9* 70.2* 70.5* 70.9*  PLT 454* 478* 396 367 0000000   Basic Metabolic Panel: Recent Labs  Lab 04/18/19 0010 04/19/19 0123 04/20/19 0650 04/22/19 0356 04/23/19 0354  NA 137 135 138 137 137  K 3.3* 3.7 3.8 4.3 3.7  CL 98 97* 102 97* 96*  CO2 22 20* 22 24 28   GLUCOSE 109* 145* 140* 129* 96  BUN 70* 70* 46* 49* 24*  CREATININE 9.14* 9.23* 6.32* 6.09* 3.88*  CALCIUM 8.0* 8.5* 8.3* 8.6* 8.3*   GFR: Estimated Creatinine Clearance: 18.3 mL/min (A) (by C-G formula based on SCr of 3.88 mg/dL (H)). Liver Function Tests: Recent Labs  Lab 04/19/19 0123  AST 14*  ALT 11  ALKPHOS 81  BILITOT 0.4  PROT 6.8  ALBUMIN  2.9*   No results for input(s): LIPASE, AMYLASE in the last 168 hours. No results for input(s): AMMONIA in the last 168 hours. Coagulation Profile: Recent Labs  Lab 04/18/19 0010  INR 1.0   Cardiac Enzymes: No results for input(s): CKTOTAL, CKMB, CKMBINDEX, TROPONINI in the last 168 hours. BNP (last 3 results) No results for input(s): PROBNP in the last 8760 hours. HbA1C: No results for input(s): HGBA1C in the last 72 hours. CBG: No results  for input(s): GLUCAP in the last 168 hours. Lipid Profile: No results for input(s): CHOL, HDL, LDLCALC, TRIG, CHOLHDL, LDLDIRECT in the last 72 hours. Thyroid Function Tests: No results for input(s): TSH, T4TOTAL, FREET4, T3FREE, THYROIDAB in the last 72 hours. Anemia Panel: No results for input(s): VITAMINB12, FOLATE, FERRITIN, TIBC, IRON, RETICCTPCT in the last 72 hours. Sepsis Labs: Recent Labs  Lab 04/18/19 0404 04/18/19 0543 04/19/19 0123 04/20/19 0650  PROCALCITON  --   --  0.27 0.30  LATICACIDVEN 1.0 0.7  --   --     Recent Results (from the past 240 hour(s))  SARS CORONAVIRUS 2 (TAT 6-24 HRS) Nasopharyngeal Nasopharyngeal Swab     Status: None   Collection Time: 04/18/19  2:00 AM   Specimen: Nasopharyngeal Swab  Result Value Ref Range Status   SARS Coronavirus 2 NEGATIVE NEGATIVE Final    Comment: (NOTE) SARS-CoV-2 target nucleic acids are NOT DETECTED. The SARS-CoV-2 RNA is generally detectable in upper and lower respiratory specimens during the acute phase of infection. Negative results do not preclude SARS-CoV-2 infection, do not rule out co-infections with other pathogens, and should not be used as the sole basis for treatment or other patient management decisions. Negative results must be combined with clinical observations, patient history, and epidemiological information. The expected result is Negative. Fact Sheet for Patients: SugarRoll.be Fact Sheet for Healthcare Providers: https://www.woods-mathews.com/ This test is not yet approved or cleared by the [place] FDA and  has been authorized for detection and/or diagnosis of SARS-CoV-2 by FDA under an Emergency Use Authorization (EUA). This EUA will remain  in effect (meaning this test can be used) for the duration of the COVID-19 declaration under Section 56 4(b)(1) of the Act, 21 U.S.C. section 360bbb-3(b)(1), unless the authorization is terminated or revoked  sooner. Performed at [facility], [place] [address], [place], [place] [address]   Blood Culture (routine x 2)     Status: None   Collection Time: 04/18/19  4:04 AM   Specimen: BLOOD  Result Value Ref Range Status   Specimen Description BLOOD RIGHT ARM  Final   Special Requests   Final    BOTTLES DRAWN AEROBIC AND ANAEROBIC Blood Culture adequate volume   Culture   Final    NO GROWTH 5 DAYS Performed at [facility], [place] [address] [place], [place] [address]    Report Status 04/23/2019 FINAL  Final  Blood Culture (routine x 2)     Status: None   Collection Time: 04/18/19  5:40 AM   Specimen: BLOOD  Result Value Ref Range Status   Specimen Description BLOOD SITE NOT SPECIFIED  Final   Special Requests   Final    BOTTLES DRAWN AEROBIC AND ANAEROBIC Blood Culture results may not be optimal due to an inadequate volume of blood received in culture bottles   Culture   Final    NO GROWTH 5 DAYS Performed at [facility], [place] [address] [place], [place] [address]    Report Status  04/23/2019 FINAL  Final      Radiology Studies: DG Lumbar Spine 2-3 Views  Result Date: 04/21/2019 CLINICAL DATA:  Back pain, no injury known. Pain x3 days EXAM: LUMBAR SPINE - 2-3 VIEW COMPARISON:  CT pelvis 10/04/2018 FINDINGS: Normal alignment. Vertebral body heights are maintained. Mild intervertebral disc space loss at L5-S1. Mild lower lumbar arthropathy. Embolization coil noted in the left kidney. Peritoneal dialysis catheter in place. Sclerotic focus projecting over the left iliac bone as seen on prior CT may represent a bone island. IMPRESSION: No acute osseous abnormality in the lumbar spine. Degenerative disc disease L5-S1. Electronically Signed   By: Audie Pinto M.D.   On: 04/21/2019 15:13   IR Fluoro Guide CV Line Right  Result Date: 04/22/2019 INDICATION: 39 year old with end-stage renal disease. Patient's transitioning from peritoneal to hemodialysis. Patient needs a  tunneled dialysis catheter. The non tunneled right jugular dialysis catheter was removed prior to this procedure. EXAM: FLUOROSCOPIC AND ULTRASOUND GUIDED PLACEMENT OF A TUNNELED DIALYSIS CATHETER Physician: Stephan Minister. Anselm Pancoast, MD MEDICATIONS: Ancef 2 g; The antibiotic was administered within an appropriate time interval prior to skin puncture. ANESTHESIA/SEDATION: Versed 2.0 mg IV; Fentanyl 100 mcg IV; Moderate Sedation Time:  22 minutes The patient was continuously monitored during the procedure by the interventional radiology nurse under my direct supervision. FLUOROSCOPY TIME:  Fluoroscopy Time: 12 seconds, 1 mGy COMPLICATIONS: None immediate. PROCEDURE: The procedure was explained to the patient. The risks and benefits of the procedure were discussed and the patient's questions were addressed. Informed consent was obtained from the patient. The patient was placed supine on the interventional table. Ultrasound confirmed a patent right internal jugular vein. Ultrasound images were obtained for documentation. The right neck and chest was prepped and draped in a sterile fashion. The right neck was anesthetized with 1% lidocaine. Maximal barrier sterile technique was utilized including caps, mask, sterile gowns, sterile gloves, sterile drape, hand hygiene and skin antiseptic. A small incision was made with #11 blade scalpel. A 21 gauge needle directed into the right internal jugular vein with ultrasound guidance. A micropuncture dilator set was placed. A 19 cm tip to cuff Palindrome catheter was selected. The skin below the right clavicle was anesthetized and a small incision was made with an #11 blade scalpel. A subcutaneous tunnel was formed to the vein dermatotomy site. The catheter was brought through the tunnel. The vein dermatotomy site was dilated to accommodate a peel-away sheath. The catheter was placed through the peel-away sheath and directed into the central venous structures. The tip of the catheter was  placed at the SVC and right atrium junction with fluoroscopy. Fluoroscopic images were obtained for documentation. Both lumens were found to aspirate and flush well. The proper amount of heparin was flushed in both lumens. The vein dermatotomy site was closed using a single layer of absorbable suture and Dermabond. Gel-Foam placed in subcutaneous tract. The catheter was secured to the skin using Prolene suture. IMPRESSION: Successful placement of a right jugular tunneled dialysis catheter using ultrasound and fluoroscopic guidance. Electronically Signed   By: Markus Daft M.D.   On: 04/22/2019 10:44   IR US Guide Vasc Access Right  Result Date: 04/22/2019 INDICATION: 39 year old with end-stage renal disease. Patient's transitioning from peritoneal to hemodialysis. Patient needs a tunneled dialysis catheter. The non tunneled right jugular dialysis catheter was removed prior to this procedure. EXAM: FLUOROSCOPIC AND ULTRASOUND GUIDED PLACEMENT OF A TUNNELED DIALYSIS CATHETER Physician: Stephan Minister. Anselm Pancoast, MD MEDICATIONS: Ancef 2 g; The antibiotic  was administered within an appropriate time interval prior to skin puncture. ANESTHESIA/SEDATION: Versed 2.0 mg IV; Fentanyl 100 mcg IV; Moderate Sedation Time:  22 minutes The patient was continuously monitored during the procedure by the interventional radiology nurse under my direct supervision. FLUOROSCOPY TIME:  Fluoroscopy Time: 12 seconds, 1 mGy COMPLICATIONS: None immediate. PROCEDURE: The procedure was explained to the patient. The risks and benefits of the procedure were discussed and the patient's questions were addressed. Informed consent was obtained from the patient. The patient was placed supine on the interventional table. Ultrasound confirmed a patent right internal jugular vein. Ultrasound images were obtained for documentation. The right neck and chest was prepped and draped in a sterile fashion. The right neck was anesthetized with 1% lidocaine. Maximal  barrier sterile technique was utilized including caps, mask, sterile gowns, sterile gloves, sterile drape, hand hygiene and skin antiseptic. A small incision was made with #11 blade scalpel. A 21 gauge needle directed into the right internal jugular vein with ultrasound guidance. A micropuncture dilator set was placed. A 19 cm tip to cuff Palindrome catheter was selected. The skin below the right clavicle was anesthetized and a small incision was made with an #11 blade scalpel. A subcutaneous tunnel was formed to the vein dermatotomy site. The catheter was brought through the tunnel. The vein dermatotomy site was dilated to accommodate a peel-away sheath. The catheter was placed through the peel-away sheath and directed into the central venous structures. The tip of the catheter was placed at the SVC and right atrium junction with fluoroscopy. Fluoroscopic images were obtained for documentation. Both lumens were found to aspirate and flush well. The proper amount of heparin was flushed in both lumens. The vein dermatotomy site was closed using a single layer of absorbable suture and Dermabond. Gel-Foam placed in subcutaneous tract. The catheter was secured to the skin using Prolene suture. IMPRESSION: Successful placement of a right jugular tunneled dialysis catheter using ultrasound and fluoroscopic guidance. Electronically Signed   By: Markus Daft M.D.   On: 04/22/2019 10:44        Scheduled Meds: . Chlorhexidine Gluconate Cloth  6 each Topical Daily  . cloNIDine  0.1 mg Transdermal Weekly  . darbepoetin (ARANESP) injection - DIALYSIS  40 mcg Intravenous Q Sat-HD  . heparin injection (subcutaneous)  5,000 Units Subcutaneous Q8H  . hydrALAZINE  100 mg Oral TID  . labetalol  300 mg Oral BID  . NIFEdipine  90 mg Oral BID  . predniSONE  20 mg Oral Q breakfast  . sodium chloride flush  3 mL Intravenous Once   Continuous Infusions: . ceFEPime (MAXIPIME) IV 1 g (04/22/19 1110)    Assessment & Plan:    Acute hypoxemic respiratory failure: -Likely due to Fluid Overload in the setting of renal failure/uncontrolled HTN. Echo with preserved EF. CXR noted persistent unchanged right lower lobe infiltrate. CT on 04/12/2019 had indicated moderate to severe right lower lobe consolidation with bilateral pleural effusion, small pericardial effusion. Patient been on empiric abx day 5. Repeat echocardiogram 04/19/2019-pericardial effusion slightly smaller than 04/13/19 negative for tamponade.Patient admitted with empiric antibiotics and with IV heparin for probable PE on account of recent indeterminate V/Q report. PCCM consulted-respiratory problems felt to be 2/2 splinting / alveolar hypoventilation from pleuritic chest pain, and felt that there was a element of edema on CXR- recommended diuresis. Hypoventilation likely contributed to abnormal v/q report as well. Pt underwent HD 04/19/2019 with 2.9 L fluid removal with significant improvement in SOB, off O2,  with resolution of chest pains.Heparin stopped on 2/13 -patient however had recurrence of nonspecific chest pain and after discussions of nephrology-  heparin was resumed on 2/14--per Dr Elsworth Soho low probabily for PE and recommended d/c anticoag. Plan today is to D/C abx today and achieve better control of BP --continue dialysis for fluid overload.  HTN: Uncontrolled. Continue  increased hydralazine dose at 100mg  TID. Continue labetalol 300mg  BID and Nifedipine --increase to 90 mg from 60mg  BID. Added clonidine on 2/15. IV steroids could be contributing to uncontrolled BP --changed to home dose oral prednisone. Nephrology to advise on need to r/o other causes of refractory HTN.   ESRD on PD, lupus nephritis: - Nephrology consulted, will oversee dialysis and transitioning her to HD. She is not sure of her weekly schedule yet but tolerating HD well.  Avoid nephrotoxins.  SLE, history of lupus nephritis : Currently on IV solumedrol 40mg  bid since 2/11- transition to home  prednisone 20mg  if okay per pulmonary.CT on 04/12/2019 had indicated moderate to severe right lower lobe consolidation with bilateral pleural effusion, small pericardial effusion. Repeat echocardiogram 04/19/2019-negative for tamponade.  Anemia of chronic disease: Possibly contributing to symptoms. Denies bleeding, has required transfusions in the past. Monitor daily. Fe level 85 on iron studies, has reported hx chronic blood loss anemia.   DVT prophylaxis: heparin gtt --changed to sq Code Status: Full code Family / Patient Communication: d/w patient and answered all qs to satisfaction Disposition Plan: home when cleared by renal --OP dialysis set up and BP adequately controlled     LOS: 4 days    Time spent: 35 minutes    Guilford Shi, MD [facility] Pager in [place]  If 7PM-7AM, please contact night-coverage [email or web address] Password [facility] 04/23/2019, 11:04 AM

## 2019-04-24 ENCOUNTER — Inpatient Hospital Stay (HOSPITAL_COMMUNITY): Payer: Medicaid Other

## 2019-04-24 DIAGNOSIS — R071 Chest pain on breathing: Secondary | ICD-10-CM

## 2019-04-24 LAB — BASIC METABOLIC PANEL
Anion gap: 12 (ref 5–15)
BUN: 43 mg/dL — ABNORMAL HIGH (ref 6–20)
CO2: 25 mmol/L (ref 22–32)
Calcium: 8.3 mg/dL — ABNORMAL LOW (ref 8.9–10.3)
Chloride: 98 mmol/L (ref 98–111)
Creatinine, Ser: 6.29 mg/dL — ABNORMAL HIGH (ref 0.44–1.00)
GFR calc Af Amer: 9 mL/min — ABNORMAL LOW (ref >60)
GFR calc non Af Amer: 8 mL/min — ABNORMAL LOW (ref >60)
Glucose, Bld: 97 mg/dL (ref 70–99)
Potassium: 3.3 mmol/L — ABNORMAL LOW (ref 3.5–5.1)
Sodium: 135 mmol/L (ref 135–145)

## 2019-04-24 LAB — PROTEIN, PLEURAL OR PERITONEAL FLUID: Total protein, fluid: 3.2 g/dL

## 2019-04-24 LAB — LACTATE DEHYDROGENASE, PLEURAL OR PERITONEAL FLUID: LD, Fluid: 141 U/L — ABNORMAL HIGH (ref 3–23)

## 2019-04-24 MED ORDER — MORPHINE SULFATE (PF) 2 MG/ML IV SOLN
2.0000 mg | Freq: Four times a day (QID) | INTRAVENOUS | Status: DC | PRN
  Administered 2019-04-24: 2 mg via INTRAVENOUS
  Filled 2019-04-24: qty 1

## 2019-04-24 MED ORDER — OXYCODONE HCL 5 MG PO TABS
ORAL_TABLET | ORAL | Status: AC
  Filled 2019-04-24: qty 1

## 2019-04-24 MED ORDER — HEPARIN SODIUM (PORCINE) 1000 UNIT/ML IJ SOLN
INTRAMUSCULAR | Status: AC
  Administered 2019-04-24: 3200 [IU]
  Filled 2019-04-24: qty 4

## 2019-04-24 NOTE — Procedures (Signed)
Thoracentesis Procedure Note  Pre-operative Diagnosis: Moderate pleural effusion   Post-operative Diagnosis: same  Indications: Moderate pleural effusion   Procedure Details  Consent: Informed consent was obtained. Risks of the procedure were discussed including: infection, bleeding, pain, pneumothorax.  Under sterile conditions the patient was positioned. Betadine solution and sterile drapes were utilized.  1% plain lidocaine was used to anesthetize the appropriate rib space for the most optimal fluid pocket assess on ultrasound. Fluid was obtained without any difficulties and minimal blood loss.  A dressing was applied to the wound and wound care instructions were provided.   Findings 600 ml of Pink tinged pleural fluid was obtained. A sample was sent to Pathology for cytogenetics, flow, and cell counts, as well as for infection analysis.  Complications:  None; patient tolerated the procedure well.          Condition: stable  Plan A follow up chest x-ray was ordered. Bed Rest for 1 hours. Tylenol 650 mg. for pain.  Johnsie Cancel, NP-C [facility] Contact / Pager information can be found on Amion  04/24/2019, 5:23 PM

## 2019-04-24 NOTE — Discharge Summary (Signed)
Physician Discharge Summary  Latasha Drake [id number] DOB: 01-02-1981 DOA: 04/17/2019  PCP: Latasha Mires, MD  Admit date: 04/17/2019 Discharge date: 04/26/2019  Time spent: 45 minutes  Recommendations for Outpatient Follow-up:  Patient will be discharged to home with home health.  Patient will need to follow up with primary care provider within one week of discharge.  Continue hemodialysis as scheduled. Follow up with rheumatologist. Patient should continue medications as prescribed.  Patient should follow a renal/carb modified diet.   Discharge Diagnoses:  Acute hypoxemic respiratory failure Uncontrolled hypertension ESRD/lupus nephritis SLE, history of lupus nephritis Anemia of chronic disease Hypokalemia Back pain/pleuritic pain  Discharge Condition: Stable  Diet recommendation: renal/carb modified  Filed Weights   04/24/19 1150 04/25/19 0626 04/26/19 0620  Weight: 64.4 kg 64 kg 63.7 kg    History of present illness:  On 04/18/2019 by Dr. Jani Drake Latasha Drake  is a 39 y.o. female,  39 y.o.female,w hypertension, lupus, anemia, small pericardial effusion, ESRD on peritoneal dialysis,w recent admissionfor right lower lung pneumonia, presents with c/o pleuritic chest pain.  Pt had VQ on prior admission intermediate risk but on the lower end.  Pt states that the chest pain is right and left sided, "sharp" worse with inspiration.  Pt has dyspnea, but pox at rest is 98% , but on walking per RN is 80's.   Pt denies fever, chills, cough w productive sputum, palp, n/v, abd pain, diarrhea, brbpr.   Hospital Course:  Acute hypoxemic respiratory failure -Likely secondary to fluid overload in the setting of renal failure uncontrolled hypertension -Initial chest x-ray reviewed and showed right lower lobe infiltrate bilateral pleural effusions -CT 04/12/2019 indicated moderate to severe right lower lobe consolidation bilateral pleural effusions, small pericardial effusion -Patient  placed on empiric antibiotics for 5 days -Echocardiogram 04/19/19 showed pericardial effusion smaller than 04/13/2019, negative for tamponade.  EF 60 to 65%.  No regional wall motion abnormalities.  LV diastolic parameters normal -Was initially placed on IV heparin for probable PE or immediate VQ scan; lower extremity Doppler unremarkable for DVT -PCCM consulted and appreciated, felt respiratory problems felt to be secondary to splinting/alveolar hypoventilation from pleuritic chest pain and felt there is edema on the x-ray, recommended diuresis -Hypoventilation likely contributed abnormal V/Q report as well -Patient underwent hemodialysis on 04/19/2019 with 2.9liters of fluid removal and significant improvement of her shortness of breath. She was then off of supplemental oxygen -Heparin was then discontinued -Shortness of breath continues to improve with volume removal-patient was having some pleuritic pain -Chest x-ray obtained today does show improvement of pleural effusions, although does continue to have right persistent effusion.  -PCCM consulted, s/p R US guided thoracentesis, yielding 600cc pink tinged fluid- pending labs -Discussed with pulmonology, fluid appears transudative. Possibly related to lupus.  -As below, placed patient on topical NSAIDs, voltaren gel. Continue prednisone -Discussed with nephrology, possibly consider plaquenil   Uncontrolled hypertension -Continue hydralazine 100 mg 3 times daily, labetalol 300 mg twice daily, nifedipine 90 mg BID, clonidine 0.1mg  patch weekly -Patient was initially placed on IV steroids which may have been contributing to her uncontrolled hypertension -Nephrology feels her blood pressure will continue to improve with dialysis  ESRD/lupus nephritis -Patient was peritoneal dialysis however has now been transitioned to hemodialysis -Nephrology consulted and appreciated -Patient does have an outpatient dialysis slot in [facility] with [facility] on  Monday Wednesday Friday at 7 AM  SLE, history of lupus nephritis -Patient currently on oral dose of prednisone, 20 mg -may need  plaquenil, see discussion above  Anemia of chronic disease -Patient has adequate iron -Has history of chronic blood loss anemia in the past requiring transfusions -Currently denies any melena or hematochezia or hematemesis -Hemoglobin stable  Hypokalemia -Resolved with hemodialysis  Back pain/pleuritic pain -Improving -X-ray showed degenerative disc disease from L5-S1 -Continue pain control -possible referred from pneumonia- complains it is on the right side and after thoracentesis feels sharp pain -Continue Kpad, voltaren gel (discussed with nephro)  Procedures: Echocardiogram Lower extremity Doppler US guided thoracentesis  Consultations: Nephrology PCCM  Discharge Exam: Vitals:   04/26/19 0620 04/26/19 0742  BP: (!) 140/95 115/77  Pulse: 79 79  Resp: 17   Temp: 98.1 F (36.7 C)   SpO2: 93%      General: Well developed, well nourished, NAD, appears stated age  HEENT: NCAT, mucous membranes moist.  Cardiovascular: S1 S2 auscultated, RRR  Respiratory: Diminished but clear  Abdomen: Soft, nontender, nondistended, + bowel sounds  Extremities: warm dry without cyanosis clubbing or edema  Neuro: AAOx3, nonfocal   Psych: Appropriate mood and affect, pleasant  Discharge Instructions Discharge Instructions    Discharge instructions   Complete by: As directed    Patient will be discharged to home with home health.  Patient will need to follow up with primary care provider within one week of discharge.  Continue hemodialysis as scheduled. Follow up with rheumatologist. Patient should continue medications as prescribed.  Patient should follow a renal/carb modified diet.     Allergies as of 04/26/2019      Reactions   Lisinopril Swelling      Medication List    STOP taking these medications   amoxicillin-clavulanate 875-125 MG  tablet Commonly known as: AUGMENTIN   azithromycin 250 MG tablet Commonly known as: ZITHROMAX   feeding supplement (PRO-STAT SUGAR FREE 64) Liqd   furosemide 40 MG tablet Commonly known as: LASIX   gentamicin cream 0.1 % Commonly known as: GARAMYCIN     TAKE these medications   cloNIDine 0.1 mg/24hr patch Commonly known as: CATAPRES - Dosed in mg/24 hr Place 1 patch (0.1 mg total) onto the skin once a week. Start taking on: April 29, 2019   cyclobenzaprine 10 MG tablet Commonly known as: FLEXERIL Take 10 mg by mouth 3 (three) times daily as needed for muscle spasms.   diclofenac Sodium 1 % Gel Commonly known as: VOLTAREN Apply 4 g topically 4 (four) times daily.   hydrALAZINE 100 MG tablet Commonly known as: APRESOLINE Take 1 tablet (100 mg total) by mouth 3 (three) times daily. What changed:   medication strength  how much to take  when to take this   labetalol 300 MG tablet Commonly known as: NORMODYNE Take 300 mg by mouth 2 (two) times daily.   NIFEdipine 90 MG 24 hr tablet Commonly known as: PROCARDIA XL/NIFEDICAL-XL Take 1 tablet (90 mg total) by mouth 2 (two) times daily. What changed:   medication strength  how much to take   ondansetron 4 MG tablet Commonly known as: Zofran Take 1 tablet (4 mg total) by mouth every 8 (eight) hours as needed for nausea or vomiting.   oxyCODONE 5 MG immediate release tablet Commonly known as: Oxy IR/ROXICODONE Take 1 tablet (5 mg total) by mouth every 4 (four) hours as needed for breakthrough pain.   pantoprazole 40 MG tablet Commonly known as: PROTONIX Take 40 mg by mouth daily as needed (indigestion).   paragard intrauterine copper Iud IUD 1 Intra Uterine Device by Intrauterine  route once.   predniSONE 20 MG tablet Commonly known as: DELTASONE Take 20 mg by mouth daily.   Vitamin D (Ergocalciferol) 1.25 MG (50000 UNIT) Caps capsule Commonly known as: DRISDOL Take 50,000 Units by mouth every Sunday.  Take on Sun.      Allergies  Allergen Reactions  . Lisinopril Swelling   Follow-up Information    Latasha Mires, MD. Schedule an appointment as soon as possible for a visit in 1 week(s).   Specialty: Family Medicine Why: Hospital follow up Contact information: [address] [place] [address] [phone]        Rheumatology. Schedule an appointment as soon as possible for a visit in 1 week(s).   Why: Hospital follow up           The results of significant diagnostics from this hospitalization (including imaging, microbiology, ancillary and laboratory) are listed below for reference.    Significant Diagnostic Studies: DG Chest 2 View  Result Date: 04/24/2019 CLINICAL DATA:  Shortness of breath EXAM: CHEST - 2 VIEW COMPARISON:  04/18/2019 FINDINGS: No significant change in moderate right, small left pleural effusions with associated atelectasis or consolidation. Interval placement of right chest large bore multi lumen vascular catheter. Cardiomegaly. IMPRESSION: 1. No significant change in moderate right, small left pleural effusions with associated atelectasis or consolidation. 2. Interval placement of right chest large bore multi lumen vascular catheter. 3.  Cardiomegaly. Electronically Signed   By: Eddie Candle M.D.   On: 04/24/2019 14:43   DG Chest 2 View  Result Date: 04/18/2019 CLINICAL DATA:  Chest pain EXAM: CHEST - 2 VIEW COMPARISON:  04/12/2019 FINDINGS: Persistent right lower lobe consolidation with small right pleural effusion unchanged. Left lung is clear. IMPRESSION: Unchanged right lower lobe consolidation and small right pleural effusion. Electronically Signed   By: Ulyses Jarred M.D.   On: 04/18/2019 00:35   DG Chest 2 View  Result Date: 04/12/2019 CLINICAL DATA:  Chest pain, shortness of breath, dialysis EXAM: CHEST - 2 VIEW COMPARISON:  10/04/2018 FINDINGS: Cardiomegaly. Moderate to large right pleural effusion. Small left pleural  effusion. The visualized skeletal structures are unremarkable. IMPRESSION: Cardiomegaly with moderate to large right pleural effusion and small left pleural effusion. Electronically Signed   By: Eddie Candle M.D.   On: 04/12/2019 20:01   DG Lumbar Spine 2-3 Views  Result Date: 04/21/2019 CLINICAL DATA:  Back pain, no injury known. Pain x3 days EXAM: LUMBAR SPINE - 2-3 VIEW COMPARISON:  CT pelvis 10/04/2018 FINDINGS: Normal alignment. Vertebral body heights are maintained. Mild intervertebral disc space loss at L5-S1. Mild lower lumbar arthropathy. Embolization coil noted in the left kidney. Peritoneal dialysis catheter in place. Sclerotic focus projecting over the left iliac bone as seen on prior CT may represent a bone island. IMPRESSION: No acute osseous abnormality in the lumbar spine. Degenerative disc disease L5-S1. Electronically Signed   By: Audie Pinto M.D.   On: 04/21/2019 15:13   CT Chest Wo Contrast  Result Date: 04/12/2019 CLINICAL DATA:  Chest pain and shortness of breath. EXAM: CT CHEST WITHOUT CONTRAST TECHNIQUE: Multidetector CT imaging of the chest was performed following the standard protocol without IV contrast. COMPARISON:  May 28, 2015 FINDINGS: Cardiovascular: No significant vascular findings. Normal heart size. There is a small pericardial effusion which represents a new finding when compared to the prior study. Mediastinum/Nodes: No enlarged mediastinal or axillary lymph nodes. Lungs/Pleura: A moderate sized area of consolidation is seen within the right lower lobe.  A moderate size right pleural effusion is seen. A small left pleural effusion is also noted. There is no evidence of a pneumothorax. Upper Abdomen: A moderate amount of free fluid is seen within the upper abdomen. Musculoskeletal: No chest wall mass or suspicious bone lesions identified. IMPRESSION: 1. Moderate severity right lower lobe consolidation. While this is likely secondary to a combination of atelectasis  and/or infiltrate, an underlying neoplastic process cannot be excluded. Follow-up to resolution is recommended. 2. Bilateral pleural effusions, right greater than left. 3. Small pericardial effusion which represents a new finding compared to the prior study dated May 28, 2015. 4. Moderate amount of intra-abdominal free fluid. Electronically Signed   By: Virgina Norfolk M.D.   On: 04/12/2019 22:09   NM Pulmonary Perfusion  Result Date: 04/13/2019 CLINICAL DATA:  39 year old female with chest pain and shortness of breath. EXAM: NUCLEAR MEDICINE PERFUSION LUNG SCAN TECHNIQUE: Perfusion images were obtained in multiple projections after intravenous injection of radiopharmaceutical. Ventilation scans intentionally deferred if perfusion scan and chest x-ray adequate for interpretation during COVID 19 epidemic. RADIOPHARMACEUTICALS:  1.64 mCi Tc-18m MAA IV COMPARISON:  04/12/2019 chest CT and chest radiograph FINDINGS: Very poor perfusion to the RIGHT LOWER lung is noted compatible with consolidation noted on recent CT. Slightly decreased activity of the remainder of the RIGHT lung is noted, specially on posterior views compatible with pleural effusion as recently identified. No other perfusion abnormalities are noted. IMPRESSION: Indeterminate probability for pulmonary embolus (20-79%), but given recent CT and chest x-ray findings, on the LOWER end of this range. Electronically Signed   By: Margarette Canada M.D.   On: 04/13/2019 09:04   IR Fluoro Guide CV Line Right  Result Date: 04/22/2019 INDICATION: 39 year old with end-stage renal disease. Patient's transitioning from peritoneal to hemodialysis. Patient needs a tunneled dialysis catheter. The non tunneled right jugular dialysis catheter was removed prior to this procedure. EXAM: FLUOROSCOPIC AND ULTRASOUND GUIDED PLACEMENT OF A TUNNELED DIALYSIS CATHETER Physician: Stephan Minister. Anselm Pancoast, MD MEDICATIONS: Ancef 2 g; The antibiotic was administered within an appropriate  time interval prior to skin puncture. ANESTHESIA/SEDATION: Versed 2.0 mg IV; Fentanyl 100 mcg IV; Moderate Sedation Time:  22 minutes The patient was continuously monitored during the procedure by the interventional radiology nurse under my direct supervision. FLUOROSCOPY TIME:  Fluoroscopy Time: 12 seconds, 1 mGy COMPLICATIONS: None immediate. PROCEDURE: The procedure was explained to the patient. The risks and benefits of the procedure were discussed and the patient's questions were addressed. Informed consent was obtained from the patient. The patient was placed supine on the interventional table. Ultrasound confirmed a patent right internal jugular vein. Ultrasound images were obtained for documentation. The right neck and chest was prepped and draped in a sterile fashion. The right neck was anesthetized with 1% lidocaine. Maximal barrier sterile technique was utilized including caps, mask, sterile gowns, sterile gloves, sterile drape, hand hygiene and skin antiseptic. A small incision was made with #11 blade scalpel. A 21 gauge needle directed into the right internal jugular vein with ultrasound guidance. A micropuncture dilator set was placed. A 19 cm tip to cuff Palindrome catheter was selected. The skin below the right clavicle was anesthetized and a small incision was made with an #11 blade scalpel. A subcutaneous tunnel was formed to the vein dermatotomy site. The catheter was brought through the tunnel. The vein dermatotomy site was dilated to accommodate a peel-away sheath. The catheter was placed through the peel-away sheath and directed into the central venous structures. The tip  of the catheter was placed at the [facility] and right atrium junction with fluoroscopy. Fluoroscopic images were obtained for documentation. Both lumens were found to aspirate and flush well. The proper amount of heparin was flushed in both lumens. The vein dermatotomy site was closed using a single layer of absorbable suture and  Dermabond. Gel-Foam placed in subcutaneous tract. The catheter was secured to the skin using Prolene suture. IMPRESSION: Successful placement of a right jugular tunneled dialysis catheter using ultrasound and fluoroscopic guidance. Electronically Signed   By: Markus Daft M.D.   On: 04/22/2019 10:44   IR Fluoro Guide CV Line Right  Result Date: 04/19/2019 INDICATION: 39 year old with end-stage renal disease. The patient has a peritoneal dialysis catheter but needs urgent hemodialysis. Request for hemodialysis catheter. EXAM: FLUOROSCOPIC AND ULTRASOUND GUIDED PLACEMENT OF A NON-TUNNELED DIALYSIS CATHETER Physician: Stephan Minister. Henn, MD MEDICATIONS: None ANESTHESIA/SEDATION: None FLUOROSCOPY TIME:  Fluoroscopy Time: 6 seconds, 2 mGy COMPLICATIONS: None immediate. PROCEDURE: The procedure was explained to the patient. The risks and benefits of the procedure were discussed and the patient's questions were addressed. Informed consent was obtained from the patient. The patient was placed supine on the interventional table. Ultrasound confirmed a patent right internal jugular vein. Ultrasound images were obtained for documentation. The right neck was prepped and draped in a sterile fashion. The right neck was anesthetized with 1% lidocaine. Maximal barrier sterile technique was utilized including caps, mask, sterile gowns, sterile gloves, sterile drape, hand hygiene and skin antiseptic. A small incision was made with #11 blade scalpel. A 21 gauge needle directed into the right internal jugular vein with ultrasound guidance. A micropuncture dilator set was placed. A 16 cm Mahurkar catheter was selected. The catheter was advanced over a wire and positioned at the superior cavoatrial junction. Fluoroscopic images were obtained for documentation. Both dialysis lumens were found to aspirate and flush well. The proper amount of heparin was flushed in both lumens. The central venous lumen was flushed with normal saline. Catheter  was sutured to skin. FINDINGS: Catheter tip at the superior cavoatrial junction. IMPRESSION: Successful placement of a right jugular non-tunneled dialysis catheter using ultrasound and fluoroscopic guidance. Electronically Signed   By: Markus Daft M.D.   On: 04/19/2019 15:52   IR US Guide Vasc Access Right  Result Date: 04/22/2019 INDICATION: 39 year old with end-stage renal disease. Patient's transitioning from peritoneal to hemodialysis. Patient needs a tunneled dialysis catheter. The non tunneled right jugular dialysis catheter was removed prior to this procedure. EXAM: FLUOROSCOPIC AND ULTRASOUND GUIDED PLACEMENT OF A TUNNELED DIALYSIS CATHETER Physician: Stephan Minister. Anselm Pancoast, MD MEDICATIONS: Ancef 2 g; The antibiotic was administered within an appropriate time interval prior to skin puncture. ANESTHESIA/SEDATION: Versed 2.0 mg IV; Fentanyl 100 mcg IV; Moderate Sedation Time:  22 minutes The patient was continuously monitored during the procedure by the interventional radiology nurse under my direct supervision. FLUOROSCOPY TIME:  Fluoroscopy Time: 12 seconds, 1 mGy COMPLICATIONS: None immediate. PROCEDURE: The procedure was explained to the patient. The risks and benefits of the procedure were discussed and the patient's questions were addressed. Informed consent was obtained from the patient. The patient was placed supine on the interventional table. Ultrasound confirmed a patent right internal jugular vein. Ultrasound images were obtained for documentation. The right neck and chest was prepped and draped in a sterile fashion. The right neck was anesthetized with 1% lidocaine. Maximal barrier sterile technique was utilized including caps, mask, sterile gowns, sterile gloves, sterile drape, hand hygiene and skin antiseptic. A small  incision was made with #11 blade scalpel. A 21 gauge needle directed into the right internal jugular vein with ultrasound guidance. A micropuncture dilator set was placed. A 19 cm tip to  cuff Palindrome catheter was selected. The skin below the right clavicle was anesthetized and a small incision was made with an #11 blade scalpel. A subcutaneous tunnel was formed to the vein dermatotomy site. The catheter was brought through the tunnel. The vein dermatotomy site was dilated to accommodate a peel-away sheath. The catheter was placed through the peel-away sheath and directed into the central venous structures. The tip of the catheter was placed at the SVC and right atrium junction with fluoroscopy. Fluoroscopic images were obtained for documentation. Both lumens were found to aspirate and flush well. The proper amount of heparin was flushed in both lumens. The vein dermatotomy site was closed using a single layer of absorbable suture and Dermabond. Gel-Foam placed in subcutaneous tract. The catheter was secured to the skin using Prolene suture. IMPRESSION: Successful placement of a right jugular tunneled dialysis catheter using ultrasound and fluoroscopic guidance. Electronically Signed   By: Markus Daft M.D.   On: 04/22/2019 10:44   IR US Guide Vasc Access Right  Result Date: 04/19/2019 INDICATION: 39 year old with end-stage renal disease. The patient has a peritoneal dialysis catheter but needs urgent hemodialysis. Request for hemodialysis catheter. EXAM: FLUOROSCOPIC AND ULTRASOUND GUIDED PLACEMENT OF A NON-TUNNELED DIALYSIS CATHETER Physician: Stephan Minister. Henn, MD MEDICATIONS: None ANESTHESIA/SEDATION: None FLUOROSCOPY TIME:  Fluoroscopy Time: 6 seconds, 2 mGy COMPLICATIONS: None immediate. PROCEDURE: The procedure was explained to the patient. The risks and benefits of the procedure were discussed and the patient's questions were addressed. Informed consent was obtained from the patient. The patient was placed supine on the interventional table. Ultrasound confirmed a patent right internal jugular vein. Ultrasound images were obtained for documentation. The right neck was prepped and draped in a  sterile fashion. The right neck was anesthetized with 1% lidocaine. Maximal barrier sterile technique was utilized including caps, mask, sterile gowns, sterile gloves, sterile drape, hand hygiene and skin antiseptic. A small incision was made with #11 blade scalpel. A 21 gauge needle directed into the right internal jugular vein with ultrasound guidance. A micropuncture dilator set was placed. A 16 cm Mahurkar catheter was selected. The catheter was advanced over a wire and positioned at the superior cavoatrial junction. Fluoroscopic images were obtained for documentation. Both dialysis lumens were found to aspirate and flush well. The proper amount of heparin was flushed in both lumens. The central venous lumen was flushed with normal saline. Catheter was sutured to skin. FINDINGS: Catheter tip at the superior cavoatrial junction. IMPRESSION: Successful placement of a right jugular non-tunneled dialysis catheter using ultrasound and fluoroscopic guidance. Electronically Signed   By: Markus Daft M.D.   On: 04/19/2019 15:52   DG CHEST PORT 1 VIEW  Result Date: 04/24/2019 CLINICAL DATA:  Post RIGHT thoracentesis. EXAM: PORTABLE CHEST 1 VIEW 5:28 p.m.: COMPARISON:  Portable chest x-ray earlier same day at 2:02 p.m. and previously. FINDINGS: No evidence of pneumothorax after RIGHT thoracentesis. Resolution of the RIGHT pleural effusion. Improved aeration at the RIGHT lung base with only minimal atelectasis persisting. Stable small LEFT pleural effusion and mild LEFT basilar atelectasis. No new pulmonary parenchymal abnormalities. RIGHT jugular dialysis catheter tips project at or near the cavoatrial junction, unchanged IMPRESSION: 1. No pneumothorax after RIGHT thoracentesis. 2. Resolution of the RIGHT pleural effusion. 3. Improved aeration at the RIGHT lung base with only  minimal atelectasis persisting. 4. Stable small LEFT pleural effusion and mild LEFT basilar atelectasis. Electronically Signed   By: Evangeline Dakin M.D.   On: 04/24/2019 19:34   ECHOCARDIOGRAM COMPLETE  Result Date: 04/19/2019    ECHOCARDIOGRAM REPORT   Patient Name:   Latasha Drake Date of Exam: 04/19/2019 Medical Rec #:  [id number]   Height:       64.0 in Accession #:    [id number]  Weight:       167.2 lb Date of Birth:  05-05-1980  BSA:          1.81 m Patient Age:    68 years    BP:           165/106 mmHg Patient Gender: F           HR:           105 bpm. Exam Location:  Inpatient Procedure: 2D Echo, Cardiac Doppler and Color Doppler Indications:    Pericardial effusion 423.9  History:        Patient has prior history of Echocardiogram examinations, most                 recent 04/13/2019. Signs/Symptoms:Chest Pain; Risk                 Factors:Hypertension and Current Smoker. Pericardial effusion.                 Lupus.  Sonographer:    Vickie Epley RDCS Referring Phys: 15 [place]  1. No evidence of tamponade IVC is dilated but collapses with inspiration No MV inflow change with respiration but there are changes with TV inflow.  2. Left ventricular ejection fraction, by estimation, is 60 to 65%. The left ventricle has normal function. The left ventricle has no regional wall motion abnormalities. Left ventricular diastolic parameters were normal.  3. Right ventricular systolic function is normal. The right ventricular size is normal. There is normal pulmonary artery systolic pressure.  4. Left atrial size was moderately dilated.  5. Effusion seems slightly smaller than 04/13/19 not quite circumferential and no convincing evidence of tamponade . The pericardial effusion is posterior to the left ventricle , lateral to the left ventricle and posterior to the left ventricle and the left atrium.  6. The mitral valve is normal in structure and function. Trivial mitral valve regurgitation. No evidence of mitral stenosis.  7. The aortic valve is normal in structure and function. Aortic valve regurgitation is not visualized. No aortic  stenosis is present. FINDINGS  Left Ventricle: Left ventricular ejection fraction, by estimation, is 60 to 65%. The left ventricle has normal function. The left ventricle has no regional wall motion abnormalities. The left ventricular internal cavity size was normal in size. There is  no left ventricular hypertrophy. Left ventricular diastolic parameters were normal. Right Ventricle: The right ventricular size is normal. No increase in right ventricular wall thickness. Right ventricular systolic function is normal. There is normal pulmonary artery systolic pressure. The tricuspid regurgitant velocity is 2.06 m/s, and  with an assumed right atrial pressure of 3 mmHg, the estimated right ventricular systolic pressure is 0000000 mmHg. Left Atrium: Left atrial size was moderately dilated. Right Atrium: Right atrial size was normal in size. Pericardium: Effusion seems slightly smaller than 04/13/19 not quite circumferential and no convincing evidence of tamponade. A small pericardial effusion is present. The pericardial effusion is posterior to the left ventricle , lateral to the left ventricle and posterior  to the left ventricle and the left atrium. Mitral Valve: The mitral valve is normal in structure and function. There is mild thickening of the mitral valve leaflet(s). Normal mobility of the mitral valve leaflets. Trivial mitral valve regurgitation. No evidence of mitral valve stenosis. Tricuspid Valve: The tricuspid valve is normal in structure. Tricuspid valve regurgitation is not demonstrated. No evidence of tricuspid stenosis. Aortic Valve: The aortic valve is normal in structure and function. Aortic valve regurgitation is not visualized. No aortic stenosis is present. Pulmonic Valve: The pulmonic valve was normal in structure. Pulmonic valve regurgitation is not visualized. No evidence of pulmonic stenosis. Aorta: The aortic root is normal in size and structure. IAS/Shunts: No atrial level shunt detected by color  flow Doppler. Additional Comments: No evidence of tamponade IVC is dilated but collapses with inspiration No MV inflow change with respiration but there are changes with TV inflow.  LEFT VENTRICLE PLAX 2D LVIDd:         5.23 cm  Diastology LVIDs:         3.30 cm  LV e' lateral:   10.70 cm/s LV PW:         0.97 cm  LV E/e' lateral: 12.2 LV IVS:        0.97 cm  LV e' medial:    6.74 cm/s LVOT diam:     1.60 cm  LV E/e' medial:  19.4 LV SV:         57.91 ml LV SV Index:   46.51 LVOT Area:     2.01 cm  RIGHT VENTRICLE RV S prime:     14.90 cm/s TAPSE (M-mode): 2.4 cm LEFT ATRIUM             Index       RIGHT ATRIUM           Index LA diam:        4.50 cm 2.48 cm/m  RA Area:     12.80 cm LA Vol (A2C):   53.6 ml 29.56 ml/m RA Volume:   32.40 ml  17.87 ml/m LA Vol (A4C):   86.7 ml 47.82 ml/m LA Biplane Vol: 71.3 ml 39.33 ml/m  AORTIC VALVE LVOT Vmax:   189.00 cm/s LVOT Vmean:  115.000 cm/s LVOT VTI:    0.288 m  AORTA Ao Root diam: 3.30 cm MITRAL VALVE                TRICUSPID VALVE MV Area (PHT): 4.96 cm     TR Peak grad:   17.0 mmHg MV Decel Time: 153 msec     TR Vmax:        206.00 cm/s MV E velocity: 131.00 cm/s MV A velocity: 148.00 cm/s  SHUNTS MV E/A ratio:  0.89         Systemic VTI:  0.29 m                             Systemic Diam: 1.60 cm Jenkins Rouge MD Electronically signed by Jenkins Rouge MD Signature Date/Time: 04/19/2019/11:32:42 AM    Final    ECHOCARDIOGRAM COMPLETE  Result Date: 04/13/2019   ECHOCARDIOGRAM REPORT   Patient Name:   Latasha Drake Date of Exam: 04/13/2019 Medical Rec #:  [id number]   Height:       64.0 in Accession #:    [id number]  Weight:       160.0 lb Date of Birth:  08/04/1980  BSA:  1.78 m Patient Age:    38 years    BP:           165/95 mmHg Patient Gender: F           HR:           102 bpm. Exam Location:  Inpatient Procedure: 2D Echo Indications:    Pericardial effusion 423.9 / I31.3  History:        Patient has prior history of Echocardiogram examinations, most                  recent 10/04/2018. Pericardial Disease, Signs/Symptoms:Chest                 Pain; Risk Factors:Hypertension and Current Smoker. ESRD, Lupus,                 Acute renal failure superimposed on chronic kidney disease ,                 Pneumonia.  Sonographer:    Leavy Cella Referring Phys: [place]  1. Left ventricular ejection fraction, by visual estimation, is 60 to 65%. The left ventricle has normal function. Left ventricular septal wall thickness was moderately increased. Moderately increased left ventricular posterior wall thickness. There is no left ventricular hypertrophy.  2. The left ventricle has no regional wall motion abnormalities.  3. Left ventricular diastolic parameters are consistent with Grade I diastolic dysfunction (impaired relaxation).  4. Global right ventricle has normal systolic function.The right ventricular size is normal. No increase in right ventricular wall thickness.  5. Left atrial size was mildly dilated.  6. Right atrial size was normal.  7. The mitral valve is normal in structure. No evidence of mitral valve regurgitation. No evidence of mitral stenosis.  8. The tricuspid valve is normal in structure. Tricuspid valve regurgitation is mild.  9. The aortic valve is normal in structure. Aortic valve regurgitation is not visualized. No evidence of aortic valve sclerosis or stenosis. 10. The pulmonic valve was normal in structure. Pulmonic valve regurgitation is not visualized. 11. Normal pulmonary artery systolic pressure. 12. The inferior vena cava is normal in size with greater than 50% respiratory variability, suggesting right atrial pressure of 3 mmHg. 13. Mild to moderate pericardial effusion. 14. The pericardial effusion is circumferential. 15. There is intermittent RV collapse and RA inversion. There is more than 26mmHg change in MV inflow velocities. Although these are findings seen with tamponade, the IVC is normal in diameter and fully collapses  with respirations. Clinical correlation recommended. 16. Cannot access prior echo images to compare. FINDINGS  Left Ventricle: Left ventricular ejection fraction, by visual estimation, is 60 to 65%. The left ventricle has normal function. The left ventricle has no regional wall motion abnormalities. Moderately increased left ventricular posterior wall thickness.  There is no left ventricular hypertrophy. Left ventricular diastolic parameters are consistent with Grade I diastolic dysfunction (impaired relaxation). Normal left atrial pressure. Right Ventricle: The right ventricular size is normal. No increase in right ventricular wall thickness. Global RV systolic function is has normal systolic function. The tricuspid regurgitant velocity is 2.31 m/s, and with an assumed right atrial pressure  of 3 mmHg, the estimated right ventricular systolic pressure is normal at 24.3 mmHg. Left Atrium: Left atrial size was mildly dilated. Right Atrium: Right atrial size was normal in size Pericardium: A moderately sized pericardial effusion is present. The pericardial effusion is circumferential. There is inversion of the right atrial wall. There  is no evidence of cardiac tamponade. There is intermittent RV collapse and RA inversion. There is more than 82mmHg change in MV inflow velocities. Although these are findings seen with tamponade, the IVC is normal in diameter and fully collapses with respirations. Clinical correlation recommended. Mitral Valve: The mitral valve is normal in structure. No evidence of mitral valve regurgitation. No evidence of mitral valve stenosis by observation. Tricuspid Valve: The tricuspid valve is normal in structure. Tricuspid valve regurgitation is mild. Aortic Valve: The aortic valve is normal in structure. Aortic valve regurgitation is not visualized. The aortic valve is structurally normal, with no evidence of sclerosis or stenosis. Pulmonic Valve: The pulmonic valve was normal in structure.  Pulmonic valve regurgitation is not visualized. Pulmonic regurgitation is not visualized. Aorta: The aortic root, ascending aorta and aortic arch are all structurally normal, with no evidence of dilitation or obstruction. Venous: The inferior vena cava is normal in size with greater than 50% respiratory variability, suggesting right atrial pressure of 3 mmHg. IAS/Shunts: No atrial level shunt detected by color flow Doppler. There is no evidence of a patent foramen ovale. No ventricular septal defect is seen or detected. There is no evidence of an atrial septal defect.  LEFT VENTRICLE PLAX 2D LVIDd:         4.70 cm  Diastology LVIDs:         2.90 cm  LV e' lateral:   11.20 cm/s LV PW:         1.40 cm  LV E/e' lateral: 9.4 LV IVS:        1.40 cm  LV e' medial:    10.00 cm/s LVOT diam:     1.90 cm  LV E/e' medial:  10.5 LV SV:         70 ml LV SV Index:   38.35 LVOT Area:     2.84 cm  RIGHT VENTRICLE RV S prime:     27.10 cm/s TAPSE (M-mode): 1.7 cm LEFT ATRIUM             Index       RIGHT ATRIUM          Index LA diam:        4.50 cm 2.53 cm/m  RA Area:     8.30 cm LA Vol (A2C):   78.5 ml 44.12 ml/m RA Volume:   17.20 ml 9.67 ml/m LA Vol (A4C):   59.6 ml 33.50 ml/m LA Biplane Vol: 72.4 ml 40.69 ml/m   AORTA Ao Root diam: 3.00 cm MITRAL VALVE                         TRICUSPID VALVE MV Area (PHT): 3.83 cm              TR Peak grad:   21.3 mmHg MV PHT:        57.42 msec            TR Vmax:        231.00 cm/s MV Decel Time: 198 msec MV E velocity: 105.00 cm/s 103 cm/s  SHUNTS MV A velocity: 142.00 cm/s 70.3 cm/s Systemic Diam: 1.90 cm MV E/A ratio:  0.74        1.5  Fransico Him MD Electronically signed by Fransico Him MD Signature Date/Time: 04/13/2019/1:48:26 PM    Final    VAS [place] LOWER EXTREMITY VENOUS (DVT)  Result Date: 04/18/2019  Lower Venous DVTStudy Indications: Swelling.  Comparison Study: No  prior study Performing Technologist: Maudry Mayhew MHA, RDMS, RVT, RDCS  Examination Guidelines: A  complete evaluation includes B-mode imaging, spectral Doppler, color Doppler, and power Doppler as needed of all accessible portions of each vessel. Bilateral testing is considered an integral part of a complete examination. Limited examinations for reoccurring indications may be performed as noted. The reflux portion of the exam is performed with the patient in reverse Trendelenburg.  +---------+---------------+---------+-----------+----------+--------------+ RIGHT    CompressibilityPhasicitySpontaneityPropertiesThrombus Aging +---------+---------------+---------+-----------+----------+--------------+ CFV      Full           Yes      Yes                                 +---------+---------------+---------+-----------+----------+--------------+ SFJ      Full                                                        +---------+---------------+---------+-----------+----------+--------------+ FV Prox  Full                                                        +---------+---------------+---------+-----------+----------+--------------+ FV Mid   Full                                                        +---------+---------------+---------+-----------+----------+--------------+ FV DistalFull                                                        +---------+---------------+---------+-----------+----------+--------------+ PFV      Full                                                        +---------+---------------+---------+-----------+----------+--------------+ POP      Full           Yes      Yes                                 +---------+---------------+---------+-----------+----------+--------------+ PTV      Full                                                        +---------+---------------+---------+-----------+----------+--------------+ PERO     Full                                                         +---------+---------------+---------+-----------+----------+--------------+   +---------+---------------+---------+-----------+----------+--------------+  LEFT     CompressibilityPhasicitySpontaneityPropertiesThrombus Aging +---------+---------------+---------+-----------+----------+--------------+ CFV      Full           Yes      Yes                                 +---------+---------------+---------+-----------+----------+--------------+ SFJ      Full                                                        +---------+---------------+---------+-----------+----------+--------------+ FV Prox  Full                                                        +---------+---------------+---------+-----------+----------+--------------+ FV Mid   Full                                                        +---------+---------------+---------+-----------+----------+--------------+ FV DistalFull                                                        +---------+---------------+---------+-----------+----------+--------------+ PFV      Full                                                        +---------+---------------+---------+-----------+----------+--------------+ POP      Full           Yes      Yes                                 +---------+---------------+---------+-----------+----------+--------------+ PTV      Full                                                        +---------+---------------+---------+-----------+----------+--------------+ PERO     Full                                                        +---------+---------------+---------+-----------+----------+--------------+     Summary: RIGHT: - There is no evidence of deep vein thrombosis in the lower extremity.  - No cystic structure found in the popliteal fossa.  LEFT: - There is no evidence of deep vein thrombosis in the lower extremity.  - No cystic  structure found in the popliteal fossa.   *See table(s) above for measurements and observations. Electronically signed by Servando Snare MD on 04/18/2019 at 6:33:11 PM.    Final     Microbiology: Recent Results (from the past 240 hour(s))  SARS CORONAVIRUS 2 (TAT 6-24 HRS) Nasopharyngeal Nasopharyngeal Swab     Status: None   Collection Time: 04/18/19  2:00 AM   Specimen: Nasopharyngeal Swab  Result Value Ref Range Status   SARS Coronavirus 2 NEGATIVE NEGATIVE Final    Comment: (NOTE) SARS-CoV-2 target nucleic acids are NOT DETECTED. The SARS-CoV-2 RNA is generally detectable in upper and lower respiratory specimens during the acute phase of infection. Negative results do not preclude SARS-CoV-2 infection, do not rule out co-infections with other pathogens, and should not be used as the sole basis for treatment or other patient management decisions. Negative results must be combined with clinical observations, patient history, and epidemiological information. The expected result is Negative. Fact Sheet for Patients: SugarRoll.be Fact Sheet for Healthcare Providers: https://www.woods-mathews.com/ This test is not yet approved or cleared by the [place] FDA and  has been authorized for detection and/or diagnosis of SARS-CoV-2 by FDA under an Emergency Use Authorization (EUA). This EUA will remain  in effect (meaning this test can be used) for the duration of the COVID-19 declaration under Section 56 4(b)(1) of the Act, 21 U.S.C. section 360bbb-3(b)(1), unless the authorization is terminated or revoked sooner. Performed at [facility], [place] [address], [place], [place] [address]   Blood Culture (routine x 2)     Status: None   Collection Time: 04/18/19  4:04 AM   Specimen: BLOOD  Result Value Ref Range Status   Specimen Description BLOOD RIGHT ARM  Final   Special Requests   Final    BOTTLES DRAWN AEROBIC AND ANAEROBIC Blood Culture adequate volume   Culture   Final    NO  GROWTH 5 DAYS Performed at [facility], [place] [address] [place], [place] [address]    Report Status 04/23/2019 FINAL  Final  Blood Culture (routine x 2)     Status: None   Collection Time: 04/18/19  5:40 AM   Specimen: BLOOD  Result Value Ref Range Status   Specimen Description BLOOD SITE NOT SPECIFIED  Final   Special Requests   Final    BOTTLES DRAWN AEROBIC AND ANAEROBIC Blood Culture results may not be optimal due to an inadequate volume of blood received in culture bottles   Culture   Final    NO GROWTH 5 DAYS Performed at [facility], [place] [address] [place], [place] [address]    Report Status 04/23/2019 FINAL  Final  Body fluid culture (includes gram stain)     Status: None (Preliminary result)   Collection Time: 04/24/19  4:51 PM   Specimen: Pleural Fluid  Result Value Ref Range Status   Specimen Description FLUID PLEURAL  Final   Special Requests NONE  Final   Gram Stain   Final    RARE WBC PRESENT, PREDOMINANTLY MONONUCLEAR NO ORGANISMS SEEN    Culture   Final    NO GROWTH < 24 HOURS Performed at [facility], [place] [address], [place], [place] [address]    Report Status PENDING  Incomplete     Labs: Basic Metabolic Panel: Recent Labs  Lab 04/22/19 0356 04/23/19 0354 04/23/19 1413 04/24/19 0512 04/25/19 0236 04/26/19 0538  NA 137 137  --  135 135 135  K 4.3 3.7  --  3.3* 4.2 3.6  CL 97* 96*  --  98 95* 94*  CO2 24 28  --  25 26 24   GLUCOSE 129* 96  --  97 85 88  BUN 49* 24*  --  43* 25* 47*  CREATININE 6.09* 3.88*  --  6.29* 4.82* 8.06*  CALCIUM 8.6* 8.3*  --  8.3* 8.8* 8.8*  PHOS  --   --  4.7*  --  4.4 7.4*   Liver Function Tests: Recent Labs  Lab 04/25/19 0236 04/26/19 0538  ALBUMIN 2.9* 2.8*   No results for input(s): LIPASE, AMYLASE in the last 168 hours. No results for input(s): AMMONIA in the last 168 hours. CBC: Recent Labs  Lab 04/21/19 0218 04/22/19 0356 04/23/19 0354 04/25/19 0236 04/26/19 0538  WBC 10.2  11.5* 11.9* 10.3 7.3  HGB 8.5* 8.8* 8.2* 9.0* 8.3*  HCT 26.2* 27.0* 25.8* 28.5* 25.8*  MCV 70.2* 70.5* 70.9* 71.8* 72.7*  PLT 396 367 254 249 237   Cardiac Enzymes: No results for input(s): CKTOTAL, CKMB, CKMBINDEX, TROPONINI in the last 168 hours. BNP: BNP (last 3 results) Recent Labs    04/12/19 1953 04/20/19 0650  BNP 262.8* 508.3*    ProBNP (last 3 results) No results for input(s): PROBNP in the last 8760 hours.  CBG: No results for input(s): GLUCAP in the last 168 hours.     Signed:  Cristal Ford  [facility] 04/26/2019, 8:01 AM

## 2019-04-24 NOTE — Progress Notes (Signed)
   NAME:  Latasha Drake, MRN:  [id number], DOB:  1981/01/28, LOS: 5 ADMISSION DATE:  04/17/2019, CONSULTATION DATE:  04/18/19 REFERRING MD:  Jeneen Rinks  CHIEF COMPLAINT:  Dyspnea   Brief History   Latasha Drake is a 39 y.o. female who was admitted 2/11 with dyspnea and pleuritic chest pain.    History of present illness   Latasha Drake is a 39 y.o. female who has a PMH including but not limited to HTN, ESRD on home PD, SLE, chronic anemia (see "past medical history" for rest).  She presented to [facility] ED 04/17/19 with dyspnea and pleuritic chest pain both left and right sides and radiating sub sternum into lower back.  Pain described as sharp and worse with inspiration.  She reported dyspnea in ED, SpO2 was 98% at rest; however, dipped to 80's with ambulation.  No recent fevers/chills/sweats, cough, N/V/D, abd pain, myalgias, exposures to known sick contacts. In ED, CXR showed unchanged RLL consolidation with small right effusion.  D-dimer elevated at 13.31, trop 16.  She was admitted by [facility] and was started on empiric heparin.  PCCM was asked to see in AM 2/11 for further recs on effusion.  She had admission to [facility] 04/12/19 through 04/14/19 for RLL PNA.  She had VQ during that admit which was intermediate risk for PE (low end).  Past Medical History  has Acute on chronic blood loss anemia; Lupus ([place]); Hypertension; Chronic kidney disease; Acute renal failure superimposed on chronic kidney disease ([place]); Elevated d-dimer; Pericardial effusion; Traumatic perinephric hematoma of left kidney; Symptomatic anemia; Pneumonia; Chest pain; ESRD (end stage renal disease) ([place]); Acute respiratory failure with hypoxia ([place]); and Pleural effusion on their problem list.  Significant Hospital Events   2/11 > admit.  Consults:  Nephrology IR Cardiology  Procedures:  Tunneled HD cath 2/15  Significant Diagnostic Tests:  CXR 2/10 > unchanged RLL consolidation with small effusion.  Lumbar x-ray 2/14 > No acute osseous  abnormality in the lumbar spine. Degenerative disc disease L5-S1.  Micro Data:  SARS 2/11 > neg. BCX 2/11>   Antimicrobials:  Cefepime 2/11 >   Cefazolin 2/15 x1  Interim history/subjective:  More chest pain and dyspnea today, reconsulted for potential tap.  Objective:  Blood pressure (!) 157/106, pulse 80, temperature 98.4 F (36.9 C), temperature source Oral, resp. rate 16, height 5\' 4"  (1.626 m), weight 64.4 kg, last menstrual period 04/22/2019, SpO2 96 %.        Intake/Output Summary (Last 24 hours) at 04/24/2019 1628 Last data filed at 04/24/2019 1150 Gross per 24 hour  Intake 250 ml  Output 3000 ml  Net -2750 ml   Filed Weights   04/24/19 [id number] 04/24/19 0738 04/24/19 1150  Weight: 68 kg 68.4 kg 64.4 kg   Physical Exam: GEN: 39 year old woman laying in bed in NAD HEENT: MMM, trachea midline PULM: Diminished R base, moderate simple appearing effusion on [place] HEART: ext warm   Assessment & Plan:  R effusion, pleurisy, SLE patient- reasonable to drain to hopefully relieve symptoms.  Will send for usual studies including ANA.  Will write another note once studies back.  Erskine Emery MD PCCM

## 2019-04-24 NOTE — Procedures (Signed)
Patient seen and examined on Hemodialysis. BP (!) 164/105   Pulse 92   Temp 97.9 F (36.6 C) (Oral)   Resp 18   Ht 5\' 4"  (1.626 m)   Wt 68.4 kg   LMP 02/24/2019 Comment: has IUD  SpO2 96%   BMI 25.88 kg/m   QB 400 mL/ min via R TDC, UF goal 3L.  Tolerating treatment without complaints at this time.   Madelon Lips MD 9:42 AM

## 2019-04-24 NOTE — Progress Notes (Signed)
PROGRESS NOTE    Latasha Drake  [id number] DOB: 11/16/1980 DOA: 04/17/2019 PCP: Katherina Mires, MD   Brief Narrative:  HPI On 04/18/2019 by Dr. Arvella Merles a38 y.o.female,38 y.o.female,w hypertension, lupus, anemia,small pericardial effusion,ESRD on peritoneal dialysis,w recent admissionfor right lower lung pneumonia, presents with c/o pleuritic chest pain. Pt had VQ on prior admission intermediate risk but on the lower end. Pt states that the chest pain is right and left sided, "sharp" worse with inspiration. Pt has dyspnea, but pox at rest is 98% , but on walking per RN is 80's. Pt denies fever, chills, cough w productive sputum, palp, n/v, abd pain, diarrhea, brbpr.   Interim history  Patient admitted with dyspnea and pleuritic chest pain.  Chest x-ray noted to have a right lower lobe consolidation, patient started on antibiotics, steroids, IV heparin for possible intermediate probability of PE as previous VQ scan showed that on previous admission.  Pulmonology was also consulted and recommended diuresing.  Nephrology consulted for PD however then transition patient to HD.  Patient continues to have intermittent back pain and pleuritic components of chest pain.  Have consulted PCCM for possible thoracentesis. Assessment & Plan   Acute hypoxemic respiratory failure -Likely secondary to fluid overload in the setting of renal failure uncontrolled hypertension -Initial chest x-ray reviewed and showed right lower lobe infiltrate bilateral pleural effusions -CT 04/12/2019 indicated moderate to severe right lower lobe consolidation bilateral pleural effusions, small pericardial effusion -Patient placed on empiric antibiotics for 5 days -Echocardiogram 04/19/19 showed pericardial effusion smaller than 04/13/2019, negative for tamponade.  EF 60 to 65%.  No regional wall motion abnormalities.  LV diastolic parameters normal -Was initially placed on IV heparin for probable PE or  immediate VQ scan; lower extremity Doppler unremarkable for DVT -PCCM consulted and appreciated, felt respiratory problems felt to be secondary to splinting/alveolar hypoventilation from pleuritic chest pain and felt there is edema on the chest x-ray, recommended diuresis -Hypoventilation likely contributed abnormal V/Q report as well -Patient underwent hemodialysis on 04/19/2019 with 2.9 liters of fluid removal and significant improvement of her shortness of breath.  She was then off of supplemental oxygen -Heparin was then discontinued -Shortness of breath continues to improve with volume removal-patient continues to have some pleuritic pain -Chest x-ray obtained today does show improvement of pleural effusions, although does continue to have right persistent effusion.  -PCCM consult for possible thoracentesis.  Uncontrolled hypertension -Continue hydralazine 100 mg 3 times daily, labetalol 300 mg twice daily, nifedipine 90 mg BID, clonidine 0.1mg  patch weekly -Patient was initially placed on IV steroids which may have been contributing to her uncontrolled hypertension -Nephrology feels her blood pressure will continue to improve with dialysis  ESRD/lupus nephritis -Patient was peritoneal dialysis however has now been transitioned to hemodialysis -Nephrology consulted and appreciated -Patient does have an outpatient dialysis slot in [facility] on Monday Wednesday Friday at 7 AM  SLE, history of lupus nephritis -Patient currently on oral dose of prednisone, 20 mg  Anemia of chronic disease -Patient has adequate iron -Has history of chronic blood loss anemia in the past requiring transfusions -Currently denies any melena or hematochezia or hematemesis -Hemoglobin stable  Hypokalemia -Likely resolve with hemodialysis  Back pain -X-ray showed degenerative disc disease from L5-S1 -Continue pain control  DVT Prophylaxis  Heparin  Code Status: Full  Family  Communication: None at bedside  Disposition Plan: Admitted from home for acute respiratory failure, pneumonia, pleural effusions.  Patient recently started on hemodialysis from  PD.  Pending improvement respiratory status and back pain.  PCCM consulted for possible thoracentesis.  Suspect home the next 1 to 2 days.  Consultants Nephrology PCCM  Procedures  Echocardiogram Lower extremity Doppler  Antibiotics   Anti-infectives (From admission, onward)   Start     Dose/Rate Route Frequency Ordered Stop   04/22/19 0907  ceFAZolin (ANCEF) 2-4 GM/100ML-% IVPB    Note to Pharmacy: [place]   : cabinet override      04/22/19 0907 04/22/19 2114   04/22/19 0900  ceFAZolin (ANCEF) IVPB 2g/100 mL premix     2 g 200 mL/hr over 30 Minutes Intravenous To Radiology 04/22/19 0833 04/23/19 0900   04/18/19 1500  ceFEPIme (MAXIPIME) 1 g in sodium chloride 0.9 % 100 mL IVPB  Status:  Discontinued     1 g 200 mL/hr over 30 Minutes Intravenous Every 24 hours 04/18/19 1352 04/23/19 1113   04/18/19 0430  vancomycin (VANCOREADY) IVPB 1500 mg/300 mL     1,500 mg 150 mL/hr over 120 Minutes Intravenous  Once 04/18/19 0421 04/18/19 0826   04/18/19 0345  vancomycin (VANCOCIN) IVPB 1000 mg/200 mL premix  Status:  Discontinued     1,000 mg 200 mL/hr over 60 Minutes Intravenous  Once 04/18/19 0343 04/18/19 0421      Subjective:   Wilber Oliphant seen and examined today.  Seen in dialysis.  Complaining of back pain which worsens with deep inspiration.  Denies current chest pain.  Does have some shortness of breath with minimal exertion.  Denies abdominal pain, nausea or vomiting, diarrhea or constipation, dizziness or headache.  Objective:   Vitals:   04/24/19 1130 04/24/19 1150 04/24/19 1238 04/24/19 1530  BP: (!) 148/111 (!) 165/114 (!) 151/115 (!) 157/106  Pulse: (!) 104 89 98 80  Resp:  16 16 16   Temp:  98.2 F (36.8 C) 98.1 F (36.7 C) 98.4 F (36.9 C)  TempSrc:  Oral Oral Oral  SpO2:  98% 98%  96%  Weight:  64.4 kg    Height:        Intake/Output Summary (Last 24 hours) at 04/24/2019 1547 Last data filed at 04/24/2019 1150 Gross per 24 hour  Intake 250 ml  Output 3000 ml  Net -2750 ml   Filed Weights   04/24/19 0638 04/24/19 0738 04/24/19 1150  Weight: 68 kg 68.4 kg 64.4 kg    Exam  General: Well developed, well nourished, NAD, appears stated age  20: NCAT, mucous membranes moist.   Cardiovascular: S1 S2 auscultated, RRR, no murmur  Respiratory: Diminished breath sounds  Abdomen: Soft, nontender, nondistended, + bowel sounds  Extremities: warm dry without cyanosis clubbing or edema  Neuro: AAOx3, nonfocal  Psych: Appropriate mood and affect, pleasant   Data Reviewed: I have personally reviewed following labs and imaging studies  CBC: Recent Labs  Lab 04/19/19 0123 04/20/19 0650 04/21/19 0218 04/22/19 0356 04/23/19 0354  WBC 8.5 11.9* 10.2 11.5* 11.9*  HGB 8.3* 8.0* 8.5* 8.8* 8.2*  HCT 25.6* 24.4* 26.2* 27.0* 25.8*  MCV 69.9* 69.9* 70.2* 70.5* 70.9*  PLT 454* 478* 396 367 0000000   Basic Metabolic Panel: Recent Labs  Lab 04/19/19 0123 04/20/19 0650 04/22/19 0356 04/23/19 0354 04/23/19 1413 04/24/19 0512  NA 135 138 137 137  --  135  K 3.7 3.8 4.3 3.7  --  3.3*  CL 97* 102 97* 96*  --  98  CO2 20* 22 24 28   --  25  GLUCOSE 145* 140* 129* 96  --  97  BUN 70* 46* 49* 24*  --  43*  CREATININE 9.23* 6.32* 6.09* 3.88*  --  6.29*  CALCIUM 8.5* 8.3* 8.6* 8.3*  --  8.3*  PHOS  --   --   --   --  4.7*  --    GFR: Estimated Creatinine Clearance: 10.3 mL/min (A) (by C-G formula based on SCr of 6.29 mg/dL (H)). Liver Function Tests: Recent Labs  Lab 04/19/19 0123  AST 14*  ALT 11  ALKPHOS 81  BILITOT 0.4  PROT 6.8  ALBUMIN 2.9*   No results for input(s): LIPASE, AMYLASE in the last 168 hours. No results for input(s): AMMONIA in the last 168 hours. Coagulation Profile: Recent Labs  Lab 04/18/19 0010  INR 1.0   Cardiac Enzymes: No  results for input(s): CKTOTAL, CKMB, CKMBINDEX, TROPONINI in the last 168 hours. BNP (last 3 results) No results for input(s): PROBNP in the last 8760 hours. HbA1C: No results for input(s): HGBA1C in the last 72 hours. CBG: No results for input(s): GLUCAP in the last 168 hours. Lipid Profile: No results for input(s): CHOL, HDL, LDLCALC, TRIG, CHOLHDL, LDLDIRECT in the last 72 hours. Thyroid Function Tests: No results for input(s): TSH, T4TOTAL, FREET4, T3FREE, THYROIDAB in the last 72 hours. Anemia Panel: No results for input(s): VITAMINB12, FOLATE, FERRITIN, TIBC, IRON, RETICCTPCT in the last 72 hours. Urine analysis:    Component Value Date/Time   COLORURINE STRAW (A) 04/20/2019 2333   APPEARANCEUR CLEAR 04/20/2019 2333   LABSPEC 1.010 04/20/2019 2333   PHURINE 7.0 04/20/2019 2333   GLUCOSEU 50 (A) 04/20/2019 2333   HGBUR SMALL (A) 04/20/2019 2333   BILIRUBINUR NEGATIVE 04/20/2019 2333   [place] 04/20/2019 2333   PROTEINUR 100 (A) 04/20/2019 2333   NITRITE NEGATIVE 04/20/2019 2333   LEUKOCYTESUR NEGATIVE 04/20/2019 2333   Sepsis Labs: @LABRCNTIP (procalcitonin:4,lacticidven:4)  ) Recent Results (from the past 240 hour(s))  SARS CORONAVIRUS 2 (TAT 6-24 HRS) Nasopharyngeal Nasopharyngeal Swab     Status: None   Collection Time: 04/18/19  2:00 AM   Specimen: Nasopharyngeal Swab  Result Value Ref Range Status   SARS Coronavirus 2 NEGATIVE NEGATIVE Final    Comment: (NOTE) SARS-CoV-2 target nucleic acids are NOT DETECTED. The SARS-CoV-2 RNA is generally detectable in upper and lower respiratory specimens during the acute phase of infection. Negative results do not preclude SARS-CoV-2 infection, do not rule out co-infections with other pathogens, and should not be used as the sole basis for treatment or other patient management decisions. Negative results must be combined with clinical observations, patient history, and epidemiological information. The  expected result is Negative. Fact Sheet for Patients: SugarRoll.be Fact Sheet for Healthcare Providers: https://www.woods-mathews.com/ This test is not yet approved or cleared by the [place] FDA and  has been authorized for detection and/or diagnosis of SARS-CoV-2 by FDA under an Emergency Use Authorization (EUA). This EUA will remain  in effect (meaning this test can be used) for the duration of the COVID-19 declaration under Section 56 4(b)(1) of the Act, 21 U.S.C. section 360bbb-3(b)(1), unless the authorization is terminated or revoked sooner. Performed at [facility], [place] [address], [place], [place] [address]   Blood Culture (routine x 2)     Status: None   Collection Time: 04/18/19  4:04 AM   Specimen: BLOOD  Result Value Ref Range Status   Specimen Description BLOOD RIGHT ARM  Final   Special Requests   Final    BOTTLES DRAWN AEROBIC AND ANAEROBIC Blood  Culture adequate volume   Culture   Final    NO GROWTH 5 DAYS Performed at [facility], [place] [address] [place], [place] [address]    Report Status 04/23/2019 FINAL  Final  Blood Culture (routine x 2)     Status: None   Collection Time: 04/18/19  5:40 AM   Specimen: BLOOD  Result Value Ref Range Status   Specimen Description BLOOD SITE NOT SPECIFIED  Final   Special Requests   Final    BOTTLES DRAWN AEROBIC AND ANAEROBIC Blood Culture results may not be optimal due to an inadequate volume of blood received in culture bottles   Culture   Final    NO GROWTH 5 DAYS Performed at [facility], [place] [address], [place], [place] [address]    Report Status 04/23/2019 FINAL  Final      Radiology Studies: DG Chest 2 View  Result Date: 04/24/2019 CLINICAL DATA:  Shortness of breath EXAM: CHEST - 2 VIEW COMPARISON:  04/18/2019 FINDINGS: No significant change in moderate right, small left pleural effusions with associated atelectasis or consolidation. Interval  placement of right chest large bore multi lumen vascular catheter. Cardiomegaly. IMPRESSION: 1. No significant change in moderate right, small left pleural effusions with associated atelectasis or consolidation. 2. Interval placement of right chest large bore multi lumen vascular catheter. 3.  Cardiomegaly. Electronically Signed   By: Eddie Candle M.D.   On: 04/24/2019 14:43     Scheduled Meds: . Chlorhexidine Gluconate Cloth  6 each Topical Daily  . cloNIDine  0.1 mg Transdermal Weekly  . darbepoetin (ARANESP) injection - DIALYSIS  40 mcg Intravenous Q Sat-HD  . heparin injection (subcutaneous)  5,000 Units Subcutaneous Q8H  . hydrALAZINE  100 mg Oral TID  . labetalol  300 mg Oral BID  . NIFEdipine  90 mg Oral BID  . predniSONE  20 mg Oral Q breakfast  . sodium chloride flush  3 mL Intravenous Once   Continuous Infusions:   LOS: 5 days   Time Spent in minutes   45 minutes  Lashayla Armes D.O. on 04/24/2019 at 3:47 PM  Between 7am to 7pm - Please see pager noted on [email or web address]  After 7pm go to [email or web address]  And look for the night coverage person covering for me after hours  [facility] Office  [phone]

## 2019-04-24 NOTE — Progress Notes (Signed)
[facility] Progress Note   Subjective:  Seen on HD.  Tolerating rx well, notes some R low back pain which is bothersome.    Objective Vitals:   04/24/19 0800 04/24/19 0830 04/24/19 0900 04/24/19 0930  BP: (!) 154/103 (!) 164/101 (!) 163/104 (!) 164/105  Pulse: 81 85 83 92  Resp:      Temp:      TempSrc:      SpO2:      Weight:      Height:       Physical Exam General: Well appearing young woman, NAD Heart: RRR; no murmur or tub Lungs: CTA anteriorly Abdomen: soft, non-tender, PD cath Extremities: No LE edema Dialysis Access: TDC in R chest - nontender today  Additional Objective Labs: Basic Metabolic Panel: Recent Labs  Lab 04/22/19 0356 04/23/19 0354 04/23/19 1413 04/24/19 0512  NA 137 137  --  135  K 4.3 3.7  --  3.3*  CL 97* 96*  --  98  CO2 24 28  --  25  GLUCOSE 129* 96  --  97  BUN 49* 24*  --  43*  CREATININE 6.09* 3.88*  --  6.29*  CALCIUM 8.6* 8.3*  --  8.3*  PHOS  --   --  4.7*  --    Liver Function Tests: Recent Labs  Lab 04/19/19 0123  AST 14*  ALT 11  ALKPHOS 81  BILITOT 0.4  PROT 6.8  ALBUMIN 2.9*   CBC: Recent Labs  Lab 04/19/19 0123 04/19/19 0123 04/20/19 0650 04/20/19 0650 04/21/19 0218 04/22/19 0356 04/23/19 0354  WBC 8.5   < > 11.9*   < > 10.2 11.5* 11.9*  HGB 8.3*   < > 8.0*   < > 8.5* 8.8* 8.2*  HCT 25.6*   < > 24.4*   < > 26.2* 27.0* 25.8*  MCV 69.9*  --  69.9*  --  70.2* 70.5* 70.9*  PLT 454*   < > 478*   < > 396 367 254   < > = values in this interval not displayed.   Studies/Results: IR Fluoro Guide CV Line Right  Result Date: 04/22/2019 INDICATION: 39 year old with end-stage renal disease. Patient's transitioning from peritoneal to hemodialysis. Patient needs a tunneled dialysis catheter. The non tunneled right jugular dialysis catheter was removed prior to this procedure. EXAM: FLUOROSCOPIC AND ULTRASOUND GUIDED PLACEMENT OF A TUNNELED DIALYSIS CATHETER Physician: Stephan Minister. Anselm Pancoast, MD MEDICATIONS:  Ancef 2 g; The antibiotic was administered within an appropriate time interval prior to skin puncture. ANESTHESIA/SEDATION: Versed 2.0 mg IV; Fentanyl 100 mcg IV; Moderate Sedation Time:  22 minutes The patient was continuously monitored during the procedure by the interventional radiology nurse under my direct supervision. FLUOROSCOPY TIME:  Fluoroscopy Time: 12 seconds, 1 mGy COMPLICATIONS: None immediate. PROCEDURE: The procedure was explained to the patient. The risks and benefits of the procedure were discussed and the patient's questions were addressed. Informed consent was obtained from the patient. The patient was placed supine on the interventional table. Ultrasound confirmed a patent right internal jugular vein. Ultrasound images were obtained for documentation. The right neck and chest was prepped and draped in a sterile fashion. The right neck was anesthetized with 1% lidocaine. Maximal barrier sterile technique was utilized including caps, mask, sterile gowns, sterile gloves, sterile drape, hand hygiene and skin antiseptic. A small incision was made with #11 blade scalpel. A 21 gauge needle directed into the right internal jugular vein with ultrasound guidance. A micropuncture dilator  set was placed. A 19 cm tip to cuff Palindrome catheter was selected. The skin below the right clavicle was anesthetized and a small incision was made with an #11 blade scalpel. A subcutaneous tunnel was formed to the vein dermatotomy site. The catheter was brought through the tunnel. The vein dermatotomy site was dilated to accommodate a peel-away sheath. The catheter was placed through the peel-away sheath and directed into the central venous structures. The tip of the catheter was placed at the SVC and right atrium junction with fluoroscopy. Fluoroscopic images were obtained for documentation. Both lumens were found to aspirate and flush well. The proper amount of heparin was flushed in both lumens. The vein dermatotomy  site was closed using a single layer of absorbable suture and Dermabond. Gel-Foam placed in subcutaneous tract. The catheter was secured to the skin using Prolene suture. IMPRESSION: Successful placement of a right jugular tunneled dialysis catheter using ultrasound and fluoroscopic guidance. Electronically Signed   By: Markus Daft M.D.   On: 04/22/2019 10:44   IR US Guide Vasc Access Right  Result Date: 04/22/2019 INDICATION: 39 year old with end-stage renal disease. Patient's transitioning from peritoneal to hemodialysis. Patient needs a tunneled dialysis catheter. The non tunneled right jugular dialysis catheter was removed prior to this procedure. EXAM: FLUOROSCOPIC AND ULTRASOUND GUIDED PLACEMENT OF A TUNNELED DIALYSIS CATHETER Physician: Stephan Minister. Anselm Pancoast, MD MEDICATIONS: Ancef 2 g; The antibiotic was administered within an appropriate time interval prior to skin puncture. ANESTHESIA/SEDATION: Versed 2.0 mg IV; Fentanyl 100 mcg IV; Moderate Sedation Time:  22 minutes The patient was continuously monitored during the procedure by the interventional radiology nurse under my direct supervision. FLUOROSCOPY TIME:  Fluoroscopy Time: 12 seconds, 1 mGy COMPLICATIONS: None immediate. PROCEDURE: The procedure was explained to the patient. The risks and benefits of the procedure were discussed and the patient's questions were addressed. Informed consent was obtained from the patient. The patient was placed supine on the interventional table. Ultrasound confirmed a patent right internal jugular vein. Ultrasound images were obtained for documentation. The right neck and chest was prepped and draped in a sterile fashion. The right neck was anesthetized with 1% lidocaine. Maximal barrier sterile technique was utilized including caps, mask, sterile gowns, sterile gloves, sterile drape, hand hygiene and skin antiseptic. A small incision was made with #11 blade scalpel. A 21 gauge needle directed into the right internal jugular  vein with ultrasound guidance. A micropuncture dilator set was placed. A 19 cm tip to cuff Palindrome catheter was selected. The skin below the right clavicle was anesthetized and a small incision was made with an #11 blade scalpel. A subcutaneous tunnel was formed to the vein dermatotomy site. The catheter was brought through the tunnel. The vein dermatotomy site was dilated to accommodate a peel-away sheath. The catheter was placed through the peel-away sheath and directed into the central venous structures. The tip of the catheter was placed at the SVC and right atrium junction with fluoroscopy. Fluoroscopic images were obtained for documentation. Both lumens were found to aspirate and flush well. The proper amount of heparin was flushed in both lumens. The vein dermatotomy site was closed using a single layer of absorbable suture and Dermabond. Gel-Foam placed in subcutaneous tract. The catheter was secured to the skin using Prolene suture. IMPRESSION: Successful placement of a right jugular tunneled dialysis catheter using ultrasound and fluoroscopic guidance. Electronically Signed   By: Markus Daft M.D.   On: 04/22/2019 10:44   Medications:  . Chlorhexidine Gluconate Cloth  6 each Topical Daily  . cloNIDine  0.1 mg Transdermal Weekly  . darbepoetin (ARANESP) injection - DIALYSIS  40 mcg Intravenous Q Sat-HD  . heparin injection (subcutaneous)  5,000 Units Subcutaneous Q8H  . hydrALAZINE  100 mg Oral TID  . labetalol  300 mg Oral BID  . NIFEdipine  90 mg Oral BID  . predniSONE  20 mg Oral Q breakfast  . sodium chloride flush  3 mL Intravenous Once    Dialysis Orders: CAPD -> establishing new hemodialysis orders, dry weight.  Assessment/Plan: 1. Dyspnea/Pleuritic CP: Recent abx for pneumonia - repeat CXR on admission with unchanged RLL consolidation and R effusion. Intermediate prob for PE per nuclear scan. Will repeat CXR to assess if consolidation/ effusions are improved. 2. ESRD:  Transitioned from PD to HD. S/p [facility] 2/15. Transitioning to MWF sched - HD today 3. HTN/volume:  BP high, titrating up meds  - continue to challenge down weight as tolerated. Suspect she has a lot of volume to give 4. Anemia: Hgb 8.2 - on Aranesp q Sat. Tsat 36%. 5. Secondary hyperparathyroidism:  Ca ok, will check Phos tomorrow. Not on binders/VDRA for now. 6. SLE: On prednisone 20 mg QD 7.  R low back pain: hgb stable, lower suspicion for RP bleed, no belly pain so doubt peritonitis.  Lumbar xray 2/14 without acute abnormalities.  Leukocytosis up slightly from 2 days ago, monitor.  If remains a concern could consider cross-sectional imaging to rule out referred pain from appy, would think that that would have declared itself by now.   Madelon Lips MD 04/24/2019, 9:35 AM  [facility] Pager: [phone]   ``

## 2019-04-25 ENCOUNTER — Other Ambulatory Visit: Payer: Self-pay

## 2019-04-25 DIAGNOSIS — R091 Pleurisy: Secondary | ICD-10-CM

## 2019-04-25 DIAGNOSIS — M329 Systemic lupus erythematosus, unspecified: Secondary | ICD-10-CM

## 2019-04-25 DIAGNOSIS — N186 End stage renal disease: Secondary | ICD-10-CM

## 2019-04-25 LAB — BODY FLUID CELL COUNT WITH DIFFERENTIAL
Eos, Fluid: 0 %
Lymphs, Fluid: 17 %
Monocyte-Macrophage-Serous Fluid: 25 % — ABNORMAL LOW (ref 50–90)
Neutrophil Count, Fluid: 58 % — ABNORMAL HIGH (ref 0–25)
Total Nucleated Cell Count, Fluid: 443 cu mm (ref 0–1000)

## 2019-04-25 LAB — LACTATE DEHYDROGENASE: LDH: 180 U/L (ref 98–192)

## 2019-04-25 LAB — GLUCOSE, PLEURAL OR PERITONEAL FLUID: Glucose, Fluid: 111 mg/dL

## 2019-04-25 LAB — RENAL FUNCTION PANEL
Albumin: 2.9 g/dL — ABNORMAL LOW (ref 3.5–5.0)
Anion gap: 14 (ref 5–15)
BUN: 25 mg/dL — ABNORMAL HIGH (ref 6–20)
CO2: 26 mmol/L (ref 22–32)
Calcium: 8.8 mg/dL — ABNORMAL LOW (ref 8.9–10.3)
Chloride: 95 mmol/L — ABNORMAL LOW (ref 98–111)
Creatinine, Ser: 4.82 mg/dL — ABNORMAL HIGH (ref 0.44–1.00)
GFR calc Af Amer: 12 mL/min — ABNORMAL LOW (ref >60)
GFR calc non Af Amer: 11 mL/min — ABNORMAL LOW (ref >60)
Glucose, Bld: 85 mg/dL (ref 70–99)
Phosphorus: 4.4 mg/dL (ref 2.5–4.6)
Potassium: 4.2 mmol/L (ref 3.5–5.1)
Sodium: 135 mmol/L (ref 135–145)

## 2019-04-25 LAB — CBC
HCT: 28.5 % — ABNORMAL LOW (ref 36.0–46.0)
Hemoglobin: 9 g/dL — ABNORMAL LOW (ref 12.0–15.0)
MCH: 22.7 pg — ABNORMAL LOW (ref 26.0–34.0)
MCHC: 31.6 g/dL (ref 30.0–36.0)
MCV: 71.8 fL — ABNORMAL LOW (ref 80.0–100.0)
Platelets: 249 10*3/uL (ref 150–400)
RBC: 3.97 MIL/uL (ref 3.87–5.11)
RDW: 18.5 % — ABNORMAL HIGH (ref 11.5–15.5)
WBC: 10.3 10*3/uL (ref 4.0–10.5)
nRBC: 0 % (ref 0.0–0.2)

## 2019-04-25 LAB — ANA: Anti Nuclear Antibody (ANA): POSITIVE — AB

## 2019-04-25 MED ORDER — DICLOFENAC SODIUM 1 % EX GEL
4.0000 g | Freq: Four times a day (QID) | CUTANEOUS | Status: DC
  Administered 2019-04-25 (×4): 4 g via TOPICAL
  Filled 2019-04-25: qty 100

## 2019-04-25 NOTE — Progress Notes (Signed)
[facility] Progress Note   Subjective:  Seen in room - sore in R chest s/p thora last night with 685mL fluid removed - grain stain neg, Cx pending, fluid ^LDH - follow rest of results. No CP or dyspnea. Dialyzed yesterday with 3L UF - tolerated without issues.  Objective Vitals:   04/24/19 1659 04/24/19 2100 04/24/19 2345 04/25/19 0626  BP: (!) 146/101 (!) 138/100  121/89  Pulse: 81 83  90  Resp: 16   18  Temp: 98.5 F (36.9 C)  98.3 F (36.8 C) 98.2 F (36.8 C)  TempSrc: Oral  Oral Oral  SpO2: 97% 94%  95%  Weight:    64 kg  Height:       Physical Exam General: Well appearing young woman, NAD Heart: RRR; no murmur or tub Lungs: CTA anteriorly Abdomen: soft, non-tender, PD cath Extremities: No LE edema Dialysis Access: TDC in R chest, non-tender  Additional Objective Labs: Basic Metabolic Panel: Recent Labs  Lab 04/23/19 0354 04/23/19 1413 04/24/19 0512 04/25/19 0236  NA 137  --  135 135  K 3.7  --  3.3* 4.2  CL 96*  --  98 95*  CO2 28  --  25 26  GLUCOSE 96  --  97 85  BUN 24*  --  43* 25*  CREATININE 3.88*  --  6.29* 4.82*  CALCIUM 8.3*  --  8.3* 8.8*  PHOS  --  4.7*  --  4.4   Liver Function Tests: Recent Labs  Lab 04/19/19 0123 04/25/19 0236  AST 14*  --   ALT 11  --   ALKPHOS 81  --   BILITOT 0.4  --   PROT 6.8  --   ALBUMIN 2.9* 2.9*   CBC: Recent Labs  Lab 04/20/19 0650 04/20/19 0650 04/21/19 0218 04/21/19 0218 04/22/19 0356 04/23/19 0354 04/25/19 0236  WBC 11.9*   < > 10.2   < > 11.5* 11.9* 10.3  HGB 8.0*   < > 8.5*   < > 8.8* 8.2* 9.0*  HCT 24.4*   < > 26.2*   < > 27.0* 25.8* 28.5*  MCV 69.9*  --  70.2*  --  70.5* 70.9* 71.8*  PLT 478*   < > 396   < > 367 254 249   < > = values in this interval not displayed.   Blood Culture    Component Value Date/Time   SDES FLUID PLEURAL 04/24/2019 1651   SPECREQUEST NONE 04/24/2019 1651   CULT  04/24/2019 1651    NO GROWTH < 24 HOURS Performed at [facility],  [place] [address] [place], [place] [address]    REPTSTATUS PENDING 04/24/2019 1651   Studies/Results: DG Chest 2 View  Result Date: 04/24/2019 CLINICAL DATA:  Shortness of breath EXAM: CHEST - 2 VIEW COMPARISON:  04/18/2019 FINDINGS: No significant change in moderate right, small left pleural effusions with associated atelectasis or consolidation. Interval placement of right chest large bore multi lumen vascular catheter. Cardiomegaly. IMPRESSION: 1. No significant change in moderate right, small left pleural effusions with associated atelectasis or consolidation. 2. Interval placement of right chest large bore multi lumen vascular catheter. 3.  Cardiomegaly. Electronically Signed   By: Eddie Candle M.D.   On: 04/24/2019 14:43   DG CHEST PORT 1 VIEW  Result Date: 04/24/2019 CLINICAL DATA:  Post RIGHT thoracentesis. EXAM: PORTABLE CHEST 1 VIEW 5:28 p.m.: COMPARISON:  Portable chest x-ray earlier same day at 2:02 p.m. and previously. FINDINGS: No  evidence of pneumothorax after RIGHT thoracentesis. Resolution of the RIGHT pleural effusion. Improved aeration at the RIGHT lung base with only minimal atelectasis persisting. Stable small LEFT pleural effusion and mild LEFT basilar atelectasis. No new pulmonary parenchymal abnormalities. RIGHT jugular dialysis catheter tips project at or near the cavoatrial junction, unchanged IMPRESSION: 1. No pneumothorax after RIGHT thoracentesis. 2. Resolution of the RIGHT pleural effusion. 3. Improved aeration at the RIGHT lung base with only minimal atelectasis persisting. 4. Stable small LEFT pleural effusion and mild LEFT basilar atelectasis. Electronically Signed   By: Evangeline Dakin M.D.   On: 04/24/2019 19:34   Medications:  . Chlorhexidine Gluconate Cloth  6 each Topical Daily  . cloNIDine  0.1 mg Transdermal Weekly  . darbepoetin (ARANESP) injection - DIALYSIS  40 mcg Intravenous Q Sat-HD  . diclofenac Sodium  4 g Topical QID  . heparin injection (subcutaneous)   5,000 Units Subcutaneous Q8H  . hydrALAZINE  100 mg Oral TID  . labetalol  300 mg Oral BID  . NIFEdipine  90 mg Oral BID  . predniSONE  20 mg Oral Q breakfast  . sodium chloride flush  3 mL Intravenous Once    Dialysis Orders: Has outpatient HD slot - MWF 7am at [facility] CAPD -> establishing new hemodialysis orders, dry weight.  Assessment/Plan: 1. Dyspnea/Pleuritic CP: Recent abx for pneumonia - repeat CXR on admission with unchanged RLL consolidation and R effusion. Intermediate prob for PE per nuclear scan. Repeat CXR with persistent effusion - s/p R thora 2/16 - gram stain with WBC only, ^ LDH, Cx pending. 2. ESRD: Transitioned from PD to HD on MWF sched. S/p [facility] 2/15. Next HD 2/19.  3. HTN/volume:  BP much improved with challenging down weight and med tittration. 4. Anemia: Hgb 9 - on Aranesp q Sat. Tsat 36%. 5. Secondary hyperparathyroidism: Ca/Phos good. Not on binders/VDRA for now. 6. SLE: On prednisone 20 mg QD 7.  R low back pain: Hgb stable, lower suspicion for RP bleed, no belly pain so doubt peritonitis.  Lumbar xray 2/14 without acute abnormalities.  Seems to be improving. Follow.  Veneta Penton, PA-C 04/25/2019, 8:49 AM  [facility] Pager: [phone]

## 2019-04-25 NOTE — Progress Notes (Signed)
NAME:  Genever Fissel, MRN:  [id number], DOB:  Jul 13, 1980, LOS: 6 ADMISSION DATE:  04/17/2019, CONSULTATION DATE:  04/18/19 REFERRING MD:  Jeneen Rinks  CHIEF COMPLAINT:  Dyspnea   Brief History   Yomaris Serbus is a 39 y.o. female who was admitted 2/11 with dyspnea and pleuritic chest pain.    History of present illness   Lurlean Battistelli is a 39 y.o. female who has a PMH including but not limited to HTN, ESRD on home PD, SLE, chronic anemia (see "past medical history" for rest).  She presented to [facility] ED 04/17/19 with dyspnea and pleuritic chest pain both left and right sides and radiating sub sternum into lower back.  Pain described as sharp and worse with inspiration.  She reported dyspnea in ED, SpO2 was 98% at rest; however, dipped to 80's with ambulation.  No recent fevers/chills/sweats, cough, N/V/D, abd pain, myalgias, exposures to known sick contacts. In ED, CXR showed unchanged RLL consolidation with small right effusion.  D-dimer elevated at 13.31, trop 16.  She was admitted by [facility] and was started on empiric heparin.  PCCM was asked to see in AM 2/11 for further recs on effusion.  She had admission to [facility] 04/12/19 through 04/14/19 for RLL PNA.  She had VQ during that admit which was intermediate risk for PE (low end).  Past Medical History  has Acute on chronic blood loss anemia; Lupus ([place]); Hypertension; Chronic kidney disease; Acute renal failure superimposed on chronic kidney disease ([place]); Elevated d-dimer; Pericardial effusion; Traumatic perinephric hematoma of left kidney; Symptomatic anemia; Pneumonia; Chest pain; ESRD (end stage renal disease) ([place]); Acute respiratory failure with hypoxia ([place]); and Pleural effusion on their problem list.  Significant Hospital Events   2/11 > admit.  Consults:  Nephrology IR Cardiology  Procedures:  Tunneled HD cath 2/15  Significant Diagnostic Tests:  CXR 2/10 > unchanged RLL consolidation with small effusion.  Lumbar x-ray 2/14 > No acute osseous  abnormality in the lumbar spine. Degenerative disc disease L5-S1.  Micro Data:  SARS 2/11 > neg. BCX 2/11>   Antimicrobials:  Cefepime 2/11 >   Cefazolin 2/15 x1  Interim history/subjective:  This morning Ms. Fraleigh is having pain after her thoracentesis.  She notes that there was pain towards the end of her procedure as well.  It is improved with Voltaren gel and heating pads.  Objective:  Blood pressure 104/71, pulse 87, temperature 98.6 F (37 C), temperature source Oral, resp. rate 18, height 5\' 4"  (1.626 m), weight 64 kg, last menstrual period 04/22/2019, SpO2 93 %.        Intake/Output Summary (Last 24 hours) at 04/25/2019 1605 Last data filed at 04/25/2019 0330 Gross per 24 hour  Intake 240 ml  Output --  Net 240 ml   Filed Weights   04/24/19 0738 04/24/19 1150 04/25/19 0626  Weight: 68.4 kg 64.4 kg 64 kg   Physical Exam: GEN: 39 year old woman laying in bed in NAD HEENT: MMM, trachea midline PULM: Diminished R base HEART: ext warm NEURO: normal speech, no focal asymmetry LINES: right chest HD catheter  Labs: Pleural fluid studies show elevated pleural to serum LDH ratio, glucose and body fluid is 111, cell count shows 443 nucleated cells and a neutrophil predominance.  Chest x-ray shows improved right-sided pleural effusion  Assessment & Plan:  Acute hypoxemic respiratory failure Bilateral pleural effusions right greater than left SLE Pleurisy Stage renal disease secondary to SLE  She initially presented with shortness of breath and respiratory  failure.  She is now weaned down to room air.  Oral fluid studies demonstrate mild transudate based on light's criteria for LDH.  Not consistent with infection or empyema.  If her effusions were related to her lupus, I would have expected a much lower body fluid glucose, as well as much higher pleural LDH.  Regardless the treatment for pleural effusions and pleurisy related to lupus is NSAIDs and prednisone which she is  already on.  I suspect that most likely this is related to suboptimal volume clearance with peritoneal dialysis.  Hopefully this improves with HD.  From a pulmonary standpoint she is okay for discharge.  I agree with pain control with topical NSAIDs and heating pads.  If the stomach NSAIDs are permissible from a nephrology standpoint that may also be helpful.  No objection to discharge from a pulmonary standpoint.  Lenice Llamas, MD Pulmonary and [place] Pager: [place]

## 2019-04-25 NOTE — Progress Notes (Addendum)
PROGRESS NOTE    Scarlette Gammill  [id number] DOB: 30-Nov-1980 DOA: 04/17/2019 PCP: Katherina Mires, MD   Brief Narrative:  HPI On 04/18/2019 by Dr. Arvella Merles 39 y.o.female,39 y.o.female,w hypertension, lupus, anemia,small pericardial effusion,ESRD on peritoneal dialysis,w recent admissionfor right lower lung pneumonia, presents with c/o pleuritic chest pain. Pt had VQ on prior admission intermediate risk but on the lower end. Pt states that the chest pain is right and left sided, "sharp" worse with inspiration. Pt has dyspnea, but pox at rest is 98% , but on walking per RN is 80's. Pt denies fever, chills, cough w productive sputum, palp, n/v, abd pain, diarrhea, brbpr.   Interim history  Patient admitted with dyspnea and pleuritic chest pain.  Chest x-ray noted to have a right lower lobe consolidation, patient started on antibiotics, steroids, IV heparin for possible intermediate probability of PE as previous VQ scan showed that on previous admission.  Pulmonology was also consulted and recommended diuresing.  Nephrology consulted for PD however then transition patient to HD.  Patient continues to have intermittent back pain and pleuritic components of chest pain.  Have consulted PCCM for possible thoracentesis. Assessment & Plan   Acute hypoxemic respiratory failure -Likely secondary to fluid overload in the setting of renal failure uncontrolled hypertension -Initial chest x-ray reviewed and showed right lower lobe infiltrate bilateral pleural effusions -CT 04/12/2019 indicated moderate to severe right lower lobe consolidation bilateral pleural effusions, small pericardial effusion -Patient placed on empiric antibiotics for 5 days -Echocardiogram 04/19/19 showed pericardial effusion smaller than 04/13/2019, negative for tamponade.  EF 60 to 65%.  No regional wall motion abnormalities.  LV diastolic parameters normal -Was initially placed on IV heparin for probable PE or  immediate VQ scan; lower extremity Doppler unremarkable for DVT -PCCM consulted and appreciated, felt respiratory problems felt to be secondary to splinting/alveolar hypoventilation from pleuritic chest pain and felt there is edema on the chest x-ray, recommended diuresis -Hypoventilation likely contributed abnormal V/Q report as well -Patient underwent hemodialysis on 04/19/2019 with 2.9 liters of fluid removal and significant improvement of her shortness of breath.  She was then off of supplemental oxygen -Heparin was then discontinued -Shortness of breath continues to improve with volume removal-patient continues to have some pleuritic pain -Chest x-ray obtained today does show improvement of pleural effusions, although does continue to have right persistent effusion.  -PCCM consulted, s/p R US guided thoracentesis, yielding 600cc pink tinged fluid- pending labs  Uncontrolled hypertension -Continue hydralazine 100 mg 3 times daily, labetalol 300 mg twice daily, nifedipine 90 mg BID, clonidine 0.1mg  patch weekly -Patient was initially placed on IV steroids which may have been contributing to her uncontrolled hypertension -Nephrology feels her blood pressure will continue to improve with dialysis -BP well controlled this morning, 121/89  ESRD/lupus nephritis -Patient was peritoneal dialysis however has now been transitioned to hemodialysis -Nephrology consulted and appreciated -Patient does have an outpatient dialysis slot in [facility] Monday Wednesday Friday at 7 AM  SLE, history of lupus nephritis -Patient currently on oral dose of prednisone, 20 mg  Anemia of chronic disease -Patient has adequate iron -Has history of chronic blood loss anemia in the past requiring transfusions -Currently denies any melena or hematochezia or hematemesis -Hemoglobin stable  Hypokalemia -Resolved with hemodialysis  Back pain -X-ray showed degenerative disc disease from  L5-S1 -Continue pain control -possible referred from pneumonia- complains it is on the right side and after thoracentesis feels sharp pain -will order Kpad, voltaren  gel (discussed with nephro) -Continue IV pain control as well as oral  DVT Prophylaxis  Heparin  Code Status: Full  Family Communication: None at bedside  Disposition Plan: Admitted from home for acute respiratory failure, pneumonia, pleural effusions.  Patient recently started on hemodialysis from PD.  Pending improvement respiratory status and back pain.  Pending thoracentesis results and improvement in pain.  Suspect home the next 1 to 2 days.  Consultants Nephrology PCCM  Procedures  Echocardiogram Lower extremity Doppler  Antibiotics   Anti-infectives (From admission, onward)   Start     Dose/Rate Route Frequency Ordered Stop   04/22/19 0907  ceFAZolin (ANCEF) 2-4 GM/100ML-% IVPB    Note to Pharmacy: [place]   : cabinet override      04/22/19 0907 04/22/19 2114   04/22/19 0900  ceFAZolin (ANCEF) IVPB 2g/100 mL premix     2 g 200 mL/hr over 30 Minutes Intravenous To Radiology 04/22/19 0833 04/23/19 0900   04/18/19 1500  ceFEPIme (MAXIPIME) 1 g in sodium chloride 0.9 % 100 mL IVPB  Status:  Discontinued     1 g 200 mL/hr over 30 Minutes Intravenous Every 24 hours 04/18/19 1352 04/23/19 1113   04/18/19 0430  vancomycin (VANCOREADY) IVPB 1500 mg/300 mL     1,500 mg 150 mL/hr over 120 Minutes Intravenous  Once 04/18/19 0421 04/18/19 0826   04/18/19 0345  vancomycin (VANCOCIN) IVPB 1000 mg/200 mL premix  Status:  Discontinued     1,000 mg 200 mL/hr over 60 Minutes Intravenous  Once 04/18/19 0343 04/18/19 0421      Subjective:   Wilber Oliphant seen and examined today.  Continues to complain of back pain, especially on the right side.  States it was dull prior to the thoracentesis however now sharp.  States she is unable to take a deep breath due to pain.  Feels that all of her pain medications wear off too  quickly.  Denies chest pain, abdominal pain, nausea or vomiting, diarrhea constipation, dizziness or headache.  Objective:   Vitals:   04/24/19 1659 04/24/19 2100 04/24/19 2345 04/25/19 0626  BP: (!) 146/101 (!) 138/100  121/89  Pulse: 81 83  90  Resp: 16   18  Temp: 98.5 F (36.9 C)  98.3 F (36.8 C) 98.2 F (36.8 C)  TempSrc: Oral  Oral Oral  SpO2: 97% 94%  95%  Weight:    64 kg  Height:        Intake/Output Summary (Last 24 hours) at 04/25/2019 0808 Last data filed at 04/25/2019 0330 Gross per 24 hour  Intake 240 ml  Output 3000 ml  Net -2760 ml   Filed Weights   04/24/19 0738 04/24/19 1150 04/25/19 0626  Weight: 68.4 kg 64.4 kg 64 kg   Exam  General: Well developed, well nourished, NAD, appears stated age  52: NCAT, mucous membranes moist.   Cardiovascular: S1 S2 auscultated, RRR, no murmur  Respiratory: Diminished however clear  Abdomen: Soft, nontender, nondistended, + bowel sounds  Extremities: warm dry without cyanosis clubbing or edema  Neuro: AAOx3, nonfocal  Psych: Appropriate mood and affect  Data Reviewed: I have personally reviewed following labs and imaging studies  CBC: Recent Labs  Lab 04/20/19 0650 04/21/19 0218 04/22/19 0356 04/23/19 0354 04/25/19 0236  WBC 11.9* 10.2 11.5* 11.9* 10.3  HGB 8.0* 8.5* 8.8* 8.2* 9.0*  HCT 24.4* 26.2* 27.0* 25.8* 28.5*  MCV 69.9* 70.2* 70.5* 70.9* 71.8*  PLT 478* 396 367 254 249   Basic  Metabolic Panel: Recent Labs  Lab 04/20/19 0650 04/22/19 0356 04/23/19 0354 04/23/19 1413 04/24/19 0512 04/25/19 0236  NA 138 137 137  --  135 135  K 3.8 4.3 3.7  --  3.3* 4.2  CL 102 97* 96*  --  98 95*  CO2 22 24 28   --  25 26  GLUCOSE 140* 129* 96  --  97 85  BUN 46* 49* 24*  --  43* 25*  CREATININE 6.32* 6.09* 3.88*  --  6.29* 4.82*  CALCIUM 8.3* 8.6* 8.3*  --  8.3* 8.8*  PHOS  --   --   --  4.7*  --  4.4   GFR: Estimated Creatinine Clearance: 13.4 mL/min (A) (by C-G formula based on SCr of 4.82  mg/dL (H)). Liver Function Tests: Recent Labs  Lab 04/19/19 0123 04/25/19 0236  AST 14*  --   ALT 11  --   ALKPHOS 81  --   BILITOT 0.4  --   PROT 6.8  --   ALBUMIN 2.9* 2.9*   No results for input(s): LIPASE, AMYLASE in the last 168 hours. No results for input(s): AMMONIA in the last 168 hours. Coagulation Profile: No results for input(s): INR, PROTIME in the last 168 hours. Cardiac Enzymes: No results for input(s): CKTOTAL, CKMB, CKMBINDEX, TROPONINI in the last 168 hours. BNP (last 3 results) No results for input(s): PROBNP in the last 8760 hours. HbA1C: No results for input(s): HGBA1C in the last 72 hours. CBG: No results for input(s): GLUCAP in the last 168 hours. Lipid Profile: No results for input(s): CHOL, HDL, LDLCALC, TRIG, CHOLHDL, LDLDIRECT in the last 72 hours. Thyroid Function Tests: No results for input(s): TSH, T4TOTAL, FREET4, T3FREE, THYROIDAB in the last 72 hours. Anemia Panel: No results for input(s): VITAMINB12, FOLATE, FERRITIN, TIBC, IRON, RETICCTPCT in the last 72 hours. Urine analysis:    Component Value Date/Time   COLORURINE STRAW (A) 04/20/2019 2333   APPEARANCEUR CLEAR 04/20/2019 2333   LABSPEC 1.010 04/20/2019 2333   PHURINE 7.0 04/20/2019 2333   GLUCOSEU 50 (A) 04/20/2019 2333   HGBUR SMALL (A) 04/20/2019 2333   BILIRUBINUR NEGATIVE 04/20/2019 2333   [place] 04/20/2019 2333   PROTEINUR 100 (A) 04/20/2019 2333   NITRITE NEGATIVE 04/20/2019 2333   LEUKOCYTESUR NEGATIVE 04/20/2019 2333   Sepsis Labs: @LABRCNTIP (procalcitonin:4,lacticidven:4)  ) Recent Results (from the past 240 hour(s))  SARS CORONAVIRUS 2 (TAT 6-24 HRS) Nasopharyngeal Nasopharyngeal Swab     Status: None   Collection Time: 04/18/19  2:00 AM   Specimen: Nasopharyngeal Swab  Result Value Ref Range Status   SARS Coronavirus 2 NEGATIVE NEGATIVE Final    Comment: (NOTE) SARS-CoV-2 target nucleic acids are NOT DETECTED. The SARS-CoV-2 RNA is generally  detectable in upper and lower respiratory specimens during the acute phase of infection. Negative results do not preclude SARS-CoV-2 infection, do not rule out co-infections with other pathogens, and should not be used as the sole basis for treatment or other patient management decisions. Negative results must be combined with clinical observations, patient history, and epidemiological information. The expected result is Negative. Fact Sheet for Patients: SugarRoll.be Fact Sheet for Healthcare Providers: https://www.woods-mathews.com/ This test is not yet approved or cleared by the [place] FDA and  has been authorized for detection and/or diagnosis of SARS-CoV-2 by FDA under an Emergency Use Authorization (EUA). This EUA will remain  in effect (meaning this test can be used) for the duration of the COVID-19 declaration under Section 56 4(b)(1) of  the Act, 21 U.S.C. section 360bbb-3(b)(1), unless the authorization is terminated or revoked sooner. Performed at [facility], [place] [address] [place], [place] [address]   Blood Culture (routine x 2)     Status: None   Collection Time: 04/18/19  4:04 AM   Specimen: BLOOD  Result Value Ref Range Status   Specimen Description BLOOD RIGHT ARM  Final   Special Requests   Final    BOTTLES DRAWN AEROBIC AND ANAEROBIC Blood Culture adequate volume   Culture   Final    NO GROWTH 5 DAYS Performed at [facility], [place] [address] [place], [place] [address]    Report Status 04/23/2019 FINAL  Final  Blood Culture (routine x 2)     Status: None   Collection Time: 04/18/19  5:40 AM   Specimen: BLOOD  Result Value Ref Range Status   Specimen Description BLOOD SITE NOT SPECIFIED  Final   Special Requests   Final    BOTTLES DRAWN AEROBIC AND ANAEROBIC Blood Culture results may not be optimal due to an inadequate volume of blood received in culture bottles   Culture   Final    NO GROWTH 5  DAYS Performed at [facility], [place] [address] [place], [place] [address]    Report Status 04/23/2019 FINAL  Final  Body fluid culture (includes gram stain)     Status: None (Preliminary result)   Collection Time: 04/24/19  4:51 PM   Specimen: Pleural Fluid  Result Value Ref Range Status   Specimen Description FLUID PLEURAL  Final   Special Requests NONE  Final   Gram Stain   Final    RARE WBC PRESENT, PREDOMINANTLY MONONUCLEAR NO ORGANISMS SEEN    Culture   Final    NO GROWTH < 24 HOURS Performed at [facility], [place] [address], [place], [place] [address]    Report Status PENDING  Incomplete      Radiology Studies: DG Chest 2 View  Result Date: 04/24/2019 CLINICAL DATA:  Shortness of breath EXAM: CHEST - 2 VIEW COMPARISON:  04/18/2019 FINDINGS: No significant change in moderate right, small left pleural effusions with associated atelectasis or consolidation. Interval placement of right chest large bore multi lumen vascular catheter. Cardiomegaly. IMPRESSION: 1. No significant change in moderate right, small left pleural effusions with associated atelectasis or consolidation. 2. Interval placement of right chest large bore multi lumen vascular catheter. 3.  Cardiomegaly. Electronically Signed   By: Eddie Candle M.D.   On: 04/24/2019 14:43   DG CHEST PORT 1 VIEW  Result Date: 04/24/2019 CLINICAL DATA:  Post RIGHT thoracentesis. EXAM: PORTABLE CHEST 1 VIEW 5:28 p.m.: COMPARISON:  Portable chest x-ray earlier same day at 2:02 p.m. and previously. FINDINGS: No evidence of pneumothorax after RIGHT thoracentesis. Resolution of the RIGHT pleural effusion. Improved aeration at the RIGHT lung base with only minimal atelectasis persisting. Stable small LEFT pleural effusion and mild LEFT basilar atelectasis. No new pulmonary parenchymal abnormalities. RIGHT jugular dialysis catheter tips project at or near the cavoatrial junction, unchanged IMPRESSION: 1. No pneumothorax after RIGHT  thoracentesis. 2. Resolution of the RIGHT pleural effusion. 3. Improved aeration at the RIGHT lung base with only minimal atelectasis persisting. 4. Stable small LEFT pleural effusion and mild LEFT basilar atelectasis. Electronically Signed   By: Evangeline Dakin M.D.   On: 04/24/2019 19:34     Scheduled Meds: . Chlorhexidine Gluconate Cloth  6 each Topical Daily  . cloNIDine  0.1  mg Transdermal Weekly  . darbepoetin (ARANESP) injection - DIALYSIS  40 mcg Intravenous Q Sat-HD  . diclofenac Sodium  4 g Topical QID  . heparin injection (subcutaneous)  5,000 Units Subcutaneous Q8H  . hydrALAZINE  100 mg Oral TID  . labetalol  300 mg Oral BID  . NIFEdipine  90 mg Oral BID  . predniSONE  20 mg Oral Q breakfast  . sodium chloride flush  3 mL Intravenous Once   Continuous Infusions:   LOS: 6 days   Time Spent in minutes   45 minutes  Pablo Mathurin D.O. on 04/25/2019 at 8:08 AM  Between 7am to 7pm - Please see pager noted on [email or web address]  After 7pm go to [email or web address]  And look for the night coverage person covering for me after hours  [facility] Office  [phone]

## 2019-04-26 LAB — CBC
HCT: 25.8 % — ABNORMAL LOW (ref 36.0–46.0)
Hemoglobin: 8.3 g/dL — ABNORMAL LOW (ref 12.0–15.0)
MCH: 23.4 pg — ABNORMAL LOW (ref 26.0–34.0)
MCHC: 32.2 g/dL (ref 30.0–36.0)
MCV: 72.7 fL — ABNORMAL LOW (ref 80.0–100.0)
Platelets: 237 10*3/uL (ref 150–400)
RBC: 3.55 MIL/uL — ABNORMAL LOW (ref 3.87–5.11)
RDW: 18.3 % — ABNORMAL HIGH (ref 11.5–15.5)
WBC: 7.3 10*3/uL (ref 4.0–10.5)
nRBC: 0 % (ref 0.0–0.2)

## 2019-04-26 LAB — RENAL FUNCTION PANEL
Albumin: 2.8 g/dL — ABNORMAL LOW (ref 3.5–5.0)
Anion gap: 17 — ABNORMAL HIGH (ref 5–15)
BUN: 47 mg/dL — ABNORMAL HIGH (ref 6–20)
CO2: 24 mmol/L (ref 22–32)
Calcium: 8.8 mg/dL — ABNORMAL LOW (ref 8.9–10.3)
Chloride: 94 mmol/L — ABNORMAL LOW (ref 98–111)
Creatinine, Ser: 8.06 mg/dL — ABNORMAL HIGH (ref 0.44–1.00)
GFR calc Af Amer: 7 mL/min — ABNORMAL LOW (ref >60)
GFR calc non Af Amer: 6 mL/min — ABNORMAL LOW (ref >60)
Glucose, Bld: 88 mg/dL (ref 70–99)
Phosphorus: 7.4 mg/dL — ABNORMAL HIGH (ref 2.5–4.6)
Potassium: 3.6 mmol/L (ref 3.5–5.1)
Sodium: 135 mmol/L (ref 135–145)

## 2019-04-26 LAB — CYTOLOGY - NON PAP

## 2019-04-26 LAB — PH, BODY FLUID: pH, Body Fluid: 7.9

## 2019-04-26 MED ORDER — CLONIDINE 0.1 MG/24HR TD PTWK: 0.1000 mg | MEDICATED_PATCH | TRANSDERMAL | 12 refills | Status: DC

## 2019-04-26 MED ORDER — OXYCODONE HCL 5 MG PO TABS: 5.0000 mg | ORAL_TABLET | ORAL | 0 refills | Status: DC | PRN

## 2019-04-26 MED ORDER — ONDANSETRON 4 MG PO TBDP
8.0000 mg | ORAL_TABLET | Freq: Once | ORAL | Status: AC
  Administered 2019-04-26: 13:00:00 8 mg via ORAL
  Filled 2019-04-26: qty 2

## 2019-04-26 MED ORDER — NIFEDIPINE ER OSMOTIC RELEASE 90 MG PO TB24: 90.0000 mg | ORAL_TABLET | Freq: Two times a day (BID) | ORAL | 1 refills | Status: DC

## 2019-04-26 MED ORDER — OXYCODONE HCL 5 MG PO TABS
ORAL_TABLET | ORAL | Status: AC
  Administered 2019-04-26: 5 mg via ORAL
  Filled 2019-04-26: qty 1

## 2019-04-26 MED ORDER — HEPARIN SODIUM (PORCINE) 1000 UNIT/ML IJ SOLN
INTRAMUSCULAR | Status: AC
  Administered 2019-04-26: 1000 [IU]
  Filled 2019-04-26: qty 4

## 2019-04-26 MED ORDER — HYDROMORPHONE HCL 1 MG/ML IJ SOLN
INTRAMUSCULAR | Status: AC
  Filled 2019-04-26: qty 0.5

## 2019-04-26 MED ORDER — HYDRALAZINE HCL 100 MG PO TABS: 100.0000 mg | ORAL_TABLET | Freq: Three times a day (TID) | ORAL | 1 refills | Status: DC

## 2019-04-26 MED ORDER — DICLOFENAC SODIUM 1 % EX GEL: 4.0000 g | Freq: Four times a day (QID) | CUTANEOUS | 1 refills | Status: DC

## 2019-04-26 MED ORDER — DARBEPOETIN ALFA 40 MCG/0.4ML IJ SOSY: 40.0000 ug | PREFILLED_SYRINGE | INTRAMUSCULAR | Status: DC

## 2019-04-26 NOTE — Plan of Care (Signed)

## 2019-04-26 NOTE — Progress Notes (Addendum)
Renal Navigator checked in with patient to confirm her plan to start OP HD Monday, 04/29/19. She states she is feeling better and ready to go home today. She understands plan to start at her OP HD clinic/[facility] on Monday at 7:00am-clinic notified of patient's discharge/start date.  Patient is cleared for discharge from an OP HD standpoint.  Alphonzo Cruise, [place] Renal Navigator [phone]

## 2019-04-26 NOTE — Discharge Instructions (Signed)
Pleural Effusion Pleural effusion is an abnormal buildup of fluid in the layers of tissue between the lungs and the inside of the chest (pleural space) The two layers of tissue that line the lungs and the inside of the chest are called pleura. Usually, there is no air in the space between the pleura, only a thin layer of fluid. Some conditions can cause a large amount of fluid to build up, which can cause the lung to collapse if untreated. A pleural effusion is usually caused by another disease that requires treatment. What are the causes? Pleural effusion can be caused by:  Heart failure.  Certain infections, such as pneumonia or tuberculosis.  Cancer.  A blood clot in the lung (pulmonary embolism).  Complications from surgery, such as from open heart surgery.  Liver disease (cirrhosis).  Kidney disease. What are the signs or symptoms? In some cases, pleural effusion may cause no symptoms. If symptoms are present, they may include:  Shortness of breath, especially when lying down.  Chest pain. This may get worse when taking a deep breath.  Fever.  Dry, long-lasting (chronic) cough.  Hiccups.  Rapid breathing. An underlying condition that is causing the pleural effusion (such as heart failure, pneumonia, blood clots, tuberculosis, or cancer) may also cause other symptoms. How is this diagnosed? This condition may be diagnosed based on:  Your symptoms and medical history.  A physical exam.  A chest X-ray.  A procedure to use a needle to remove fluid from the pleural space (thoracentesis). This fluid is tested.  Other imaging studies of the chest, such as ultrasound or CT scan. How is this treated? Depending on the cause of your condition, treatment may include:  Treating the underlying condition that is causing the effusion. When that condition improves, the effusion will also improve. Examples of treatment for underlying conditions include: ? Antibiotic medicines to  treat an infection. ? Diuretics or other heart medicines to treat heart failure.  Thoracentesis.  Placing a thin flexible tube under your skin and into your chest to continuously drain the effusion (indwelling pleural catheter).  Surgery to remove the outer layer of tissue from the pleural space (decortication).  A procedure to put medicine into the chest cavity to seal the pleural space and prevent fluid buildup (pleurodesis).  Chemotherapy and radiation therapy, if you have cancerous (malignant) pleural effusion. These treatments are typically used to treat cancer. They kill certain cells in the body. Follow these instructions at home:  Take over-the-counter and prescription medicines only as told by your health care provider.  Ask your health care provider what activities are safe for you.  Keep track of how long you are able to do mild exercise (such as walking) before you get short of breath. Write down this information to share with your health care provider. Your ability to exercise should improve over time.  Do not use any products that contain nicotine or tobacco, such as cigarettes and e-cigarettes. If you need help quitting, ask your health care provider.  Keep all follow-up visits as told by your health care provider. This is important. Contact a health care provider if:  The amount of time that you are able to do mild exercise: ? Decreases. ? Does not improve with time.  You have a fever. Get help right away if:  You are short of breath.  You develop chest pain.  You develop a new cough. Summary  Pleural effusion is an abnormal buildup of fluid in the layers   of tissue between the lungs and the inside of the chest.  Pleural effusion can have many causes, including heart failure, pulmonary embolism, infections, or cancer.  Symptoms of pleural effusion can include shortness of breath, chest pain, fever, long-lasting (chronic) cough, hiccups, or rapid  breathing.  Diagnosis often involves making images of the chest (such as with ultrasound or X-ray) and removing fluid (thoracentesis) to send for testing.  Treatment for pleural effusion depends on what underlying condition is causing it. This information is not intended to replace advice given to you by your health care provider. Make sure you discuss any questions you have with your health care provider. Document Revised: 02/03/2017 Document Reviewed: 10/27/2016 Elsevier Patient Education  2020 [employer]  

## 2019-04-26 NOTE — Progress Notes (Signed)
Subjective:  Seen on hd , no cos, tolerating uf  With plans for dc noted today   Objective Vital signs in last 24 hours: Vitals:   04/25/19 2144 04/26/19 0039 04/26/19 0620 04/26/19 0742  BP: (!) 158/99 (!) 140/100 (!) 140/95 115/77  Pulse: 81 79 79 79  Resp:  17 17   Temp:  99 F (37.2 C) 98.1 F (36.7 C)   TempSrc:  Oral Oral   SpO2: 96% 94% 93%   Weight:   63.7 kg   Height:       Weight change: -4.7 kg  Physical Exam General: Alert on HD , young woman, NAD Heart: RRR; no murmur or tub Lungs: CTA  Unlabored  Abdomen: soft, Nt, ND  PD cath site clean /dry bandage  Extremities: No pedal edema Dialysis Access: TDC in R chest patent on hd    Dialysis Orders: Has outpatient HD slot - MWF 7am at [facility] ([facility]) CAPD -> establishing new hemodialysis orders, dry weight.  Problem/Plan: 1.Dyspnea/Pleuritic CP/ Pleural effusion = Recent abx for pneumonia - repeat CXRon admissionwith unchanged RLL consolidation and R effusion. Intermediate prob for PE per nuclear scan.Repeat CXR with persistent effusion - s/p R thora 2/16 - gram stain with WBC only, ^ LDH, Cx neg = Lupus and PD failure etiology , resolve symptoms now sp HD uf and  Thora.  2. ESRD: Transitioned from PD to HD on MWF sched. S/p [facility] 2/15. OP with [facility] MWF  7am  3.HTN/volume: BP much improved with challenging down weight and med tittration. 4. Anemia: Hgb 9 - >8.3 on Aranesp q Sat. Tsat 36%.  5.Secondaryhyperparathyroidism: Ca=okay Phos =7.4  Not on binders she states she has binders at home will start at home no VDRA for now. 6. SLE:On prednisone 20 mgQD   Ernest Haber, PA-C [facility] Beeper [phone] 04/26/2019,7:58 AM  LOS: 7 days   Labs: Basic Metabolic Panel: Recent Labs  Lab 04/23/19 0354 04/23/19 1413 04/24/19 0512 04/25/19 0236 04/26/19 0538  NA   < >  --  135 135 135  K   < >  --  3.3* 4.2 3.6  CL   < >  --  98 95* 94*  CO2   < >  --  25 26 24   GLUCOSE   < >  --  97 85  88  BUN   < >  --  43* 25* 47*  CREATININE   < >  --  6.29* 4.82* 8.06*  CALCIUM   < >  --  8.3* 8.8* 8.8*  PHOS  --  4.7*  --  4.4 7.4*   < > = values in this interval not displayed.   Liver Function Tests: Recent Labs  Lab 04/25/19 0236 04/26/19 0538  ALBUMIN 2.9* 2.8*   No results for input(s): LIPASE, AMYLASE in the last 168 hours. No results for input(s): AMMONIA in the last 168 hours. CBC: Recent Labs  Lab 04/21/19 0218 04/21/19 0218 04/22/19 0356 04/22/19 0356 04/23/19 0354 04/25/19 0236 04/26/19 0538  WBC 10.2   < > 11.5*   < > 11.9* 10.3 7.3  HGB 8.5*   < > 8.8*   < > 8.2* 9.0* 8.3*  HCT 26.2*   < > 27.0*   < > 25.8* 28.5* 25.8*  MCV 70.2*  --  70.5*  --  70.9* 71.8* 72.7*  PLT 396   < > 367   < > 254 249 237   < > =  values in this interval not displayed.   Cardiac Enzymes: No results for input(s): CKTOTAL, CKMB, CKMBINDEX, TROPONINI in the last 168 hours. CBG: No results for input(s): GLUCAP in the last 168 hours.  Studies/Results: DG Chest 2 View  Result Date: 04/24/2019 CLINICAL DATA:  Shortness of breath EXAM: CHEST - 2 VIEW COMPARISON:  04/18/2019 FINDINGS: No significant change in moderate right, small left pleural effusions with associated atelectasis or consolidation. Interval placement of right chest large bore multi lumen vascular catheter. Cardiomegaly. IMPRESSION: 1. No significant change in moderate right, small left pleural effusions with associated atelectasis or consolidation. 2. Interval placement of right chest large bore multi lumen vascular catheter. 3.  Cardiomegaly. Electronically Signed   By: Eddie Candle M.D.   On: 04/24/2019 14:43   DG CHEST PORT 1 VIEW  Result Date: 04/24/2019 CLINICAL DATA:  Post RIGHT thoracentesis. EXAM: PORTABLE CHEST 1 VIEW 5:28 p.m.: COMPARISON:  Portable chest x-ray earlier same day at 2:02 p.m. and previously. FINDINGS: No evidence of pneumothorax after RIGHT thoracentesis. Resolution of the RIGHT pleural  effusion. Improved aeration at the RIGHT lung base with only minimal atelectasis persisting. Stable small LEFT pleural effusion and mild LEFT basilar atelectasis. No new pulmonary parenchymal abnormalities. RIGHT jugular dialysis catheter tips project at or near the cavoatrial junction, unchanged IMPRESSION: 1. No pneumothorax after RIGHT thoracentesis. 2. Resolution of the RIGHT pleural effusion. 3. Improved aeration at the RIGHT lung base with only minimal atelectasis persisting. 4. Stable small LEFT pleural effusion and mild LEFT basilar atelectasis. Electronically Signed   By: Evangeline Dakin M.D.   On: 04/24/2019 19:34   Medications:  . Chlorhexidine Gluconate Cloth  6 each Topical Daily  . cloNIDine  0.1 mg Transdermal Weekly  . darbepoetin (ARANESP) injection - DIALYSIS  40 mcg Intravenous Q Sat-HD  . diclofenac Sodium  4 g Topical QID  . heparin injection (subcutaneous)  5,000 Units Subcutaneous Q8H  . hydrALAZINE  100 mg Oral TID  . labetalol  300 mg Oral BID  . NIFEdipine  90 mg Oral BID  . predniSONE  20 mg Oral Q breakfast  . sodium chloride flush  3 mL Intravenous Once

## 2019-04-27 LAB — BODY FLUID CULTURE: Culture: NO GROWTH

## 2019-08-27 ENCOUNTER — Encounter (HOSPITAL_COMMUNITY): Payer: Self-pay | Admitting: Emergency Medicine

## 2019-08-27 ENCOUNTER — Other Ambulatory Visit: Payer: Self-pay

## 2019-08-27 ENCOUNTER — Emergency Department (HOSPITAL_COMMUNITY)
Admission: EM | Admit: 2019-08-27 | Discharge: 2019-08-28 | Disposition: A | Discharge status: Home / Self Care | Payer: Medicare Other | Attending: Emergency Medicine | Admitting: Emergency Medicine

## 2019-08-27 DIAGNOSIS — E8771 Transfusion associated circulatory overload: Secondary | ICD-10-CM | POA: Insufficient documentation

## 2019-08-27 DIAGNOSIS — Z79899 Other long term (current) drug therapy: Secondary | ICD-10-CM | POA: Insufficient documentation

## 2019-08-27 DIAGNOSIS — R519 Headache, unspecified: Secondary | ICD-10-CM | POA: Insufficient documentation

## 2019-08-27 DIAGNOSIS — N186 End stage renal disease: Secondary | ICD-10-CM | POA: Insufficient documentation

## 2019-08-27 DIAGNOSIS — Z992 Dependence on renal dialysis: Secondary | ICD-10-CM | POA: Diagnosis not present

## 2019-08-27 DIAGNOSIS — F17210 Nicotine dependence, cigarettes, uncomplicated: Secondary | ICD-10-CM | POA: Diagnosis not present

## 2019-08-27 LAB — CBC
HCT: 34.5 % — ABNORMAL LOW (ref 36.0–46.0)
Hemoglobin: 11.3 g/dL — ABNORMAL LOW (ref 12.0–15.0)
MCH: 26.2 pg (ref 26.0–34.0)
MCHC: 32.8 g/dL (ref 30.0–36.0)
MCV: 80 fL (ref 80.0–100.0)
Platelets: 209 10*3/uL (ref 150–400)
RBC: 4.31 MIL/uL (ref 3.87–5.11)
RDW: 16.1 % — ABNORMAL HIGH (ref 11.5–15.5)
WBC: 8.2 10*3/uL (ref 4.0–10.5)
nRBC: 0 % (ref 0.0–0.2)

## 2019-08-27 LAB — BASIC METABOLIC PANEL
Anion gap: 15 (ref 5–15)
BUN: 60 mg/dL — ABNORMAL HIGH (ref 6–20)
CO2: 20 mmol/L — ABNORMAL LOW (ref 22–32)
Calcium: 9.5 mg/dL (ref 8.9–10.3)
Chloride: 103 mmol/L (ref 98–111)
Creatinine, Ser: 9.1 mg/dL — ABNORMAL HIGH (ref 0.44–1.00)
GFR calc Af Amer: 6 mL/min — ABNORMAL LOW (ref >60)
GFR calc non Af Amer: 5 mL/min — ABNORMAL LOW (ref >60)
Glucose, Bld: 100 mg/dL — ABNORMAL HIGH (ref 70–99)
Potassium: 5.5 mmol/L — ABNORMAL HIGH (ref 3.5–5.1)
Sodium: 138 mmol/L (ref 135–145)

## 2019-08-27 MED ORDER — SODIUM CHLORIDE 0.9% FLUSH: 3.0000 mL | Freq: Once | INTRAVENOUS | Status: DC

## 2019-08-27 NOTE — ED Triage Notes (Signed)
Patient arrives to ED with [facility] to triage with complaints of a frontal headache that started today at 1215. Patient states she's never experienced a headache this bad in the past. Patient is a dialysis patient was only completed one hour of her dialysis treatment yesterday.

## 2019-08-28 ENCOUNTER — Emergency Department (HOSPITAL_COMMUNITY): Payer: Medicare Other

## 2019-08-28 DIAGNOSIS — R519 Headache, unspecified: Secondary | ICD-10-CM | POA: Diagnosis not present

## 2019-08-28 LAB — I-STAT BETA HCG BLOOD, ED (MC, WL, AP ONLY): I-stat hCG, quantitative: <5 m[IU]/mL (ref <5)

## 2019-08-28 LAB — BASIC METABOLIC PANEL
Anion gap: 17 — ABNORMAL HIGH (ref 5–15)
BUN: 65 mg/dL — ABNORMAL HIGH (ref 6–20)
CO2: 17 mmol/L — ABNORMAL LOW (ref 22–32)
Calcium: 9.2 mg/dL (ref 8.9–10.3)
Chloride: 107 mmol/L (ref 98–111)
Creatinine, Ser: 9.4 mg/dL — ABNORMAL HIGH (ref 0.44–1.00)
GFR calc Af Amer: 5 mL/min — ABNORMAL LOW (ref >60)
GFR calc non Af Amer: 5 mL/min — ABNORMAL LOW (ref >60)
Glucose, Bld: 89 mg/dL (ref 70–99)
Potassium: 4.8 mmol/L (ref 3.5–5.1)
Sodium: 141 mmol/L (ref 135–145)

## 2019-08-28 LAB — URINALYSIS, ROUTINE W REFLEX MICROSCOPIC
Bacteria, UA: NONE SEEN
Bilirubin Urine: NEGATIVE
Glucose, UA: NEGATIVE mg/dL
Ketones, ur: NEGATIVE mg/dL
Nitrite: NEGATIVE
Protein, ur: >=300 mg/dL — AB
Specific Gravity, Urine: 1.011 (ref 1.005–1.030)
pH: 6 (ref 5.0–8.0)

## 2019-08-28 MED ORDER — NIFEDIPINE ER OSMOTIC RELEASE 90 MG PO TB24
90.0000 mg | ORAL_TABLET | Freq: Once | ORAL | Status: AC
  Administered 2019-08-28: 90 mg via ORAL
  Filled 2019-08-28: qty 1

## 2019-08-28 MED ORDER — SODIUM ZIRCONIUM CYCLOSILICATE 10 G PO PACK
10.0000 g | PACK | Freq: Once | ORAL | Status: AC
  Administered 2019-08-28: 10 g via ORAL
  Filled 2019-08-28: qty 1

## 2019-08-28 MED ORDER — LABETALOL HCL 200 MG PO TABS
300.0000 mg | ORAL_TABLET | Freq: Once | ORAL | Status: AC
  Administered 2019-08-28: 300 mg via ORAL
  Filled 2019-08-28: qty 2

## 2019-08-28 MED ORDER — DIPHENHYDRAMINE HCL 50 MG/ML IJ SOLN
25.0000 mg | Freq: Once | INTRAMUSCULAR | Status: AC
  Administered 2019-08-28: 25 mg via INTRAVENOUS
  Filled 2019-08-28: qty 1

## 2019-08-28 MED ORDER — PROCHLORPERAZINE EDISYLATE 10 MG/2ML IJ SOLN
10.0000 mg | Freq: Once | INTRAMUSCULAR | Status: AC
  Administered 2019-08-28: 10 mg via INTRAVENOUS
  Filled 2019-08-28: qty 2

## 2019-08-28 MED ORDER — LABETALOL HCL 5 MG/ML IV SOLN
20.0000 mg | Freq: Once | INTRAVENOUS | Status: AC
  Administered 2019-08-28: 20 mg via INTRAVENOUS
  Filled 2019-08-28: qty 4

## 2019-08-28 MED ORDER — HYDRALAZINE HCL 25 MG PO TABS
100.0000 mg | ORAL_TABLET | Freq: Once | ORAL | Status: AC
  Administered 2019-08-28: 100 mg via ORAL
  Filled 2019-08-28: qty 4

## 2019-08-28 MED ORDER — DEXAMETHASONE SODIUM PHOSPHATE 10 MG/ML IJ SOLN
10.0000 mg | Freq: Once | INTRAMUSCULAR | Status: AC
  Administered 2019-08-28: 10 mg via INTRAVENOUS
  Filled 2019-08-28: qty 1

## 2019-08-28 NOTE — Discharge Instructions (Addendum)
Thank you for allowing me to care for you today in the Emergency Department.   You were seen today for a headache.  Your headache improved after migraine cocktail.  Your head CT did not show a stroke.  It is very important that you go to your dialysis appointment this morning.  This will help to control your blood pressure.  You can take 650 mg of Tylenol once every 6 hours for headache.  Follow-up with primary care if you develop recurrent headaches.  Return to the emergency department if you develop a sudden onset, severe headache, especially if you develop new numbness, weakness, slurred speech, changes in your vision, fever, unable to walk, or develop other new, concerning symptoms.  You should also return to the emergency department if you miss dialysis and develop shortness of breath, significant swelling, confusion, or other new, concerning symptoms.

## 2019-08-28 NOTE — ED Provider Notes (Signed)
[facility] EMERGENCY DEPARTMENT Provider Note   CSN: [id number] Arrival date & time: 08/27/19  1319     History Chief Complaint  Patient presents with  . Headache    Latasha Drake is a 39 y.o. female with a history of ESRD on HD (M/W/F), systemic lupus erythematous and history of lupus nephritis, and uncontrolled hypertension who presents the emergency department with a chief complaint of headache.  The patient reports a sudden onset, 10/10, sharp, nonradiating bilateral frontal headache that is also located behind her bilateral eyes.  Headache is worse with laying flat and improved with sitting upright.  She reports that the headache began after getting out of the shower this afternoon.  States that she checked her blood pressure at that time and it was 130/80.  She reports that it is minimally decreased in intensity since onset.  No treatment prior to arrival.  She denies numbness, weakness, visual changes, dizziness, lightheadedness, slurred speech, or visual changes.  She is on dialysis on Monday, Wednesday, and Friday.  She only completed 1 hour of her session on 6/21 because she has restless leg and her symptoms became an bearable during her session.  She reports that she has previously had to leave sessions early as during the last 6 months since she started on dialysis, but has never developed a severe headache before.  She denies shortness of breath, chest pain, abdominal pain, difficulty urinating, fever, chills, leg swelling, URI symptoms, confusion.   She has been compliant with her home antihypertensives, but has not taken her nighttime dose since she has been in the ER waiting room.  The history is provided by the patient. No language interpreter was used.       Past Medical History:  Diagnosis Date  . Anemia   . Chronic kidney disease   . Hypertension   . Lupus ([place])   . Pericardial effusion 04/13/2019   circumferential  . Pneumonia 04/12/2019  .  Pulmonary hypertension ([place]) 04/13/2019   moderate    Patient Active Problem List   Diagnosis Date Noted  . Pleural effusion   . Acute respiratory failure with hypoxia ([place]) 04/19/2019  . Chest pain 04/13/2019  . ESRD (end stage renal disease) ([place]) 04/13/2019  . Pneumonia 04/12/2019  . Acute on chronic blood loss anemia 10/04/2018  . Symptomatic anemia 10/04/2018  . Lupus ([place])   . Hypertension   . Chronic kidney disease   . Acute renal failure superimposed on chronic kidney disease ([place])   . Elevated d-dimer   . Pericardial effusion   . Traumatic perinephric hematoma of left kidney     Past Surgical History:  Procedure Laterality Date  . CESAREAN SECTION    . HERNIA REPAIR    . IR FLUORO GUIDE CV LINE RIGHT  04/19/2019  . IR FLUORO GUIDE CV LINE RIGHT  04/22/2019  . IR US GUIDE VASC ACCESS RIGHT  04/19/2019  . IR US GUIDE VASC ACCESS RIGHT  04/22/2019     OB History   No obstetric history on file.     Family History  Problem Relation Age of Onset  . Lupus Other        cousin    Social History   Tobacco Use  . Smoking status: Current Every Day Smoker    Packs/day: 0.50    Types: Cigarettes  . Smokeless tobacco: Never Used  Substance Use Topics  . Alcohol use: Yes    Comment: occassionally  . Drug use: No  Home Medications Prior to Admission medications   Medication Sig Start Date End Date Taking? Authorizing Provider  cloNIDine (CATAPRES - DOSED IN MG/24 HR) 0.1 mg/24hr patch Place 1 patch (0.1 mg total) onto the skin once a week. 04/29/19   Mikhail, Velta Addison, DO  cyclobenzaprine (FLEXERIL) 10 MG tablet Take 10 mg by mouth 3 (three) times daily as needed for muscle spasms.  04/03/19   [provider]  diclofenac Sodium (VOLTAREN) 1 % GEL Apply 4 g topically 4 (four) times daily. 04/26/19   Mikhail, Velta Addison, DO  hydrALAZINE (APRESOLINE) 100 MG tablet Take 1 tablet (100 mg total) by mouth 3 (three) times daily. 04/26/19   Mikhail, Velta Addison, DO  labetalol  (NORMODYNE) 300 MG tablet Take 300 mg by mouth 2 (two) times daily.    [provider]  NIFEdipine (PROCARDIA XL/NIFEDICAL-XL) 90 MG 24 hr tablet Take 1 tablet (90 mg total) by mouth 2 (two) times daily. 04/26/19   Mikhail, Velta Addison, DO  ondansetron (ZOFRAN) 4 MG tablet Take 1 tablet (4 mg total) by mouth every 8 (eight) hours as needed for nausea or vomiting. 05/28/15   Mackuen, Courteney Lyn, MD  oxyCODONE (OXY IR/ROXICODONE) 5 MG immediate release tablet Take 1 tablet (5 mg total) by mouth every 4 (four) hours as needed for breakthrough pain. 04/26/19   Mikhail, Velta Addison, DO  pantoprazole (PROTONIX) 40 MG tablet Take 40 mg by mouth daily as needed (indigestion).  10/27/16   [provider]  paragard intrauterine copper IUD IUD 1 Intra Uterine Device by Intrauterine route once.    [provider]  predniSONE (DELTASONE) 20 MG tablet Take 20 mg by mouth daily.  01/23/19   [provider]  Vitamin D, Ergocalciferol, (DRISDOL) 50000 units CAPS capsule Take 50,000 Units by mouth every Sunday. Take on Sun. 06/23/15   [provider]    Allergies    Lisinopril  Review of Systems   Review of Systems  Constitutional: Negative for activity change, chills, diaphoresis and fever.  HENT: Negative for congestion.   Respiratory: Negative for cough, shortness of breath and wheezing.   Cardiovascular: Negative for chest pain, palpitations and leg swelling.  Gastrointestinal: Negative for abdominal pain, blood in stool, constipation, nausea and vomiting.  Genitourinary: Negative for dysuria.  Musculoskeletal: Negative for back pain.  Skin: Negative for rash.  Allergic/Immunologic: Negative for immunocompromised state.  Neurological: Positive for headaches. Negative for dizziness, seizures, syncope, weakness, light-headedness and numbness.  Psychiatric/Behavioral: Negative for confusion.    Physical Exam Updated Vital Signs BP (!) 180/116   Pulse 97   Temp 97.8  F (36.6 C) (Oral)   Resp 19   Ht 5\' 4"  (1.626 m)   Wt 68 kg   SpO2 100%   BMI 25.75 kg/m   Physical Exam Vitals and nursing note reviewed.  Constitutional:      General: She is not in acute distress.    Appearance: She is not ill-appearing, toxic-appearing or diaphoretic.  HENT:     Head: Normocephalic.  Eyes:     Extraocular Movements: Extraocular movements intact.     Conjunctiva/sclera: Conjunctivae normal.     Pupils: Pupils are equal, round, and reactive to light.  Cardiovascular:     Rate and Rhythm: Normal rate and regular rhythm.     Heart sounds: No murmur heard.  No friction rub. No gallop.   Pulmonary:     Effort: Pulmonary effort is normal. No respiratory distress.     Breath sounds: No stridor. No wheezing,  rhonchi or rales.  Chest:     Chest wall: No tenderness.  Abdominal:     General: There is no distension.     Palpations: Abdomen is soft. There is no mass.     Tenderness: There is no abdominal tenderness. There is no right CVA tenderness, left CVA tenderness, guarding or rebound.     Hernia: No hernia is present.  Musculoskeletal:     Cervical back: Neck supple.  Skin:    General: Skin is warm.     Capillary Refill: Capillary refill takes less than 2 seconds.     Coloration: Skin is not jaundiced or pale.     Findings: No rash.  Neurological:     General: No focal deficit present.     Mental Status: She is alert.     Comments: Alert and oriented x4.  GCS 15.  Cranial nerves II through XII are grossly intact.  No dysmetria with finger-to-nose bilaterally.  5-5 strength against resistance of the bilateral upper and lower extremities.  Sensation is intact and equal throughout.  No pronator drift.  Negative Romberg.  Ambulates without ataxia.  Psychiatric:        Behavior: Behavior normal.     ED Results / Procedures / Treatments   Labs (all labs ordered are listed, but only abnormal results are displayed) Labs Reviewed  BASIC METABOLIC PANEL -  Abnormal; Notable for the following components:      Result Value   Potassium 5.5 (*)    CO2 20 (*)    Glucose, Bld 100 (*)    BUN 60 (*)    Creatinine, Ser 9.10 (*)    GFR calc non Af Amer 5 (*)    GFR calc Af Amer 6 (*)    All other components within normal limits  CBC - Abnormal; Notable for the following components:   Hemoglobin 11.3 (*)    HCT 34.5 (*)    RDW 16.1 (*)    All other components within normal limits  URINALYSIS, ROUTINE W REFLEX MICROSCOPIC - Abnormal; Notable for the following components:   Hgb urine dipstick SMALL (*)    Protein, ur >=300 (*)    Leukocytes,Ua TRACE (*)    All other components within normal limits  BASIC METABOLIC PANEL - Abnormal; Notable for the following components:   CO2 17 (*)    BUN 65 (*)    Creatinine, Ser 9.40 (*)    GFR calc non Af Amer 5 (*)    GFR calc Af Amer 5 (*)    Anion gap 17 (*)    All other components within normal limits  I-STAT BETA HCG BLOOD, ED ([facility], [facility], [facility] ONLY)    EKG EKG Interpretation  Date/Time:  Wednesday August 28 2019 01:54:26 EDT Ventricular Rate:  87 PR Interval:    QRS Duration: 98 QT Interval:  395 QTC Calculation: 476 R Axis:   39 Text Interpretation: Sinus rhythm Probable left atrial enlargement When compared with ECG of 04/18/2019, T wave amplitude has increased, concerning for hyperkalemia Confirmed by Delora Fuel ([id number]) on 08/28/2019 2:00:06 AM   Radiology CT Head Wo Contrast  Result Date: 08/28/2019 CLINICAL DATA:  Frontal headache EXAM: CT HEAD WITHOUT CONTRAST TECHNIQUE: Contiguous axial images were obtained from the base of the skull through the vertex without intravenous contrast. COMPARISON:  None. FINDINGS: Brain: There is no mass, hemorrhage or extra-axial collection. The size and configuration of the ventricles and extra-axial CSF spaces are normal. The brain parenchyma is  normal, without acute or chronic infarction. Vascular: Atherosclerotic calcification of the internal carotid arteries  at the skull base. No abnormal hyperdensity of the major intracranial arteries or dural venous sinuses. Skull: The visualized skull base, calvarium and extracranial soft tissues are normal. Sinuses/Orbits: No fluid levels or advanced mucosal thickening of the visualized paranasal sinuses. No mastoid or middle ear effusion. The orbits are normal. IMPRESSION: Normal head CT. Electronically Signed   By: Ulyses Jarred M.D.   On: 08/28/2019 02:23    Procedures Procedures (including critical care time)  Medications Ordered in ED Medications  sodium chloride flush (NS) 0.9 % injection 3 mL (has no administration in time range)  prochlorperazine (COMPAZINE) injection 10 mg (10 mg Intravenous Given 08/28/19 0156)  diphenhydrAMINE (BENADRYL) injection 25 mg (25 mg Intravenous Given 08/28/19 0156)  sodium zirconium cyclosilicate (LOKELMA) packet 10 g (10 g Oral Given 08/28/19 0218)  hydrALAZINE (APRESOLINE) tablet 100 mg (100 mg Oral Given 08/28/19 0217)  labetalol (NORMODYNE) injection 20 mg (20 mg Intravenous Given 08/28/19 0217)  NIFEdipine (PROCARDIA XL/NIFEDICAL-XL) 24 hr tablet 90 mg (90 mg Oral Given 08/28/19 0217)  labetalol (NORMODYNE) tablet 300 mg (300 mg Oral Given 08/28/19 0342)  dexamethasone (DECADRON) injection 10 mg (10 mg Intravenous Given 08/28/19 0342)    ED Course  I have reviewed the triage vital signs and the nursing notes.  Pertinent labs & imaging results that were available during my care of the patient were reviewed by me and considered in my medical decision making (see chart for details).    MDM Rules/Calculators/A&P                          39 year old female with a history of ESRD on HD (M/W/F), systemic lupus erythematous and history of lupus nephritis, and uncontrolled hypertension who presents to the emergency department with a sudden onset headache that began this afternoon.  She was initially hypertensive at 160/115 on arrival, but after waiting for many hours in the  waiting room, she was hypertensive to 210/115 on my evaluation.  On exam, she has no focal neurologic deficits.  The patient was seen and independently evaluated by Dr. Roxanne Mins, attending physician.  Head CT is negative for stroke.  No other focal findings.  She will be given Compazine and Benadryl.  On reevaluation, headache has significantly improved and she is feeling much better.  Initially, potassium was 5.5 on initial labs.  EKG with peaked T waves.  Initially consulted Dr. Carolin Sicks with nephrology given concerns for changes on her EKG given concern for need for emergent dialysis.  Initial plan was to admit the patient and have her be dialyzed in the morning.  Repeat metabolic panel was pending at that time, but repeat potassium was 4.8.  She was given [facility] prior to repeat labs.  She is adamantly denying chest pain or shortness of breath.  She has been given her home antihypertensives and blood pressure is downtrending.  180/115.  Patient reports that her dialysis session is at 7 AM.  After shared decision-making conversation, she reports that with significantly improving headache that she feels that she can make her morning session.  After further discussion with Dr. Roxanne Mins, feels that the patient can be discharged to make her outpatient dialysis appointment since she has no electrolyte derangements at this time and is asymptomatic.  She will be discharged to go to her outpatient dialysis session in the morning.  All questions answered.  She reports that  she is feeling markedly improved after arrival.  Safe for discharge to dialysis center.  ER return precautions given.  Final Clinical Impression(s) / ED Diagnoses Final diagnoses:  Bad headache  Transfusion associated circulatory overload    Rx / DC Orders ED Discharge Orders    None       Roneka Gilpin A, PA-C 12/75/17 0017    Delora Fuel, MD 49/44/96 [phone]

## 2019-10-03 ENCOUNTER — Encounter (HOSPITAL_COMMUNITY): Payer: Self-pay | Admitting: Emergency Medicine

## 2019-10-03 ENCOUNTER — Other Ambulatory Visit: Payer: Self-pay

## 2019-10-03 ENCOUNTER — Emergency Department (HOSPITAL_COMMUNITY)
Admission: EM | Admit: 2019-10-03 | Discharge: 2019-10-03 | Disposition: A | Discharge status: Home / Self Care | Payer: Medicare Other | Attending: Emergency Medicine | Admitting: Emergency Medicine

## 2019-10-03 DIAGNOSIS — T448X1A Poisoning by centrally-acting and adrenergic-neuron-blocking agents, accidental (unintentional), initial encounter: Secondary | ICD-10-CM | POA: Diagnosis present

## 2019-10-03 DIAGNOSIS — Z5321 Procedure and treatment not carried out due to patient leaving prior to being seen by health care provider: Secondary | ICD-10-CM | POA: Diagnosis not present

## 2019-10-03 LAB — I-STAT BETA HCG BLOOD, ED (MC, WL, AP ONLY): I-stat hCG, quantitative: <5 m[IU]/mL (ref <5)

## 2019-10-03 LAB — CBC WITH DIFFERENTIAL/PLATELET
Abs Immature Granulocytes: 0.02 10*3/uL (ref 0.00–0.07)
Basophils Absolute: 0 10*3/uL (ref 0.0–0.1)
Basophils Relative: 0 %
Eosinophils Absolute: 0.1 10*3/uL (ref 0.0–0.5)
Eosinophils Relative: 1 %
HCT: 38.6 % (ref 36.0–46.0)
Hemoglobin: 13.3 g/dL (ref 12.0–15.0)
Immature Granulocytes: 0 %
Lymphocytes Relative: 19 %
Lymphs Abs: 1 10*3/uL (ref 0.7–4.0)
MCH: 27.8 pg (ref 26.0–34.0)
MCHC: 34.5 g/dL (ref 30.0–36.0)
MCV: 80.8 fL (ref 80.0–100.0)
Monocytes Absolute: 0.7 10*3/uL (ref 0.1–1.0)
Monocytes Relative: 12 %
Neutro Abs: 3.7 10*3/uL (ref 1.7–7.7)
Neutrophils Relative %: 68 %
Platelets: 204 10*3/uL (ref 150–400)
RBC: 4.78 MIL/uL (ref 3.87–5.11)
RDW: 15.7 % — ABNORMAL HIGH (ref 11.5–15.5)
WBC: 5.5 10*3/uL (ref 4.0–10.5)
nRBC: 0 % (ref 0.0–0.2)

## 2019-10-03 LAB — COMPREHENSIVE METABOLIC PANEL
ALT: 15 U/L (ref 0–44)
AST: 21 U/L (ref 15–41)
Albumin: 4.3 g/dL (ref 3.5–5.0)
Alkaline Phosphatase: 95 U/L (ref 38–126)
Anion gap: 16 — ABNORMAL HIGH (ref 5–15)
BUN: 32 mg/dL — ABNORMAL HIGH (ref 6–20)
CO2: 24 mmol/L (ref 22–32)
Calcium: 9.1 mg/dL (ref 8.9–10.3)
Chloride: 98 mmol/L (ref 98–111)
Creatinine, Ser: 7.43 mg/dL — ABNORMAL HIGH (ref 0.44–1.00)
GFR calc Af Amer: 7 mL/min — ABNORMAL LOW (ref >60)
GFR calc non Af Amer: 6 mL/min — ABNORMAL LOW (ref >60)
Glucose, Bld: 156 mg/dL — ABNORMAL HIGH (ref 70–99)
Potassium: 3.7 mmol/L (ref 3.5–5.1)
Sodium: 138 mmol/L (ref 135–145)
Total Bilirubin: 0.6 mg/dL (ref 0.3–1.2)
Total Protein: 7.4 g/dL (ref 6.5–8.1)

## 2019-10-03 NOTE — ED Triage Notes (Signed)
Patient arrived with EMS from home accidentally took 2 doses of Labetalol 300 mg tab , 1 tab at 5 pm and 1 tab at 8:30pm , BP= 70/40 at home , she received NS 100 ml IV by EMS prior to arrival , hemodialysis today , alert and oriented /respirations unlabored.

## 2019-10-03 NOTE — ED Notes (Signed)
Reported to have left triage and left lobby.

## 2019-10-03 NOTE — ED Notes (Signed)
Patient advised RN that she is feeling better and would like to go home and see her MD in the morning .

## 2020-09-15 DIAGNOSIS — D509 Iron deficiency anemia, unspecified: Secondary | ICD-10-CM | POA: Diagnosis present

## 2020-11-04 ENCOUNTER — Emergency Department (HOSPITAL_COMMUNITY)
Admission: EM | Admit: 2020-11-04 | Discharge: 2020-11-04 | Disposition: A | Discharge status: Home / Self Care | Payer: Medicare Other | Attending: Emergency Medicine | Admitting: Emergency Medicine

## 2020-11-04 ENCOUNTER — Emergency Department (HOSPITAL_COMMUNITY): Payer: Medicare Other

## 2020-11-04 DIAGNOSIS — R252 Cramp and spasm: Secondary | ICD-10-CM | POA: Diagnosis not present

## 2020-11-04 DIAGNOSIS — Z992 Dependence on renal dialysis: Secondary | ICD-10-CM | POA: Diagnosis not present

## 2020-11-04 DIAGNOSIS — E875 Hyperkalemia: Secondary | ICD-10-CM | POA: Insufficient documentation

## 2020-11-04 DIAGNOSIS — Z79899 Other long term (current) drug therapy: Secondary | ICD-10-CM | POA: Diagnosis not present

## 2020-11-04 DIAGNOSIS — N186 End stage renal disease: Secondary | ICD-10-CM | POA: Insufficient documentation

## 2020-11-04 DIAGNOSIS — R600 Localized edema: Secondary | ICD-10-CM | POA: Insufficient documentation

## 2020-11-04 DIAGNOSIS — Z20822 Contact with and (suspected) exposure to covid-19: Secondary | ICD-10-CM | POA: Insufficient documentation

## 2020-11-04 DIAGNOSIS — F17210 Nicotine dependence, cigarettes, uncomplicated: Secondary | ICD-10-CM | POA: Insufficient documentation

## 2020-11-04 DIAGNOSIS — I120 Hypertensive chronic kidney disease with stage 5 chronic kidney disease or end stage renal disease: Secondary | ICD-10-CM | POA: Diagnosis not present

## 2020-11-04 LAB — COMPREHENSIVE METABOLIC PANEL
ALT: 22 U/L (ref 0–44)
AST: 21 U/L (ref 15–41)
Albumin: 3.5 g/dL (ref 3.5–5.0)
Alkaline Phosphatase: 105 U/L (ref 38–126)
Anion gap: 16 — ABNORMAL HIGH (ref 5–15)
BUN: 82 mg/dL — ABNORMAL HIGH (ref 6–20)
CO2: 24 mmol/L (ref 22–32)
Calcium: 8.6 mg/dL — ABNORMAL LOW (ref 8.9–10.3)
Chloride: 101 mmol/L (ref 98–111)
Creatinine, Ser: 11.03 mg/dL — ABNORMAL HIGH (ref 0.44–1.00)
GFR, Estimated: 4 mL/min — ABNORMAL LOW (ref >60)
Glucose, Bld: 103 mg/dL — ABNORMAL HIGH (ref 70–99)
Potassium: 6.3 mmol/L (ref 3.5–5.1)
Sodium: 141 mmol/L (ref 135–145)
Total Bilirubin: 0.7 mg/dL (ref 0.3–1.2)
Total Protein: 6.5 g/dL (ref 6.5–8.1)

## 2020-11-04 LAB — CBC WITH DIFFERENTIAL/PLATELET
Abs Immature Granulocytes: 0.06 10*3/uL (ref 0.00–0.07)
Basophils Absolute: 0.1 10*3/uL (ref 0.0–0.1)
Basophils Relative: 1 %
Eosinophils Absolute: 0.3 10*3/uL (ref 0.0–0.5)
Eosinophils Relative: 4 %
HCT: 25.1 % — ABNORMAL LOW (ref 36.0–46.0)
Hemoglobin: 8.3 g/dL — ABNORMAL LOW (ref 12.0–15.0)
Immature Granulocytes: 1 %
Lymphocytes Relative: 30 %
Lymphs Abs: 2.7 10*3/uL (ref 0.7–4.0)
MCH: 26.9 pg (ref 26.0–34.0)
MCHC: 33.1 g/dL (ref 30.0–36.0)
MCV: 81.2 fL (ref 80.0–100.0)
Monocytes Absolute: 0.8 10*3/uL (ref 0.1–1.0)
Monocytes Relative: 8 %
Neutro Abs: 5.2 10*3/uL (ref 1.7–7.7)
Neutrophils Relative %: 56 %
Platelets: 188 10*3/uL (ref 150–400)
RBC: 3.09 MIL/uL — ABNORMAL LOW (ref 3.87–5.11)
RDW: 14.7 % (ref 11.5–15.5)
WBC: 9 10*3/uL (ref 4.0–10.5)
nRBC: 0 % (ref 0.0–0.2)

## 2020-11-04 LAB — POC OCCULT BLOOD, ED: Fecal Occult Bld: NEGATIVE

## 2020-11-04 LAB — RESP PANEL BY RT-PCR (FLU A&B, COVID) ARPGX2
Influenza A by PCR: NEGATIVE
Influenza B by PCR: NEGATIVE
SARS Coronavirus 2 by RT PCR: NEGATIVE

## 2020-11-04 LAB — CBG MONITORING, ED: Glucose-Capillary: 99 mg/dL (ref 70–99)

## 2020-11-04 LAB — I-STAT BETA HCG BLOOD, ED (MC, WL, AP ONLY): I-stat hCG, quantitative: <5 m[IU]/mL (ref <5)

## 2020-11-04 MED ORDER — OXYCODONE-ACETAMINOPHEN 5-325 MG PO TABS
1.0000 | ORAL_TABLET | Freq: Once | ORAL | Status: AC
Start: 2020-11-04 — End: 2020-11-04
  Administered 2020-11-04: 1 via ORAL
  Filled 2020-11-04: qty 1

## 2020-11-04 MED ORDER — INSULIN ASPART 100 UNIT/ML IV SOLN
5.0000 [IU] | Freq: Once | INTRAVENOUS | Status: AC
  Administered 2020-11-04: 5 [IU] via INTRAVENOUS

## 2020-11-04 MED ORDER — CHLORHEXIDINE GLUCONATE CLOTH 2 % EX PADS: 6.0000 | MEDICATED_PAD | Freq: Every day | CUTANEOUS | Status: DC

## 2020-11-04 MED ORDER — ACETAMINOPHEN 325 MG PO TABS
650.0000 mg | ORAL_TABLET | Freq: Once | ORAL | Status: AC
  Administered 2020-11-04: 650 mg via ORAL
  Filled 2020-11-04: qty 2

## 2020-11-04 MED ORDER — ALBUTEROL SULFATE (2.5 MG/3ML) 0.083% IN NEBU
10.0000 mg | INHALATION_SOLUTION | Freq: Once | RESPIRATORY_TRACT | Status: AC
  Administered 2020-11-04: 10 mg via RESPIRATORY_TRACT
  Filled 2020-11-04: qty 12

## 2020-11-04 MED ORDER — DEXTROSE 10 % IV SOLN: Freq: Once | INTRAVENOUS | Status: AC

## 2020-11-04 MED ORDER — DEXTROSE 50 % IV SOLN
1.0000 | Freq: Once | INTRAVENOUS | Status: AC
  Administered 2020-11-04: 50 mL via INTRAVENOUS
  Filled 2020-11-04: qty 50

## 2020-11-04 MED ORDER — SODIUM ZIRCONIUM CYCLOSILICATE 10 G PO PACK
10.0000 g | PACK | Freq: Once | ORAL | Status: AC
  Administered 2020-11-04: 10 g via ORAL
  Filled 2020-11-04: qty 1

## 2020-11-04 MED ORDER — OXYCODONE-ACETAMINOPHEN 5-325 MG PO TABS: 1.0000 | ORAL_TABLET | Freq: Once | ORAL | Status: AC | PRN

## 2020-11-04 MED ORDER — OXYCODONE-ACETAMINOPHEN 5-325 MG PO TABS
ORAL_TABLET | ORAL | Status: AC
  Administered 2020-11-04: 1 via ORAL
  Filled 2020-11-04: qty 1

## 2020-11-04 NOTE — ED Provider Notes (Signed)
Care assumed on return from Dialysis. Patient feeling better and ready to go home.  Physical Exam  BP (!) 164/98 (BP Location: Right Arm)   Pulse 86   Temp 98.4 F (36.9 C) (Oral)   Resp 20   Wt 83.7 kg   SpO2 100%   BMI 31.67 kg/m   Physical Exam Awake alert No distress  ED Course/Procedures     Procedures  MDM  Discharge home post dialysis. Will go back to regularly scheduled dialysis tomorrow.        Truddie Hidden, MD 11/04/20 2157

## 2020-11-04 NOTE — ED Notes (Signed)
HD called for update on pt status, they will be able to fit her in this afternoon.

## 2020-11-04 NOTE — ED Triage Notes (Signed)
Pt c/o restless legs x88mo. Pt states she's on dialysis pt Tues, Thurs, Sat. Pt states only completed 1hr last dialysis (Tues). Pt on lyrica for restless legs, states "not really working," follow up Oct.  10/10 "spasm, catching"

## 2020-11-04 NOTE — ED Provider Notes (Signed)
Emergency Medicine Provider Triage Evaluation Note  Latasha Drake , a 40 y.o. female  was evaluated in triage.  Pt complains of "restless leg" onset 6 mos ago.  Patient has a history of ESRD on dialysis Tuesday Thursday Saturday.  She did go to dialysis yesterday however had to stop after an hour because of the cramping of the legs.  Reports she has seen her primary care for this and was put on Lyrica without improvement.  Additionally she takes Flexeril without relief.  Reports pain has never been this bad.  No specific aggravating or alleviating factors.  She does feel as if she might have additional fluid on her due to decreased dialysis time.  Review of Systems  Positive: Myalgias, leg cramps, spasms Negative: Fever, chills  Physical Exam  BP 131/90 (BP Location: Right Arm)   Pulse (!) 101   Temp 98.3 F (36.8 C) (Oral)   Resp (!) 22   SpO2 98%  Gen:   Awake, no distress   Resp:  Normal effort  MSK:   Moves extremities without difficulty  Other:  Uncomfortable appearing, fistula in the left upper arm with palpable thrill  Medical Decision Making  Medically screening exam initiated at 4:56 AM.  Appropriate orders placed.  Wilber Oliphant was informed that the remainder of the evaluation will be completed by another provider, this initial triage assessment does not replace that evaluation, and the importance of remaining in the ED until their evaluation is complete.  Patient with restless leg and muscle spasms.  Concern for electrolyte disturbance given her history of dialysis and incomplete treatment yesterday.  Labs and imaging pending.  Patient given pain control in triage.   Govani Radloff, Gwenlyn Perking [phone]    Delora Fuel, MD [phone]

## 2020-11-04 NOTE — ED Provider Notes (Signed)
[facility] EMERGENCY DEPARTMENT Provider Note   CSN: [id number] Arrival date & time: 11/04/20  0433     History Chief Complaint  Patient presents with   Spasms    Latasha Drake is a 40 y.o. female.  The history is provided by the patient.  Illness Location:  Body spasms Severity:  Moderate Onset quality:  Gradual Duration:  6 months Timing:  Intermittent Progression:  Waxing and waning Chronicity:  New Context:  Patient here for body spasms.  These have been ongoing for a while.  Was unable to complete dialysis due to discomfort.  Feels like symptoms worsen she has fluid on her legs.  Feels better after Norco in the waiting room Relieved by:  Noting Worsened by:  Nothing Ineffective treatments:  Gabapentin Associated symptoms: no abdominal pain, no chest pain, no cough, no ear pain, no fever, no rash, no shortness of breath, no sore throat and no vomiting       Past Medical History:  Diagnosis Date   Anemia    Chronic kidney disease    Hypertension    Lupus ([place])    Pericardial effusion 04/13/2019   circumferential   Pneumonia 04/12/2019   Pulmonary hypertension ([place]) 04/13/2019   moderate    Patient Active Problem List   Diagnosis Date Noted   Pleural effusion    Acute respiratory failure with hypoxia ([place]) 04/19/2019   Chest pain 04/13/2019   ESRD (end stage renal disease) ([place]) 04/13/2019   Pneumonia 04/12/2019   Acute on chronic blood loss anemia 10/04/2018   Symptomatic anemia 10/04/2018   Lupus ([place])    Hypertension    Chronic kidney disease    Acute renal failure superimposed on chronic kidney disease (HCC)    Elevated d-dimer    Pericardial effusion    Traumatic perinephric hematoma of left kidney     Past Surgical History:  Procedure Laterality Date   CESAREAN SECTION     HERNIA REPAIR     IR FLUORO GUIDE CV LINE RIGHT  04/19/2019   IR FLUORO GUIDE CV LINE RIGHT  04/22/2019   IR US GUIDE VASC ACCESS RIGHT  04/19/2019   IR  US GUIDE VASC ACCESS RIGHT  04/22/2019     OB History   No obstetric history on file.     Family History  Problem Relation Age of Onset   Lupus Other        cousin    Social History   Tobacco Use   Smoking status: Every Day    Packs/day: 0.50    Types: Cigarettes   Smokeless tobacco: Never  Substance Use Topics   Alcohol use: Yes    Comment: occassionally   Drug use: No    Home Medications Prior to Admission medications   Medication Sig Start Date End Date Taking? Authorizing Provider  cloNIDine (CATAPRES - DOSED IN MG/24 HR) 0.1 mg/24hr patch Place 1 patch (0.1 mg total) onto the skin once a week. 04/29/19   Mikhail, Velta Addison, DO  cyclobenzaprine (FLEXERIL) 10 MG tablet Take 10 mg by mouth 3 (three) times daily as needed for muscle spasms.  04/03/19   [provider]  diclofenac Sodium (VOLTAREN) 1 % GEL Apply 4 g topically 4 (four) times daily. 04/26/19   Mikhail, Velta Addison, DO  hydrALAZINE (APRESOLINE) 100 MG tablet Take 1 tablet (100 mg total) by mouth 3 (three) times daily. 04/26/19   Mikhail, Velta Addison, DO  labetalol (NORMODYNE) 300 MG tablet Take 300 mg by mouth 2 (  two) times daily.    [provider]  NIFEdipine (PROCARDIA XL/NIFEDICAL-XL) 90 MG 24 hr tablet Take 1 tablet (90 mg total) by mouth 2 (two) times daily. 04/26/19   Mikhail, Velta Addison, DO  ondansetron (ZOFRAN) 4 MG tablet Take 1 tablet (4 mg total) by mouth every 8 (eight) hours as needed for nausea or vomiting. 05/28/15   Mackuen, Courteney Lyn, MD  oxyCODONE (OXY IR/ROXICODONE) 5 MG immediate release tablet Take 1 tablet (5 mg total) by mouth every 4 (four) hours as needed for breakthrough pain. 04/26/19   Mikhail, Velta Addison, DO  pantoprazole (PROTONIX) 40 MG tablet Take 40 mg by mouth daily as needed (indigestion).  10/27/16   [provider]  paragard intrauterine copper IUD IUD 1 Intra Uterine Device by Intrauterine route once.    [provider]  predniSONE (DELTASONE) 20 MG tablet  Take 20 mg by mouth daily.  01/23/19   [provider]  Vitamin D, Ergocalciferol, (DRISDOL) 50000 units CAPS capsule Take 50,000 Units by mouth every Sunday. Take on Sun. 06/23/15   [provider]    Allergies    Lisinopril  Review of Systems   Review of Systems  Constitutional:  Negative for chills and fever.  HENT:  Negative for ear pain and sore throat.   Eyes:  Negative for pain and visual disturbance.  Respiratory:  Negative for cough and shortness of breath.   Cardiovascular:  Positive for leg swelling. Negative for chest pain and palpitations.  Gastrointestinal:  Negative for abdominal pain and vomiting.  Genitourinary:  Negative for dysuria and hematuria.  Musculoskeletal:  Negative for arthralgias and back pain.       Spasms   Skin:  Negative for color change and rash.  Neurological:  Negative for seizures and syncope.  All other systems reviewed and are negative.  Physical Exam Updated Vital Signs BP (!) 150/92 (BP Location: Right Wrist)   Pulse 86   Temp 98.3 F (36.8 C) (Oral)   Resp 19   SpO2 97%   Physical Exam Vitals and nursing note reviewed.  Constitutional:      General: She is not in acute distress.    Appearance: She is well-developed. She is not ill-appearing.  HENT:     Head: Normocephalic and atraumatic.     Nose: Nose normal.     Mouth/Throat:     Mouth: Mucous membranes are moist.  Eyes:     Extraocular Movements: Extraocular movements intact.     Conjunctiva/sclera: Conjunctivae normal.     Pupils: Pupils are equal, round, and reactive to light.  Cardiovascular:     Rate and Rhythm: Normal rate and regular rhythm.     Heart sounds: No murmur heard. Pulmonary:     Effort: Pulmonary effort is normal. No respiratory distress.     Breath sounds: Normal breath sounds.  Abdominal:     General: Abdomen is flat.     Palpations: Abdomen is soft.     Tenderness: There is no abdominal tenderness.  Musculoskeletal:     Cervical  back: Neck supple.     Right lower leg: Edema present.     Left lower leg: Edema present.  Skin:    General: Skin is warm and dry.     Capillary Refill: Capillary refill takes less than 2 seconds.  Neurological:     General: No focal deficit present.     Mental Status: She is alert.  Psychiatric:        Mood and  Affect: Mood normal.    ED Results / Procedures / Treatments   Labs (all labs ordered are listed, but only abnormal results are displayed) Labs Reviewed  CBC WITH DIFFERENTIAL/PLATELET - Abnormal; Notable for the following components:      Result Value   RBC 3.09 (*)    Hemoglobin 8.3 (*)    HCT 25.1 (*)    All other components within normal limits  COMPREHENSIVE METABOLIC PANEL - Abnormal; Notable for the following components:   Potassium 6.3 (*)    Glucose, Bld 103 (*)    BUN 82 (*)    Creatinine, Ser 11.03 (*)    Calcium 8.6 (*)    GFR, Estimated 4 (*)    Anion gap 16 (*)    All other components within normal limits  RESP PANEL BY RT-PCR (FLU A&B, COVID) ARPGX2  GLUCOSE, RANDOM  I-STAT BETA HCG BLOOD, ED ([facility], [facility], [facility] ONLY)  POC OCCULT BLOOD, ED  CBG MONITORING, ED    EKG EKG Interpretation  Date/Time:  Wednesday November 04 2020 07:08:42 EDT Ventricular Rate:  89 PR Interval:  148 QRS Duration: 90 QT Interval:  359 QTC Calculation: 437 R Axis:   36 Text Interpretation: Sinus rhythm Consider anterior infarct Confirmed by Lennice Sites ([id number]) on 11/04/2020 7:19:32 AM  Radiology DG Chest 2 View  Result Date: 11/04/2020 CLINICAL DATA:  Dialysis patient with cramps EXAM: CHEST - 2 VIEW COMPARISON:  04/24/2019 FINDINGS: Vascular congestion with cephalized blood flow. Elevated right diaphragm with over lying streaky opacity which has fluctuated on prior radiographs, increased from most recent comparison. No visible effusion or pneumothorax. Cardiomegaly. Coarse calcification the left base of chest. IMPRESSION: 1. Cardiomegaly and vascular congestion. 2. Chronic  elevation of the right diaphragm with lower lobe atelectasis or infiltrate. Electronically Signed   By: Monte Fantasia M.D.   On: 11/04/2020 05:35    Procedures .Critical Care  Date/Time: 11/04/2020 9:09 AM Performed by: Lennice Sites, DO Authorized by: Lennice Sites, DO   Critical care provider statement:    Critical care time (minutes):  35   Critical care was necessary to treat or prevent imminent or life-threatening deterioration of the following conditions: hyperkalemia.   Critical care was time spent personally by me on the following activities:  Blood draw for specimens, development of treatment plan with patient or surrogate, discussions with consultants, obtaining history from patient or surrogate, ordering and performing treatments and interventions, ordering and review of laboratory studies, ordering and review of radiographic studies, pulse oximetry, re-evaluation of patient's condition and review of old charts   Care discussed with comment:  To dialysis with nephrology team   Medications Ordered in ED Medications  Chlorhexidine Gluconate Cloth 2 % PADS 6 each (has no administration in time range)  oxyCODONE-acetaminophen (PERCOCET/ROXICET) 5-325 MG per tablet 1 tablet (1 tablet Oral Given 11/04/20 0457)  sodium zirconium cyclosilicate (LOKELMA) packet 10 g (10 g Oral Given 11/04/20 0749)  albuterol (PROVENTIL) (2.5 MG/3ML) 0.083% nebulizer solution 10 mg (10 mg Nebulization Given 11/04/20 0750)  insulin aspart (novoLOG) injection 5 Units (5 Units Intravenous Given 11/04/20 0758)    And  dextrose 50 % solution 50 mL (50 mLs Intravenous Given 11/04/20 0746)  dextrose 10 % infusion ( Intravenous New Bag/Given 11/04/20 [id number])    ED Course  I have reviewed the triage vital signs and the nursing notes.  Pertinent labs & imaging results that were available during my care of the patient were reviewed by me and considered in my  medical decision making (see chart for details).    MDM  Rules/Calculators/A&P                           Wilber Oliphant is here for spasms throughout her body but worse in her legs.  History of end-stage renal disease on hemodialysis.  Was unable to finish dialysis yesterday due to spasms.  She has been on gabapentin but now on Lyrica for this.  Ongoing for the last several months.  Got a Percocet in the waiting room and feels improved but now sleepy.  She denies any chest pain or shortness of breath but she says increased swelling in her legs which she thinks may make her symptoms worse.  Neurologically she appears intact.  Screening labs ordered in the waiting room and were significant for potassium of 6.3.  EKG with no obvious hyperkalemic changes.  Creatinine above baseline.  Hemoglobin is 8.3 but no melena or hematochezia on rectal exam.  Patient given Lokelma, albuterol, insulin and dextrose for hyperkalemia.  I have talked with nephrology about dialysis plan given her hyperkalemia.  Lab work is otherwise unremarkable.  Overall suspect that this is a chronic issue causing her cramping feeling in her legs and body.  Believe that this will improve with complete dialysis.  Patient will get dialysis inpatient and then likely brought back to the ED.  Suspect that she will be fine for discharge after this.  Continue management of spasms and restless leg syndromes with primary care doctor.  Hemodynamically stable throughout my care.  This chart was dictated using voice recognition software.  Despite best efforts to proofread,  errors can occur which can change the documentation meaning.   Final Clinical Impression(s) / ED Diagnoses Final diagnoses:  Hyperkalemia    Rx / DC Orders ED Discharge Orders     None        Lennice Sites, DO 11/04/20 [id number]

## 2020-12-05 ENCOUNTER — Inpatient Hospital Stay (HOSPITAL_COMMUNITY)
Admission: EM | Admit: 2020-12-05 | Discharge: 2020-12-07 | DRG: 193 | Disposition: A | Discharge status: Home / Self Care | Payer: Medicare Other | Attending: Internal Medicine | Admitting: Internal Medicine

## 2020-12-05 ENCOUNTER — Emergency Department (HOSPITAL_COMMUNITY): Payer: Medicare Other

## 2020-12-05 DIAGNOSIS — I272 Pulmonary hypertension, unspecified: Secondary | ICD-10-CM | POA: Diagnosis present

## 2020-12-05 DIAGNOSIS — R0902 Hypoxemia: Secondary | ICD-10-CM

## 2020-12-05 DIAGNOSIS — M329 Systemic lupus erythematosus, unspecified: Secondary | ICD-10-CM | POA: Diagnosis present

## 2020-12-05 DIAGNOSIS — Z20822 Contact with and (suspected) exposure to covid-19: Secondary | ICD-10-CM | POA: Diagnosis present

## 2020-12-05 DIAGNOSIS — M545 Low back pain, unspecified: Secondary | ICD-10-CM | POA: Diagnosis present

## 2020-12-05 DIAGNOSIS — J9601 Acute respiratory failure with hypoxia: Secondary | ICD-10-CM | POA: Diagnosis present

## 2020-12-05 DIAGNOSIS — R509 Fever, unspecified: Secondary | ICD-10-CM | POA: Diagnosis not present

## 2020-12-05 DIAGNOSIS — D631 Anemia in chronic kidney disease: Secondary | ICD-10-CM | POA: Diagnosis present

## 2020-12-05 DIAGNOSIS — M3214 Glomerular disease in systemic lupus erythematosus: Secondary | ICD-10-CM | POA: Diagnosis present

## 2020-12-05 DIAGNOSIS — F419 Anxiety disorder, unspecified: Secondary | ICD-10-CM | POA: Diagnosis present

## 2020-12-05 DIAGNOSIS — D509 Iron deficiency anemia, unspecified: Secondary | ICD-10-CM | POA: Diagnosis present

## 2020-12-05 DIAGNOSIS — J189 Pneumonia, unspecified organism: Secondary | ICD-10-CM | POA: Diagnosis not present

## 2020-12-05 DIAGNOSIS — G8929 Other chronic pain: Secondary | ICD-10-CM | POA: Diagnosis present

## 2020-12-05 DIAGNOSIS — F17210 Nicotine dependence, cigarettes, uncomplicated: Secondary | ICD-10-CM | POA: Diagnosis present

## 2020-12-05 DIAGNOSIS — Z28310 Unvaccinated for covid-19: Secondary | ICD-10-CM

## 2020-12-05 DIAGNOSIS — J810 Acute pulmonary edema: Principal | ICD-10-CM

## 2020-12-05 DIAGNOSIS — F32A Depression, unspecified: Secondary | ICD-10-CM | POA: Diagnosis present

## 2020-12-05 DIAGNOSIS — Z79899 Other long term (current) drug therapy: Secondary | ICD-10-CM

## 2020-12-05 DIAGNOSIS — I120 Hypertensive chronic kidney disease with stage 5 chronic kidney disease or end stage renal disease: Secondary | ICD-10-CM | POA: Diagnosis present

## 2020-12-05 DIAGNOSIS — R0602 Shortness of breath: Secondary | ICD-10-CM | POA: Diagnosis not present

## 2020-12-05 DIAGNOSIS — E8770 Fluid overload, unspecified: Secondary | ICD-10-CM | POA: Diagnosis present

## 2020-12-05 DIAGNOSIS — Z9115 Patient's noncompliance with renal dialysis: Secondary | ICD-10-CM

## 2020-12-05 DIAGNOSIS — Z888 Allergy status to other drugs, medicaments and biological substances status: Secondary | ICD-10-CM

## 2020-12-05 DIAGNOSIS — R000 Tachycardia, unspecified: Secondary | ICD-10-CM | POA: Diagnosis not present

## 2020-12-05 DIAGNOSIS — N186 End stage renal disease: Secondary | ICD-10-CM | POA: Diagnosis present

## 2020-12-05 DIAGNOSIS — Z7682 Awaiting organ transplant status: Secondary | ICD-10-CM

## 2020-12-05 DIAGNOSIS — G2581 Restless legs syndrome: Secondary | ICD-10-CM | POA: Diagnosis present

## 2020-12-05 DIAGNOSIS — E875 Hyperkalemia: Secondary | ICD-10-CM | POA: Diagnosis present

## 2020-12-05 DIAGNOSIS — Z992 Dependence on renal dialysis: Secondary | ICD-10-CM

## 2020-12-05 LAB — COMPREHENSIVE METABOLIC PANEL
ALT: 24 U/L (ref 0–44)
AST: 26 U/L (ref 15–41)
Albumin: 3.5 g/dL (ref 3.5–5.0)
Alkaline Phosphatase: 97 U/L (ref 38–126)
Anion gap: 19 — ABNORMAL HIGH (ref 5–15)
BUN: 84 mg/dL — ABNORMAL HIGH (ref 6–20)
CO2: 17 mmol/L — ABNORMAL LOW (ref 22–32)
Calcium: 8 mg/dL — ABNORMAL LOW (ref 8.9–10.3)
Chloride: 102 mmol/L (ref 98–111)
Creatinine, Ser: 12.2 mg/dL — ABNORMAL HIGH (ref 0.44–1.00)
GFR, Estimated: 4 mL/min — ABNORMAL LOW (ref >60)
Glucose, Bld: 112 mg/dL — ABNORMAL HIGH (ref 70–99)
Potassium: 6.2 mmol/L — ABNORMAL HIGH (ref 3.5–5.1)
Sodium: 138 mmol/L (ref 135–145)
Total Bilirubin: 2.1 mg/dL — ABNORMAL HIGH (ref 0.3–1.2)
Total Protein: 6.9 g/dL (ref 6.5–8.1)

## 2020-12-05 LAB — BASIC METABOLIC PANEL
Anion gap: 17 — ABNORMAL HIGH (ref 5–15)
BUN: 30 mg/dL — ABNORMAL HIGH (ref 6–20)
CO2: 26 mmol/L (ref 22–32)
Calcium: 8.3 mg/dL — ABNORMAL LOW (ref 8.9–10.3)
Chloride: 94 mmol/L — ABNORMAL LOW (ref 98–111)
Creatinine, Ser: 6.43 mg/dL — ABNORMAL HIGH (ref 0.44–1.00)
GFR, Estimated: 8 mL/min — ABNORMAL LOW (ref >60)
Glucose, Bld: 188 mg/dL — ABNORMAL HIGH (ref 70–99)
Potassium: 4.2 mmol/L (ref 3.5–5.1)
Sodium: 137 mmol/L (ref 135–145)

## 2020-12-05 LAB — CBC WITH DIFFERENTIAL/PLATELET
Abs Immature Granulocytes: 0.06 10*3/uL (ref 0.00–0.07)
Basophils Absolute: 0 10*3/uL (ref 0.0–0.1)
Basophils Relative: 0 %
Eosinophils Absolute: 0.1 10*3/uL (ref 0.0–0.5)
Eosinophils Relative: 1 %
HCT: 21.7 % — ABNORMAL LOW (ref 36.0–46.0)
Hemoglobin: 7.4 g/dL — ABNORMAL LOW (ref 12.0–15.0)
Immature Granulocytes: 1 %
Lymphocytes Relative: 14 %
Lymphs Abs: 1.5 10*3/uL (ref 0.7–4.0)
MCH: 27.1 pg (ref 26.0–34.0)
MCHC: 34.1 g/dL (ref 30.0–36.0)
MCV: 79.5 fL — ABNORMAL LOW (ref 80.0–100.0)
Monocytes Absolute: 0.5 10*3/uL (ref 0.1–1.0)
Monocytes Relative: 5 %
Neutro Abs: 8.5 10*3/uL — ABNORMAL HIGH (ref 1.7–7.7)
Neutrophils Relative %: 79 %
Platelets: 205 10*3/uL (ref 150–400)
RBC: 2.73 MIL/uL — ABNORMAL LOW (ref 3.87–5.11)
RDW: 14.8 % (ref 11.5–15.5)
WBC: 10.7 10*3/uL — ABNORMAL HIGH (ref 4.0–10.5)
nRBC: 0 % (ref 0.0–0.2)

## 2020-12-05 LAB — RESP PANEL BY RT-PCR (FLU A&B, COVID) ARPGX2
Influenza A by PCR: NEGATIVE
Influenza B by PCR: NEGATIVE
SARS Coronavirus 2 by RT PCR: NEGATIVE

## 2020-12-05 LAB — I-STAT CHEM 8, ED
BUN: 89 mg/dL — ABNORMAL HIGH (ref 6–20)
Calcium, Ion: 0.97 mmol/L — ABNORMAL LOW (ref 1.15–1.40)
Chloride: 107 mmol/L (ref 98–111)
Creatinine, Ser: 13.3 mg/dL — ABNORMAL HIGH (ref 0.44–1.00)
Glucose, Bld: 107 mg/dL — ABNORMAL HIGH (ref 70–99)
HCT: 23 % — ABNORMAL LOW (ref 36.0–46.0)
Hemoglobin: 7.8 g/dL — ABNORMAL LOW (ref 12.0–15.0)
Potassium: 6.1 mmol/L — ABNORMAL HIGH (ref 3.5–5.1)
Sodium: 140 mmol/L (ref 135–145)
TCO2: 22 mmol/L (ref 22–32)

## 2020-12-05 LAB — PROCALCITONIN: Procalcitonin: 1.2 ng/mL

## 2020-12-05 LAB — TROPONIN I (HIGH SENSITIVITY)
Troponin I (High Sensitivity): 10 ng/L (ref <18)
Troponin I (High Sensitivity): 13 ng/L (ref <18)

## 2020-12-05 LAB — BRAIN NATRIURETIC PEPTIDE: B Natriuretic Peptide: 825.5 pg/mL — ABNORMAL HIGH (ref 0.0–100.0)

## 2020-12-05 LAB — HEPATITIS B SURFACE ANTIGEN: Hepatitis B Surface Ag: NONREACTIVE

## 2020-12-05 LAB — LIPASE, BLOOD: Lipase: 67 U/L — ABNORMAL HIGH (ref 11–51)

## 2020-12-05 LAB — MRSA NEXT GEN BY PCR, NASAL: MRSA by PCR Next Gen: NOT DETECTED

## 2020-12-05 MED ORDER — ALTEPLASE 2 MG IJ SOLR
2.0000 mg | Freq: Once | INTRAMUSCULAR | Status: DC | PRN
  Filled 2020-12-05: qty 2

## 2020-12-05 MED ORDER — SODIUM CHLORIDE 0.9 % IV SOLN: 100.0000 mL | INTRAVENOUS | Status: DC | PRN

## 2020-12-05 MED ORDER — INSULIN ASPART 100 UNIT/ML IV SOLN
5.0000 [IU] | Freq: Once | INTRAVENOUS | Status: AC
  Administered 2020-12-05: 5 [IU] via INTRAVENOUS

## 2020-12-05 MED ORDER — CIPROFLOXACIN-HYDROCORTISONE 0.2-1 % OT SUSP: 4.0000 [drp] | OTIC | Status: DC

## 2020-12-05 MED ORDER — HEPARIN SODIUM (PORCINE) 5000 UNIT/ML IJ SOLN
5000.0000 [IU] | Freq: Three times a day (TID) | INTRAMUSCULAR | Status: DC
  Administered 2020-12-05 – 2020-12-07 (×6): 5000 [IU] via SUBCUTANEOUS
  Filled 2020-12-05 (×6): qty 1

## 2020-12-05 MED ORDER — DEXTROSE 50 % IV SOLN
1.0000 | Freq: Once | INTRAVENOUS | Status: AC
  Administered 2020-12-05: 50 mL via INTRAVENOUS
  Filled 2020-12-05: qty 50

## 2020-12-05 MED ORDER — IPRATROPIUM-ALBUTEROL 0.5-2.5 (3) MG/3ML IN SOLN: 3.0000 mL | Freq: Four times a day (QID) | RESPIRATORY_TRACT | Status: DC | PRN

## 2020-12-05 MED ORDER — SODIUM ZIRCONIUM CYCLOSILICATE 10 G PO PACK
10.0000 g | PACK | Freq: Once | ORAL | Status: AC
  Administered 2020-12-05: 10 g via ORAL
  Filled 2020-12-05: qty 1

## 2020-12-05 MED ORDER — PANTOPRAZOLE SODIUM 40 MG PO TBEC: 40.0000 mg | DELAYED_RELEASE_TABLET | Freq: Every day | ORAL | Status: DC | PRN

## 2020-12-05 MED ORDER — LABETALOL HCL 200 MG PO TABS
300.0000 mg | ORAL_TABLET | Freq: Two times a day (BID) | ORAL | Status: DC
  Administered 2020-12-05 – 2020-12-07 (×4): 300 mg via ORAL
  Filled 2020-12-05 (×6): qty 1

## 2020-12-05 MED ORDER — HYDROMORPHONE HCL 1 MG/ML IJ SOLN
0.5000 mg | Freq: Once | INTRAMUSCULAR | Status: AC
  Administered 2020-12-05: 0.5 mg via INTRAVENOUS
  Filled 2020-12-05: qty 0.5

## 2020-12-05 MED ORDER — SEVELAMER CARBONATE 800 MG PO TABS
1600.0000 mg | ORAL_TABLET | Freq: Three times a day (TID) | ORAL | Status: DC
  Administered 2020-12-05 – 2020-12-07 (×6): 1600 mg via ORAL
  Filled 2020-12-05 (×6): qty 2

## 2020-12-05 MED ORDER — CHLORHEXIDINE GLUCONATE CLOTH 2 % EX PADS
6.0000 | MEDICATED_PAD | Freq: Every day | CUTANEOUS | Status: DC
  Administered 2020-12-06 – 2020-12-07 (×3): 6 via TOPICAL

## 2020-12-05 MED ORDER — HYDROMORPHONE HCL 1 MG/ML IJ SOLN
0.5000 mg | Freq: Once | INTRAMUSCULAR | Status: AC
  Administered 2020-12-05: 0.5 mg via INTRAVENOUS
  Filled 2020-12-05: qty 1

## 2020-12-05 MED ORDER — ROPINIROLE HCL 1 MG PO TABS
2.0000 mg | ORAL_TABLET | Freq: Every day | ORAL | Status: DC
  Administered 2020-12-05 – 2020-12-06 (×2): 2 mg via ORAL
  Filled 2020-12-05 (×3): qty 2

## 2020-12-05 MED ORDER — HYDRALAZINE HCL 50 MG PO TABS
100.0000 mg | ORAL_TABLET | Freq: Three times a day (TID) | ORAL | Status: DC
  Administered 2020-12-05 – 2020-12-07 (×6): 100 mg via ORAL
  Filled 2020-12-05 (×6): qty 2

## 2020-12-05 MED ORDER — PREGABALIN 75 MG PO CAPS
150.0000 mg | ORAL_CAPSULE | Freq: Two times a day (BID) | ORAL | Status: DC
  Administered 2020-12-05 – 2020-12-07 (×4): 150 mg via ORAL
  Filled 2020-12-05 (×4): qty 2

## 2020-12-05 MED ORDER — ACETAMINOPHEN 325 MG PO TABS
650.0000 mg | ORAL_TABLET | Freq: Four times a day (QID) | ORAL | Status: DC | PRN
  Administered 2020-12-05 – 2020-12-06 (×3): 650 mg via ORAL
  Filled 2020-12-05 (×3): qty 2

## 2020-12-05 MED ORDER — CITALOPRAM HYDROBROMIDE 20 MG PO TABS
20.0000 mg | ORAL_TABLET | Freq: Every day | ORAL | Status: DC
  Administered 2020-12-05 – 2020-12-07 (×3): 20 mg via ORAL
  Filled 2020-12-05 (×3): qty 1

## 2020-12-05 MED ORDER — HEPARIN SODIUM (PORCINE) 1000 UNIT/ML DIALYSIS
40.0000 [IU]/kg | INTRAMUSCULAR | Status: DC | PRN
  Filled 2020-12-05: qty 4

## 2020-12-05 MED ORDER — NITROGLYCERIN 0.4 MG SL SUBL
0.4000 mg | SUBLINGUAL_TABLET | SUBLINGUAL | Status: DC | PRN
  Administered 2020-12-05: 0.4 mg via SUBLINGUAL
  Filled 2020-12-05: qty 1

## 2020-12-05 MED ORDER — LIDOCAINE-PRILOCAINE 2.5-2.5 % EX CREA
1.0000 "application " | TOPICAL_CREAM | CUTANEOUS | Status: DC | PRN
  Filled 2020-12-05: qty 5

## 2020-12-05 MED ORDER — PENTAFLUOROPROP-TETRAFLUOROETH EX AERO
1.0000 "application " | INHALATION_SPRAY | CUTANEOUS | Status: DC | PRN
  Filled 2020-12-05: qty 116

## 2020-12-05 MED ORDER — AMLODIPINE BESYLATE 10 MG PO TABS
10.0000 mg | ORAL_TABLET | Freq: Every day | ORAL | Status: DC
  Administered 2020-12-05 – 2020-12-07 (×3): 10 mg via ORAL
  Filled 2020-12-05 (×3): qty 1

## 2020-12-05 MED ORDER — METHOCARBAMOL 500 MG PO TABS
750.0000 mg | ORAL_TABLET | Freq: Three times a day (TID) | ORAL | Status: DC
  Administered 2020-12-05 – 2020-12-07 (×6): 750 mg via ORAL
  Filled 2020-12-05 (×6): qty 2

## 2020-12-05 MED ORDER — HEPARIN SODIUM (PORCINE) 1000 UNIT/ML DIALYSIS
1000.0000 [IU] | INTRAMUSCULAR | Status: DC | PRN
  Filled 2020-12-05: qty 1

## 2020-12-05 MED ORDER — LIDOCAINE HCL (PF) 1 % IJ SOLN: 5.0000 mL | INTRAMUSCULAR | Status: DC | PRN

## 2020-12-05 MED ORDER — DIAZEPAM 2 MG PO TABS
2.0000 mg | ORAL_TABLET | Freq: Once | ORAL | Status: AC
  Administered 2020-12-05: 2 mg via ORAL
  Filled 2020-12-05: qty 1

## 2020-12-05 MED ORDER — IPRATROPIUM-ALBUTEROL 0.5-2.5 (3) MG/3ML IN SOLN
3.0000 mL | RESPIRATORY_TRACT | Status: AC
  Administered 2020-12-05 (×3): 3 mL via RESPIRATORY_TRACT
  Filled 2020-12-05: qty 3
  Filled 2020-12-05: qty 6
  Filled 2020-12-05: qty 3

## 2020-12-05 MED ORDER — LABETALOL HCL 5 MG/ML IV SOLN
10.0000 mg | Freq: Once | INTRAVENOUS | Status: AC
  Administered 2020-12-05: 10 mg via INTRAVENOUS
  Filled 2020-12-05: qty 4

## 2020-12-05 MED ORDER — CLONIDINE HCL 0.1 MG PO TABS
0.1000 mg | ORAL_TABLET | Freq: Three times a day (TID) | ORAL | Status: DC
  Administered 2020-12-05 – 2020-12-07 (×6): 0.1 mg via ORAL
  Filled 2020-12-05 (×6): qty 1

## 2020-12-05 NOTE — ED Provider Notes (Signed)
  Physical Exam  BP (!) 179/110   Pulse (!) 103   Temp 98.9 F (37.2 C) (Oral)   Resp (!) 24   Ht '5\' 4"'$  (1.626 m)   Wt 86.2 kg   SpO2 100%   BMI 32.61 kg/m   Physical Exam  ED Course/Procedures     Procedures  MDM    Received care of patient from Dr. Tyrone Nine at 7 AM.  Please see his note for prior history, physical and care.  Briefly this is a 40 year old female with a history of ESRD on dialysis Tuesday Thursday Saturday who missed dialysis on Thursday and is presenting with shortness of breath.  She has not had fever, low suspicion for pneumonia.  Suspect symptoms are secondary to volume overload in the setting of missing dialysis.  She had been placed on BiPAP for for worsening shortness of breath, however was able to tolerate a trial on nonrebreather.  She has been temporized with insulin and dextrose for her hyperkalemia.  Again discussed patient with Dr. Posey Pronto of nephrology--plan is for patient to go to dialysis on nonrebreather.  We will reevaluate her clinical status following dialysis to determine need for admission.     Gareth Morgan, MD 12/05/20 [phone]

## 2020-12-05 NOTE — H&P (Addendum)
Date: 12/05/2020               Patient Name:  Latasha Drake MRN: [id number]  DOB: [birth year] Age / Sex: 40 y.o., female   PCP: Katherina Mires, MD         Medical Service: Internal Medicine Teaching Service         Attending Physician: Dr. Jimmye Norman, Elaina Pattee, MD    First Contact: Dr. Lorin Glass Pager: [phone]  Second Contact: Dr. Lisabeth Devoid Pager: [phone]       After Hours (After 5p/  First Contact Pager: [phone]  weekends / holidays): Second Contact Pager: [phone]   Chief Complaint: Shortness of breath  History of Present Illness: Latasha Drake is a 40 y.o. female with a history of hypertension, lupus, anemia, and ESRD on HD T/Th/Sat who presents to the ED today with complaints of shortness of breath. The patient states that this has happened to her in the past, specifically 6 months ago, when she missed an HD session. Most recently, she states that she missed her HD on September 20 and September 29. She noticed her shortness of breath 5 days ago when she was running around planning for a friend's kids birthday party. She describes it as a chest tightness/pain that radiated to her back.  When she missed her last HD session on Thursday, her shortness of breath and chest tightness worsened.  Patient also endorses a dry cough since about 3 days ago.  Her chest hurts when she coughs, however it is not pleuritic in nature.  Also had episode of diffuse abdominal pain yesterday, this is since resolved. Reports that she had some pretty bad leg swelling, which has improved after her HD session this morning. She denies fevers, chills, weakness, numbness, lightheadedness, dizziness. Gets HD at [facility].  Meds:  Current Meds  Medication Sig   amLODipine (NORVASC) 10 MG tablet Take 10 mg by mouth daily.   citalopram (CELEXA) 20 MG tablet Take 20 mg by mouth daily.   cloNIDine (CATAPRES) 0.1 MG tablet Take 0.1 mg by mouth 3 (three) times daily.   cyclobenzaprine (FLEXERIL) 10 MG tablet Take 10 mg  by mouth 3 (three) times daily as needed for muscle spasms.    diclofenac Sodium (VOLTAREN) 1 % GEL Apply 4 g topically 4 (four) times daily. (Patient taking differently: Apply 4 g topically 4 (four) times daily as needed (pain).)   hydrALAZINE (APRESOLINE) 100 MG tablet Take 1 tablet (100 mg total) by mouth 3 (three) times daily.   labetalol (NORMODYNE) 300 MG tablet Take 300 mg by mouth 2 (two) times daily.   methocarbamol (ROBAXIN) 750 MG tablet Take 750 mg by mouth 3 (three) times daily.   pantoprazole (PROTONIX) 40 MG tablet Take 40 mg by mouth daily as needed (indigestion).    paragard intrauterine copper IUD IUD 1 Intra Uterine Device by Intrauterine route once.   pregabalin (LYRICA) 150 MG capsule Take 150 mg by mouth 2 (two) times daily.   rOPINIRole (REQUIP) 2 MG tablet Take 2 mg by mouth at bedtime.   sevelamer carbonate (RENVELA) 800 MG tablet Take 1,600 mg by mouth 3 (three) times daily with meals.     Allergies: Allergies as of 12/05/2020 - Review Complete 12/05/2020  Allergen Reaction Noted   Baclofen Other (See Comments) 09/16/2020   Lisinopril Swelling 01/05/2017   Past Medical History:  Diagnosis Date   Anemia    Chronic kidney disease    Hypertension    Lupus (  [place])    Pericardial effusion 04/13/2019   circumferential   Pneumonia 04/12/2019   Pulmonary hypertension ([place]) 04/13/2019   moderate    Family History: No family history of cancers, diabetes, heart issues  Social History: Lives in a house with her boyfriend and child.  Endorses extensive history of tobacco use, states that she started age 31, was smoking half a pack a day until about 2 months ago when she increased to 1 pack/day. Denies alcohol or illicit drug use.  Review of Systems: A complete ROS was negative except as per HPI.   Physical Exam: Blood pressure (!) 148/98, pulse (!) 101, temperature 98.9 F (37.2 C), temperature source Oral, resp. rate 15, height '5\' 4"'$  (1.626 m), weight 82.4 kg,  SpO2 93 %. Physical Exam Constitutional:      General: She is not in acute distress. HENT:     Head: Normocephalic and atraumatic.  Eyes:     Pupils: Pupils are equal, round, and reactive to light.  Neck:     Vascular: Hepatojugular reflux and JVD present.     Comments: At about 60 degrees, JVD to mid neck Cardiovascular:     Rate and Rhythm: Regular rhythm. Tachycardia present.     Pulses: Normal pulses.     Heart sounds: No murmur heard.   No friction rub. No gallop.  Pulmonary:     Effort: Pulmonary effort is normal.     Comments: Diffuse end-expiratory wheezing. No crackles appreciated Abdominal:     General: Abdomen is flat. Bowel sounds are normal.     Comments: Mildly distended, no TTP in all four quadrants  Musculoskeletal:        General: No tenderness, deformity or signs of injury.     Comments: Trace bilateral pitting edema  Skin:    General: Skin is warm and dry.  Neurological:     General: No focal deficit present.     Mental Status: She is alert and oriented to person, place, and time. Mental status is at baseline.  Psychiatric:        Mood and Affect: Mood normal.        Behavior: Behavior normal.    EKG: personally reviewed my interpretation is sinus tachycardia, otherwise unchanged from prior 1 month ago  CXR: personally reviewed my interpretation is moderate to severe right suprahilar, left perihilar, and left basilar infiltrates  Assessment & Plan by Problem: Active Problems:   Acute hypoxemic respiratory failure (HCC)  #Acute hypoxic respiratory failure #ESRD on HD T/Th/Sat at [facility]  Patient endorses new onset SOB in the setting of 2 missed HD sessions over the past 2 weeks.  She endorses a similar episode 6 months ago after missing an HD session.  She describes the sensation as a chest tightness/pain radiating to her back.  In the ED, she was placed on BiPAP due to being increasingly tachypneic and tripoding.  Patient is afebrile, HR ranging  from 104-114, BP ranging from 140s-160s/70s-90s. On exam, she has hepatojugular reflux and trace edema though patient said this has improved since her HD session this morning, also with some scattered end-expiratory wheezes. WBC mildly elevated at 10.7. CMP this AM showed hyperkalemia to 6.3, was given lokelma and also temporized with insulin and dextrose. BNP 825.5.  Negative respiratory panel.  EKG with peaked T-waves but no other ST changes. CXR shows scattered infiltrates in the right suprahilar, left perihilar, and left basilar regions.  Likely volume overloaded in the setting of missing 2 recent  HD sessions.  Other items on the differential include pneumonia as she does have infiltrates on x-ray however she is afebrile and her WBC is only mildly elevated.  PE is also on the differential but less likely explanation for her SOB. Pancreatitis possible given description of pain radiating to the back though less likely. Otherwise, will obtain trop #2 for ACS rule out. -Nephrology on board, appreciate recommendations, will reach out to Dr. Posey Pronto in the AM -Trend BMP and CBC -Duonebs PRN -Nitroglycerin PRN -Troponin #2 pending -Lipase pending -Procalcitonin pending  #Lupus Diagnosed in 2000. Was on steroids in the past, per review of records may have been on plaquenil in the past though unclear after reviewing records. She is not currently on any medications for her lupus. -Continue to monitor  #HTN On clonidine 0.1 mg po TID, amlodipine 10 mg po qd, hydralazine 100 mg po TID, labetalol 300 mg po BID. BP ranging from 140s-160s/70s-90s since arrival to the ED. -Resumed home medications as above  #Resting leg syndrome #Anxiety #Depression #Chronic lower back pain -Continue ropinirole 2 mg p.o. daily -Continue citalopram 20 mg p.o. daily -Continue robaxin 750 mg PO TID -Continue pregabalin 150 mg po BID  #Anemia Pt has a history of IDA and anemia of chronic disease. Hgb of 7.4 today. -Trend  CBC  Dispo: Admit patient to Observation with expected length of stay less than 2 midnights.  Signed: Orvis Brill, MD 12/05/2020, 4:12 PM  Pager: [phone] After 5pm on weekdays and 1pm on weekends: On Call pager: [phone]

## 2020-12-05 NOTE — ED Notes (Signed)
Patient A/O, remains on BIPAP per RT. Denies pain/discomfort.

## 2020-12-05 NOTE — Progress Notes (Addendum)
[facility] PROGRESS NOTE:  Paged by RN regarding patient having headache. Patient evaluated at bedside. She reports having right sided neck pain that initially started two days prior that has now progressed and is now radiating to her right scapula and right side of her head. She reports this as feeling like someone is pounding in her head. Patient also reports just generalized feeling of discomfort and reports that she has never felt this way previously. Endorses nausea. She denies any chest pain, shortness of breath, abdominal pain or focal weakness.   Blood pressure (!) 150/101, pulse (!) 113, temperature 98.7 F (37.1 C), temperature source Oral, resp. rate 20, height '5\' 4"'$  (1.626 m), weight 81.9 kg, SpO2 95 %. Physical Exam  Constitutional: Appears well-developed and well-nourished. Mild distress (unable to find position of comfort) HENT: Normocephalic and atraumatic, EOMI, conjunctiva normal, moist mucous membranes Cardiovascular: Tachycardic, regular rhythm, S1 and S2 present, no murmurs, rubs, gallops.  Distal pulses intact Respiratory: Effort is normal; diffuse crackles on exam (R>L); on 2L O2 GI: Nondistended, soft, nontender to palpation, normal bowel sounds Musculoskeletal: Normal bulk and tone.  No peripheral edema noted. Neurological: Is alert and oriented x4, CN II-XII grossly intact; strength 5/5 in bilateral upper and lower extremities; spontaneously moving all extremities; slightly dysarthric speech but intelligible Skin: Warm and dry.  No rash, erythema, lesions noted. Psychiatric: Anxious mood  Patient did have fever to 101.7 around 8pm this evening that improved to 98.7 without medication. CXR from admission noted to have right suprahilar, left perihilar and left basilar infiltrates. This was thought to be secondary to volume overload for which patient received dialysis. Previously had hyperkalemia to 6.3 that improved to 4.2 with dialysis as well. She did have elevated procal to  1.20. Patient also noted to be significantly hypertensive at bedside to 167/113 and tachycardic despite recently receiving antihypertensives one hour prior to evaluation. Suspect her hypertension could be contributing to her headache.    Plan: Repeat CXR - if persistent infiltrates, will initiate antibiotics Acetaminophen '650mg'$  x1 IV labetalol '10mg'$  x1

## 2020-12-05 NOTE — ED Provider Notes (Signed)
[facility] EMERGENCY DEPARTMENT Provider Note   CSN: [id number] Arrival date & time: 12/05/20  0252     History Chief Complaint  Patient presents with   Shortness of Breath    Latasha Drake is a 40 y.o. female.  [name] with a chief complaints of shortness of breath.  This been ongoing for the past few days.  Having a bit of a cough with it as well.  No fevers or chills no known sick contacts.  Patient gets dialysis Tuesday Thursday and Saturday.  She missed her session on Thursday and felt that she was already somewhat fluid overloaded at that time.  She feels like she is got significantly worse since then.  Called EMS with worsening trouble breathing this evening.  Found to be hypoxic with EMS and started on oxygen.  Not on oxygen at home.  The history is provided by the patient.  Shortness of Breath Severity:  Severe Onset quality:  Gradual Duration:  2 days Timing:  Constant Progression:  Worsening Chronicity:  New Relieved by:  Nothing Worsened by:  Nothing Ineffective treatments:  None tried Associated symptoms: chest pain and cough   Associated symptoms: no fever, no headaches, no vomiting and no wheezing       Past Medical History:  Diagnosis Date   Anemia    Chronic kidney disease    Hypertension    Lupus (HCC)    Pericardial effusion 04/13/2019   circumferential   Pneumonia 04/12/2019   Pulmonary hypertension ([place]) 04/13/2019   moderate    Patient Active Problem List   Diagnosis Date Noted   Pleural effusion    Acute respiratory failure with hypoxia ([place]) 04/19/2019   Chest pain 04/13/2019   ESRD (end stage renal disease) ([place]) 04/13/2019   Pneumonia 04/12/2019   Acute on chronic blood loss anemia 10/04/2018   Symptomatic anemia 10/04/2018   Lupus ([place])    Hypertension    Chronic kidney disease    Acute renal failure superimposed on chronic kidney disease (HCC)    Elevated d-dimer    Pericardial effusion    Traumatic perinephric  hematoma of left kidney     Past Surgical History:  Procedure Laterality Date   CESAREAN SECTION     HERNIA REPAIR     IR FLUORO GUIDE CV LINE RIGHT  04/19/2019   IR FLUORO GUIDE CV LINE RIGHT  04/22/2019   IR US GUIDE VASC ACCESS RIGHT  04/19/2019   IR US GUIDE VASC ACCESS RIGHT  04/22/2019     OB History   No obstetric history on file.     Family History  Problem Relation Age of Onset   Lupus Other        cousin    Social History   Tobacco Use   Smoking status: Every Day    Packs/day: 0.50    Types: Cigarettes   Smokeless tobacco: Never  Substance Use Topics   Alcohol use: Yes    Comment: occassionally   Drug use: No    Home Medications Prior to Admission medications   Medication Sig Start Date End Date Taking? Authorizing Provider  amLODipine (NORVASC) 10 MG tablet Take 10 mg by mouth daily. 09/29/20  Yes [provider]  citalopram (CELEXA) 20 MG tablet Take 20 mg by mouth daily. 11/28/20  Yes [provider]  cloNIDine (CATAPRES) 0.1 MG tablet Take 0.1 mg by mouth 3 (three) times daily. 09/29/20  Yes [provider]  cyclobenzaprine (FLEXERIL) 10 MG tablet  Take 10 mg by mouth 3 (three) times daily as needed for muscle spasms.  04/03/19  Yes [provider]  diclofenac Sodium (VOLTAREN) 1 % GEL Apply 4 g topically 4 (four) times daily. Patient taking differently: Apply 4 g topically 4 (four) times daily as needed (pain). 04/26/19  Yes Mikhail, Velta Addison, DO  hydrALAZINE (APRESOLINE) 100 MG tablet Take 1 tablet (100 mg total) by mouth 3 (three) times daily. 04/26/19  Yes Mikhail, Velta Addison, DO  labetalol (NORMODYNE) 300 MG tablet Take 300 mg by mouth 2 (two) times daily.   Yes [provider]  methocarbamol (ROBAXIN) 750 MG tablet Take 750 mg by mouth 3 (three) times daily. 11/23/20  Yes [provider]  pantoprazole (PROTONIX) 40 MG tablet Take 40 mg by mouth daily as needed (indigestion).  10/27/16  Yes [provider]  paragard intrauterine copper IUD IUD 1 Intra Uterine Device by Intrauterine route once.   Yes [provider]  pregabalin (LYRICA) 150 MG capsule Take 150 mg by mouth 2 (two) times daily. 09/16/20  Yes [provider]  rOPINIRole (REQUIP) 2 MG tablet Take 2 mg by mouth at bedtime. 10/01/20  Yes [provider]  sevelamer carbonate (RENVELA) 800 MG tablet Take 1,600 mg by mouth 3 (three) times daily with meals. 08/26/20  Yes [provider]  CIPRO HC OTIC suspension Place 4 drops into the right ear See admin instructions. Bid x 10 days Patient not taking: Reported on 12/05/2020 11/24/20   [provider]  cloNIDine (CATAPRES - DOSED IN MG/24 HR) 0.1 mg/24hr patch Place 1 patch (0.1 mg total) onto the skin once a week. Patient not taking: No sig reported 04/29/19   Cristal Ford, DO    Allergies    Baclofen and Lisinopril  Review of Systems   Review of Systems  Constitutional:  Negative for chills and fever.  HENT:  Negative for congestion and rhinorrhea.   Eyes:  Negative for redness and visual disturbance.  Respiratory:  Positive for cough and shortness of breath. Negative for wheezing.   Cardiovascular:  Positive for chest pain. Negative for palpitations.  Gastrointestinal:  Negative for nausea and vomiting.  Genitourinary:  Negative for dysuria and urgency.  Musculoskeletal:  Negative for arthralgias and myalgias.  Skin:  Negative for pallor and wound.  Neurological:  Negative for dizziness and headaches.   Physical Exam Updated Vital Signs BP (!) 180/111   Pulse (!) 103   Temp 98.9 F (37.2 C) (Oral)   Resp (!) 26   Ht '5\' 4"'$  (1.626 m)   Wt 86.2 kg   SpO2 100%   BMI 32.61 kg/m   Physical Exam Vitals and nursing note reviewed.  Constitutional:      General: She is not in acute distress.    Appearance: She is well-developed. She is not diaphoretic.  HENT:     Head: Normocephalic and atraumatic.  Eyes:     Pupils: Pupils are  equal, round, and reactive to light.  Cardiovascular:     Rate and Rhythm: Normal rate and regular rhythm.     Heart sounds: No murmur heard.   No friction rub. No gallop.  Pulmonary:     Effort: Pulmonary effort is normal.     Breath sounds: No wheezing or rales.  Abdominal:     General: There is no distension.     Palpations: Abdomen is soft.     Tenderness: There is no abdominal tenderness.  Musculoskeletal:  General: No tenderness.     Cervical back: Normal range of motion and neck supple.  Skin:    General: Skin is warm and dry.  Neurological:     Mental Status: She is alert and oriented to person, place, and time.  Psychiatric:        Behavior: Behavior normal.    ED Results / Procedures / Treatments   Labs (all labs ordered are listed, but only abnormal results are displayed) Labs Reviewed  CBC WITH DIFFERENTIAL/PLATELET - Abnormal; Notable for the following components:      Result Value   WBC 10.7 (*)    RBC 2.73 (*)    Hemoglobin 7.4 (*)    HCT 21.7 (*)    MCV 79.5 (*)    Neutro Abs 8.5 (*)    All other components within normal limits  COMPREHENSIVE METABOLIC PANEL - Abnormal; Notable for the following components:   Potassium 6.2 (*)    CO2 17 (*)    Glucose, Bld 112 (*)    BUN 84 (*)    Creatinine, Ser 12.20 (*)    Calcium 8.0 (*)    Total Bilirubin 2.1 (*)    GFR, Estimated 4 (*)    Anion gap 19 (*)    All other components within normal limits  BRAIN NATRIURETIC PEPTIDE - Abnormal; Notable for the following components:   B Natriuretic Peptide 825.5 (*)    All other components within normal limits  I-STAT CHEM 8, ED - Abnormal; Notable for the following components:   Potassium 6.1 (*)    BUN 89 (*)    Creatinine, Ser 13.30 (*)    Glucose, Bld 107 (*)    Calcium, Ion 0.97 (*)    Hemoglobin 7.8 (*)    HCT 23.0 (*)    All other components within normal limits  RESP PANEL BY RT-PCR (FLU A&B, COVID) ARPGX2  TROPONIN I (HIGH SENSITIVITY)     EKG EKG Interpretation  Date/Time:  Saturday December 05 2020 02:59:03 EDT Ventricular Rate:  112 PR Interval:  132 QRS Duration: 97 QT Interval:  347 QTC Calculation: 474 R Axis:   66 Text Interpretation: Sinus tachycardia Probable left atrial enlargement peaked t waves Otherwise no significant change Confirmed by Deno Etienne [phone]) on 12/05/2020 3:24:26 AM  Radiology DG Chest Port 1 View  Result Date: 12/05/2020 CLINICAL DATA:  Shortness of breath. EXAM: PORTABLE CHEST 1 VIEW COMPARISON:  November 04, 2020 FINDINGS: Moderate to marked severity right suprahilar, left perihilar and left basilar infiltrates are seen. There is mild elevation of the right hemidiaphragm. No pleural effusion or pneumothorax is identified. The heart size and mediastinal contours are within normal limits. There is moderate severity calcification of the aortic arch. The visualized skeletal structures are unremarkable. IMPRESSION: Moderate to marked severity right suprahilar, left perihilar and left basilar infiltrates. Electronically Signed   By: Virgina Norfolk M.D.   On: 12/05/2020 03:27    Procedures Procedures   Medications Ordered in ED Medications  nitroGLYCERIN (NITROSTAT) SL tablet 0.4 mg (0.4 mg Sublingual Given 12/05/20 0414)  diazepam (VALIUM) tablet 2 mg (has no administration in time range)  ipratropium-albuterol (DUONEB) 0.5-2.5 (3) MG/3ML nebulizer solution 3 mL (3 mLs Nebulization Given 12/05/20 0334)  HYDROmorphone (DILAUDID) injection 0.5 mg (0.5 mg Intravenous Given 12/05/20 0412)  sodium zirconium cyclosilicate (LOKELMA) packet 10 g (10 g Oral Given 12/05/20 0446)  insulin aspart (novoLOG) injection 5 Units (5 Units Intravenous Given 12/05/20 0444)    And  dextrose 50 %  solution 50 mL (50 mLs Intravenous Given 12/05/20 0442)    ED Course  I have reviewed the triage vital signs and the nursing notes.  Pertinent labs & imaging results that were available during my care of the patient were  reviewed by me and considered in my medical decision making (see chart for details).    MDM Rules/Calculators/A&P                           40 yo F with a cc of shortness of breath.  She feels like she is fluid overloaded after missing dialysis on Thursday.  Supposed get dialysis this morning.  Patient requiring oxygen on oxygen at home.  Will obtain a chest x-ray blood work reassess.  Chest x-ray viewed by me consistent with fluid overload.  Patient has become increasingly tachypneic and is tripoding.  Will place on BiPAP.  I-STAT Chem-8 with a potassium of 6.1, peaked T waves.  Will give a dose of Lokelma and insulin and dextrose.  Will discuss with nephrology.  CRITICAL CARE Performed by: Cecilio Asper   Total critical care time: 35 minutes  Critical care time was exclusive of separately billable procedures and treating other patients.  Critical care was necessary to treat or prevent imminent or life-threatening deterioration.  Critical care was time spent personally by me on the following activities: development of treatment plan with patient and/or surrogate as well as nursing, discussions with consultants, evaluation of patient's response to treatment, examination of patient, obtaining history from patient or surrogate, ordering and performing treatments and interventions, ordering and review of laboratory studies, ordering and review of radiographic studies, pulse oximetry and re-evaluation of patient's condition.  The patients results and plan were reviewed and discussed.   Any x-rays performed were independently reviewed by myself.   Differential diagnosis were considered with the presenting HPI.  Medications  nitroGLYCERIN (NITROSTAT) SL tablet 0.4 mg (0.4 mg Sublingual Given 12/05/20 0414)  diazepam (VALIUM) tablet 2 mg (has no administration in time range)  ipratropium-albuterol (DUONEB) 0.5-2.5 (3) MG/3ML nebulizer solution 3 mL (3 mLs Nebulization Given 12/05/20  0334)  HYDROmorphone (DILAUDID) injection 0.5 mg (0.5 mg Intravenous Given 12/05/20 0412)  sodium zirconium cyclosilicate (LOKELMA) packet 10 g (10 g Oral Given 12/05/20 0446)  insulin aspart (novoLOG) injection 5 Units (5 Units Intravenous Given 12/05/20 0444)    And  dextrose 50 % solution 50 mL (50 mLs Intravenous Given 12/05/20 0442)    Vitals:   12/05/20 0530 12/05/20 0545 12/05/20 0600 12/05/20 0615  BP: (!) 169/108 (!) 174/102 (!) 179/108 (!) 180/111  Pulse: (!) 102 (!) 104 (!) 106 (!) 103  Resp: (!) 25 (!) 26 20 (!) 26  Temp:      TempSrc:      SpO2: 100% 100% 100% 100%  Weight:      Height:        Final diagnoses:  Acute pulmonary edema (HCC)  Hyperkalemia     Final Clinical Impression(s) / ED Diagnoses Final diagnoses:  Acute pulmonary edema (HCC)  Hyperkalemia    Rx / DC Orders ED Discharge Orders     None        Deno Etienne, DO 12/05/20 [id number]

## 2020-12-05 NOTE — Progress Notes (Signed)
Patient ID: Latasha Drake, female   DOB: [birth year], 40 y.o.   MRN: [id number] 40 year old woman with history of end-stage renal disease on a TTS schedule who unfortunately has been having problems with adherence and missed her last dialysis on Thursday (9/29) presenting to the emergency room yesterday evening with progressively worsening shortness of breath.  She was transiently placed on BiPAP and is currently on a nonrebreather mask undergoing hemodialysis with some complaints of chest discomfort.  Requesting Dilaudid and declining Tylenol.  She is going to be sent back to the emergency room after dialysis to determine the need for admission versus stability to discharge home.  Patient seen on Hemodialysis. BP (!) 171/105 (BP Location: Right Arm)   Pulse (!) 104   Temp 97.6 F (36.4 C) (Oral)   Resp (!) 31   Ht '5\' 4"'$  (1.626 m)   Wt 86.2 kg   SpO2 100%   BMI 32.61 kg/m   QB 400, UF goal 4.5L  Elmarie Shiley MD [facility]. Office # [phone] Pager # [phone] 9:19 AM

## 2020-12-05 NOTE — ED Notes (Signed)
RT at bedside placing patient on NRB mask. Dialysis ready for patient.

## 2020-12-05 NOTE — ED Notes (Signed)
IV team at pt bedside 

## 2020-12-05 NOTE — ED Triage Notes (Signed)
Pt bib EMS from home for SOB. T/Th/Sat dialysis pt, missed Thursday's appt. Found at 88% on room air at home, 96% on 4L nasal cannula.   EMS vitals 200/103 HR 110

## 2020-12-06 ENCOUNTER — Other Ambulatory Visit: Payer: Self-pay

## 2020-12-06 ENCOUNTER — Encounter (HOSPITAL_COMMUNITY): Payer: Self-pay | Admitting: Internal Medicine

## 2020-12-06 ENCOUNTER — Observation Stay (HOSPITAL_COMMUNITY): Payer: Medicare Other

## 2020-12-06 DIAGNOSIS — Z888 Allergy status to other drugs, medicaments and biological substances status: Secondary | ICD-10-CM | POA: Diagnosis not present

## 2020-12-06 DIAGNOSIS — F17210 Nicotine dependence, cigarettes, uncomplicated: Secondary | ICD-10-CM | POA: Diagnosis present

## 2020-12-06 DIAGNOSIS — Z7682 Awaiting organ transplant status: Secondary | ICD-10-CM | POA: Diagnosis not present

## 2020-12-06 DIAGNOSIS — F419 Anxiety disorder, unspecified: Secondary | ICD-10-CM | POA: Diagnosis present

## 2020-12-06 DIAGNOSIS — G2581 Restless legs syndrome: Secondary | ICD-10-CM | POA: Diagnosis present

## 2020-12-06 DIAGNOSIS — E8770 Fluid overload, unspecified: Secondary | ICD-10-CM | POA: Diagnosis present

## 2020-12-06 DIAGNOSIS — F32A Depression, unspecified: Secondary | ICD-10-CM | POA: Diagnosis present

## 2020-12-06 DIAGNOSIS — J189 Pneumonia, unspecified organism: Secondary | ICD-10-CM | POA: Diagnosis present

## 2020-12-06 DIAGNOSIS — G8929 Other chronic pain: Secondary | ICD-10-CM | POA: Diagnosis present

## 2020-12-06 DIAGNOSIS — Z79899 Other long term (current) drug therapy: Secondary | ICD-10-CM | POA: Diagnosis not present

## 2020-12-06 DIAGNOSIS — Z9115 Patient's noncompliance with renal dialysis: Secondary | ICD-10-CM | POA: Diagnosis not present

## 2020-12-06 DIAGNOSIS — Z28310 Unvaccinated for covid-19: Secondary | ICD-10-CM | POA: Diagnosis not present

## 2020-12-06 DIAGNOSIS — D631 Anemia in chronic kidney disease: Secondary | ICD-10-CM | POA: Diagnosis present

## 2020-12-06 DIAGNOSIS — D509 Iron deficiency anemia, unspecified: Secondary | ICD-10-CM | POA: Diagnosis present

## 2020-12-06 DIAGNOSIS — Z992 Dependence on renal dialysis: Secondary | ICD-10-CM | POA: Diagnosis not present

## 2020-12-06 DIAGNOSIS — N186 End stage renal disease: Secondary | ICD-10-CM | POA: Diagnosis present

## 2020-12-06 DIAGNOSIS — I272 Pulmonary hypertension, unspecified: Secondary | ICD-10-CM | POA: Diagnosis present

## 2020-12-06 DIAGNOSIS — M3214 Glomerular disease in systemic lupus erythematosus: Secondary | ICD-10-CM | POA: Diagnosis present

## 2020-12-06 DIAGNOSIS — R0602 Shortness of breath: Secondary | ICD-10-CM | POA: Diagnosis present

## 2020-12-06 DIAGNOSIS — E875 Hyperkalemia: Secondary | ICD-10-CM | POA: Diagnosis present

## 2020-12-06 DIAGNOSIS — J9601 Acute respiratory failure with hypoxia: Secondary | ICD-10-CM | POA: Diagnosis present

## 2020-12-06 DIAGNOSIS — J810 Acute pulmonary edema: Secondary | ICD-10-CM | POA: Diagnosis present

## 2020-12-06 DIAGNOSIS — I120 Hypertensive chronic kidney disease with stage 5 chronic kidney disease or end stage renal disease: Secondary | ICD-10-CM | POA: Diagnosis present

## 2020-12-06 DIAGNOSIS — Z20822 Contact with and (suspected) exposure to covid-19: Secondary | ICD-10-CM | POA: Diagnosis present

## 2020-12-06 DIAGNOSIS — M545 Low back pain, unspecified: Secondary | ICD-10-CM | POA: Diagnosis present

## 2020-12-06 LAB — BASIC METABOLIC PANEL
Anion gap: 16 — ABNORMAL HIGH (ref 5–15)
BUN: 39 mg/dL — ABNORMAL HIGH (ref 6–20)
CO2: 25 mmol/L (ref 22–32)
Calcium: 8 mg/dL — ABNORMAL LOW (ref 8.9–10.3)
Chloride: 97 mmol/L — ABNORMAL LOW (ref 98–111)
Creatinine, Ser: 7.53 mg/dL — ABNORMAL HIGH (ref 0.44–1.00)
GFR, Estimated: 6 mL/min — ABNORMAL LOW (ref >60)
Glucose, Bld: 113 mg/dL — ABNORMAL HIGH (ref 70–99)
Potassium: 4.8 mmol/L (ref 3.5–5.1)
Sodium: 138 mmol/L (ref 135–145)

## 2020-12-06 LAB — CBC
HCT: 19.7 % — ABNORMAL LOW (ref 36.0–46.0)
HCT: 21.5 % — ABNORMAL LOW (ref 36.0–46.0)
Hemoglobin: 6.7 g/dL — CL (ref 12.0–15.0)
Hemoglobin: 7.5 g/dL — ABNORMAL LOW (ref 12.0–15.0)
MCH: 26.4 pg (ref 26.0–34.0)
MCH: 27.4 pg (ref 26.0–34.0)
MCHC: 34 g/dL (ref 30.0–36.0)
MCHC: 34.9 g/dL (ref 30.0–36.0)
MCV: 77.6 fL — ABNORMAL LOW (ref 80.0–100.0)
MCV: 78.5 fL — ABNORMAL LOW (ref 80.0–100.0)
Platelets: 179 10*3/uL (ref 150–400)
Platelets: 161 10*3/uL (ref 150–400)
RBC: 2.54 MIL/uL — ABNORMAL LOW (ref 3.87–5.11)
RBC: 2.74 MIL/uL — ABNORMAL LOW (ref 3.87–5.11)
RDW: 14.6 % (ref 11.5–15.5)
RDW: 14.6 % (ref 11.5–15.5)
WBC: 8.5 10*3/uL (ref 4.0–10.5)
WBC: 8.8 10*3/uL (ref 4.0–10.5)
nRBC: 0 % (ref 0.0–0.2)
nRBC: 0 % (ref 0.0–0.2)

## 2020-12-06 LAB — HEMOGLOBIN AND HEMATOCRIT, BLOOD
HCT: 21.2 % — ABNORMAL LOW (ref 36.0–46.0)
Hemoglobin: 7.1 g/dL — ABNORMAL LOW (ref 12.0–15.0)

## 2020-12-06 LAB — HEPATITIS B SURFACE ANTIBODY,QUALITATIVE: Hep B S Ab: REACTIVE — AB

## 2020-12-06 LAB — PREPARE RBC (CROSSMATCH)

## 2020-12-06 LAB — HEPATITIS B SURFACE ANTIBODY, QUANTITATIVE: Hep B S AB Quant (Post): 10.2 m[IU]/mL (ref >9.9)

## 2020-12-06 MED ORDER — SODIUM CHLORIDE 0.9 % IV SOLN
INTRAVENOUS | Status: DC | PRN
  Administered 2020-12-06: 250 mL via INTRAVENOUS

## 2020-12-06 MED ORDER — OXYCODONE HCL 5 MG PO TABS
5.0000 mg | ORAL_TABLET | Freq: Once | ORAL | Status: AC
  Administered 2020-12-06: 5 mg via ORAL
  Filled 2020-12-06: qty 1

## 2020-12-06 MED ORDER — ACETAMINOPHEN 500 MG PO TABS
1000.0000 mg | ORAL_TABLET | Freq: Once | ORAL | Status: AC
  Administered 2020-12-06: 1000 mg via ORAL
  Filled 2020-12-06: qty 2

## 2020-12-06 MED ORDER — SODIUM CHLORIDE 0.9 % IV SOLN
1.0000 g | Freq: Every day | INTRAVENOUS | Status: DC
  Administered 2020-12-06 – 2020-12-07 (×2): 1 g via INTRAVENOUS
  Filled 2020-12-06 (×2): qty 10

## 2020-12-06 MED ORDER — GUAIFENESIN ER 600 MG PO TB12
600.0000 mg | ORAL_TABLET | Freq: Two times a day (BID) | ORAL | Status: DC
  Administered 2020-12-06 – 2020-12-07 (×2): 600 mg via ORAL
  Filled 2020-12-06 (×3): qty 1

## 2020-12-06 MED ORDER — SODIUM CHLORIDE 0.9% IV SOLUTION: Freq: Once | INTRAVENOUS | Status: AC

## 2020-12-06 MED ORDER — SODIUM CHLORIDE 0.9 % IV SOLN
500.0000 mg | Freq: Every day | INTRAVENOUS | Status: DC
  Administered 2020-12-06 – 2020-12-07 (×2): 500 mg via INTRAVENOUS
  Filled 2020-12-06 (×2): qty 500

## 2020-12-06 NOTE — Progress Notes (Signed)
40 yo F with ESRD  on HD TTS ,[facility] , ho noncompliance attending hd , had missed recent txs and was admitted to Ob[facility]  with Hyperkalemia / volume overload , Anemia (she has missed her ESA doses and is due next tx )Hd yest with 4 l uf , K now 4.8  bp stable , noted being  transfused with Rbcs hgb 6.7 . No acute HD needs today and plan for Formal consult if admit to Inpatient status , Next HD would be Tuesday 12/08/20.  OP HD TTS  =[facility]  3.5 hr edw 74.5 kg  2k,2.5ca  No Hep Mircera 225 mcg next  hd  Hec 2mg  LUA AVF    DErnest Haber PA-C [facility] [phone]/04/2020,4:24 PM  LOS: 0 days

## 2020-12-06 NOTE — Progress Notes (Signed)
Pt resting comfortably on 2L [place]. Does not need BiPAP at this moment, will continue to monitor.

## 2020-12-06 NOTE — Progress Notes (Signed)
Subjective: Overnight, trop x2 flat but then the patient complained of headache and right-sided neck pain that radiated to her right scapula and right side of her head.  Was also febrile to 101.7 overnight, this resolved without medication.  When evaluated at bedside overnight, she was hypertensive to 167/113 and was tachycardic despite receiving antihypertensives for an hour prior.  At that time, repeat chest x-ray showed stable infiltrates, started on ceftriaxone and azithromycin for CAP.  The patient was seen at bedside rounds this afternoon. Patient states that she no longer has a headache now, but she had somewhat of a rough night last night due to the pain. Has never had headache like this before, states that it is mostly located in the back of her head and neck area and feels like a "helmet on her head."  Otherwise, she has not had any shortness of breath, chest pain.  Patient endorses a continued dry cough and states that she does feel like she is sick.  There are no other complaints or concerns today.  Objective: Vital signs in last 24 hours: Vitals:   12/05/20 2205 12/05/20 2321 12/06/20 0005 12/06/20 0300  BP: (!) 150/101 (!) 135/94 133/82   Pulse: (!) 113 97 91   Resp: '20 16 20   '$ Temp: 98.7 F (37.1 C)  98.8 F (37.1 C)   TempSrc: Oral  Axillary   SpO2: 95% 100% 100%   Weight:    81.6 kg  Height:        General: NAD, nl appearance HE: Normocephalic, atraumatic , EOMI, Conjunctivae normal ENT: No congestion, no rhinorrhea, no exudate or erythema  Cardiovascular: Tachycardia, regular rhythm.  No murmurs, rubs, or gallops Pulmonary : Effort normal, breath sounds normal. Some scattered end-expiratory wheezes on forced expiration, but no crackles, rhonchi Abdominal: soft, nontender, nondistended Musculoskeletal: Trace bilateral pitting edema, no deformity, injury,or tenderness in extremities, Skin: Warm, dry , no bruising, erythema, or rash Psychiatric/Behavioral:  normal mood,  normal behavior   Assessment/Plan:  Active Problems:   Acute hypoxemic respiratory failure (HCC)   Was also febrile to 101.7 overnight, this resolved without medication.  When evaluated at bedside overnight, she was hypertensive to 167/113 and was tachycardic despite receiving antihypertensives for an hour prior.    #Acute hypoxic respiratory failure #ESRD on HD T/Th/Sat at [facility]  #CAP Patient endorsed new onset SOB in the setting of 2 missed HD sessions over the past 2 weeks.  She endorsed a similar episode 6 months ago after missing an HD session. Presented here yesterday, received urgent HD with relief of her symptoms and with improvement of her volume status. Initial CXR in the ED showed scattered infiltrates in the right suprahilar, left perihilar, and left basilar regions.  Presentation initially thought to be from volume overload state in the setting of missing 2 recent HD sessions. Overnight, patient had a new fever to 101.7, and this resolved without any medications. At that time, repeat chest x-ray showed stable infiltrates, and she started on ceftriaxone and azithromycin for CAP. She continues to be afebrile, WBC down to 8.5 from 10.7 yesterday. Acute hypoxic respiratory failure is likely multifactorial for the aforementioned reasons. Pt is currently on 2L HFNC, was not on home oxygen prior to admission. -Nephrology on board, appreciate recommendations -Trend BMP and CBC -Continue azithromycin and ceftriaxone regimens -Duonebs PRN -Nitroglycerin PRN   #Lupus Diagnosed in 2000. Was on steroids in the past, per review of records may have been on plaquenil in the past  though unclear after reviewing records. She is not currently on any medications for her lupus. -Continue to monitor   #HTN On clonidine 0.1 mg po TID, amlodipine 10 mg po qd, hydralazine 100 mg po TID, labetalol 300 mg po BID. BP noted to be up to 167/113 overnight, received IV labetalol 10 mg x 1.  BP today  have been stable, in the [id number] systolic.  Continue to monitor -Continue home medications   #Resting leg syndrome #Anxiety #Depression #Chronic lower back pain -Continue ropinirole 2 mg p.o. daily -Continue citalopram 20 mg p.o. daily -Continue robaxin 750 mg PO TID -Continue pregabalin 150 mg po BID   #Anemia Pt has a history of IDA and anemia of chronic disease. Hgb of 6.7 today, received 2 units PRBCs -Trend CBC -Post H/H pending  Prior to Admission Living Arrangement: Home Anticipated Discharge Location: Home Barriers to Discharge: Medical management of acute hypoxic respiratory failure Dispo: Anticipated discharge in approximately 1-2 day(s).   Orvis Brill, MD 12/06/2020, 6:08 AM Pager: [phone] After 5pm on weekdays and 1pm on weekends: On Call pager [phone]

## 2020-12-06 NOTE — Progress Notes (Signed)
Lab called. Critical lab ** 6.7 Hbg.  Will inform provider.

## 2020-12-06 NOTE — Care Management Obs Status (Signed)
[facility] NOTIFICATION   Patient Details  Name: Latasha Drake MRN: [id number] Date of Birth: Jul 22, 1980   Medicare Observation Status Notification Given:  Yes    Norina Buzzard, RN 12/06/2020, 3:17 PM

## 2020-12-06 NOTE — Plan of Care (Signed)

## 2020-12-07 DIAGNOSIS — J9601 Acute respiratory failure with hypoxia: Secondary | ICD-10-CM

## 2020-12-07 DIAGNOSIS — J189 Pneumonia, unspecified organism: Principal | ICD-10-CM

## 2020-12-07 DIAGNOSIS — J810 Acute pulmonary edema: Secondary | ICD-10-CM

## 2020-12-07 LAB — CBC
HCT: 21.6 % — ABNORMAL LOW (ref 36.0–46.0)
Hemoglobin: 7.4 g/dL — ABNORMAL LOW (ref 12.0–15.0)
MCH: 27.3 pg (ref 26.0–34.0)
MCHC: 34.3 g/dL (ref 30.0–36.0)
MCV: 79.7 fL — ABNORMAL LOW (ref 80.0–100.0)
Platelets: 179 10*3/uL (ref 150–400)
RBC: 2.71 MIL/uL — ABNORMAL LOW (ref 3.87–5.11)
RDW: 14.9 % (ref 11.5–15.5)
WBC: 9.4 10*3/uL (ref 4.0–10.5)
nRBC: 0 % (ref 0.0–0.2)

## 2020-12-07 LAB — BASIC METABOLIC PANEL
Anion gap: 19 — ABNORMAL HIGH (ref 5–15)
BUN: 58 mg/dL — ABNORMAL HIGH (ref 6–20)
CO2: 21 mmol/L — ABNORMAL LOW (ref 22–32)
Calcium: 8.1 mg/dL — ABNORMAL LOW (ref 8.9–10.3)
Chloride: 97 mmol/L — ABNORMAL LOW (ref 98–111)
Creatinine, Ser: 9.79 mg/dL — ABNORMAL HIGH (ref 0.44–1.00)
GFR, Estimated: 5 mL/min — ABNORMAL LOW (ref >60)
Glucose, Bld: 105 mg/dL — ABNORMAL HIGH (ref 70–99)
Potassium: 5 mmol/L (ref 3.5–5.1)
Sodium: 137 mmol/L (ref 135–145)

## 2020-12-07 LAB — TYPE AND SCREEN
ABO/RH(D): O POS
Antibody Screen: NEGATIVE
Unit division: 0

## 2020-12-07 LAB — BPAM RBC
Blood Product Expiration Date: 202210252359
ISSUE DATE / TIME: 202210020842
Unit Type and Rh: 5100

## 2020-12-07 LAB — GLUCOSE, CAPILLARY
Glucose-Capillary: 96 mg/dL (ref 70–99)
Glucose-Capillary: 74 mg/dL (ref 70–99)

## 2020-12-07 MED ORDER — DOXYCYCLINE HYCLATE 100 MG PO CAPS: 100.0000 mg | ORAL_CAPSULE | Freq: Two times a day (BID) | ORAL | 0 refills | Status: DC

## 2020-12-07 MED ORDER — CEFDINIR 300 MG PO CAPS: 300.0000 mg | ORAL_CAPSULE | Freq: Two times a day (BID) | ORAL | 0 refills | Status: DC

## 2020-12-07 MED ORDER — DARBEPOETIN ALFA 200 MCG/0.4ML IJ SOSY: 200.0000 ug | PREFILLED_SYRINGE | INTRAMUSCULAR | Status: DC

## 2020-12-07 MED ORDER — CEFDINIR 300 MG PO CAPS: 300.0000 mg | ORAL_CAPSULE | ORAL | 0 refills | Status: DC

## 2020-12-07 NOTE — Discharge Instructions (Addendum)
You were admitted to the hospital for having some extra fluid on you after missing hemodialysis.  While you are in the hospital, you were put on oxygen but are now breathing well without it.  Your second chest x-ray showed these fluffy infiltrates in your chest, and since you had a fever the other night we decided to start you on antibiotics for pneumonia.  We have transitioned you to an oral antibiotic regimen.  Please take doxycycline as prescribed. For cefdinir, take one tablet (300 mg) after your hemodialysis session on Tuesday, and then again after your session on Thursday.  Follow-up with your primary care provider in the next week to discuss your hospital stay.

## 2020-12-07 NOTE — Progress Notes (Signed)
Mobility Specialist Progress Note   12/07/20 1052  Mobility  Activity Refused mobility   Pt refused because they are d/c soon and claim to have walked plenty in room.   Holland Falling Mobility Specialist Phone Number [phone]

## 2020-12-07 NOTE — Consult Note (Signed)
Renal Service Consult Note [facility]  Latasha Drake 12/07/2020 Sol Blazing, MD Requesting Physician: Dr Heber Huron, E.   Reason for Consult: ESRD pt w/ SOB, hypoxia HPI: The patient is a 40 y.o. year-old w/ hx of anemia, ESRD on HD, HTN, SLE, PNA and pHTN presented to ED w/ Raliegh Ip and volume overload on 10/01. Hb was 6.7, she rec'd prbc's. Pt was seen by renal service on OBV status and rec'd HD on 10/01. Her SOB resolved per the pt today. Asked to see for ESRD.    Pt on HD x 2 yrs. Pt states that she "hates going" and has missed HD on several occassions. She has a kidney donor but was downgraded due to RLS recently.  She states she will have to "take dialysis more seriously " after going through this episode of resp distress and fluid overload.    ROS - denies CP, no joint pain, no HA, no blurry vision, no rash, no diarrhea, no nausea/ vomiting, no dysuria, no difficulty voiding   Past Medical History  Past Medical History:  Diagnosis Date   Anemia    Chronic kidney disease    Hypertension    Lupus (HCC)    Pericardial effusion 04/13/2019   circumferential   Pneumonia 04/12/2019   Pulmonary hypertension ([place]) 04/13/2019   moderate   Past Surgical History  Past Surgical History:  Procedure Laterality Date   CESAREAN SECTION     HERNIA REPAIR     IR FLUORO GUIDE CV LINE RIGHT  04/19/2019   IR FLUORO GUIDE CV LINE RIGHT  04/22/2019   IR US GUIDE VASC ACCESS RIGHT  04/19/2019   IR US GUIDE VASC ACCESS RIGHT  04/22/2019   Family History  Family History  Problem Relation Age of Onset   Lupus Other        cousin   Social History  reports that she has been smoking cigarettes. She has been smoking an average of .5 packs per day. She has never used smokeless tobacco. She reports current alcohol use. She reports that she does not use drugs. Allergies  Allergies  Allergen Reactions   Baclofen Other (See Comments)    Tremors   Lisinopril Swelling   Home  medications Prior to Admission medications   Medication Sig Start Date End Date Taking? Authorizing Provider  amLODipine (NORVASC) 10 MG tablet Take 10 mg by mouth daily. 09/29/20  Yes [provider]  citalopram (CELEXA) 20 MG tablet Take 20 mg by mouth daily. 11/28/20  Yes [provider]  cloNIDine (CATAPRES) 0.1 MG tablet Take 0.1 mg by mouth 3 (three) times daily. 09/29/20  Yes [provider]  cyclobenzaprine (FLEXERIL) 10 MG tablet Take 10 mg by mouth 3 (three) times daily as needed for muscle spasms.  04/03/19  Yes [provider]  diclofenac Sodium (VOLTAREN) 1 % GEL Apply 4 g topically 4 (four) times daily. Patient taking differently: Apply 4 g topically 4 (four) times daily as needed (pain). 04/26/19  Yes Mikhail, Velta Addison, DO  hydrALAZINE (APRESOLINE) 100 MG tablet Take 1 tablet (100 mg total) by mouth 3 (three) times daily. 04/26/19  Yes Mikhail, Velta Addison, DO  labetalol (NORMODYNE) 300 MG tablet Take 300 mg by mouth 2 (two) times daily.   Yes [provider]  methocarbamol (ROBAXIN) 750 MG tablet Take 750 mg by mouth 3 (three) times daily. 11/23/20  Yes [provider]  pantoprazole (PROTONIX) 40 MG tablet Take 40 mg by mouth daily as needed (indigestion).  10/27/16  Yes [provider]  paragard intrauterine copper IUD IUD 1 Intra Uterine Device by Intrauterine route once.   Yes [provider]  pregabalin (LYRICA) 150 MG capsule Take 150 mg by mouth 2 (two) times daily. 09/16/20  Yes [provider]  rOPINIRole (REQUIP) 2 MG tablet Take 2 mg by mouth at bedtime. 10/01/20  Yes [provider]  sevelamer carbonate (RENVELA) 800 MG tablet Take 1,600 mg by mouth 3 (three) times daily with meals. 08/26/20  Yes [provider]  CIPRO HC OTIC suspension Place 4 drops into the right ear See admin instructions. Bid x 10 days Patient not taking: Reported on 12/05/2020 11/24/20   [provider]   cloNIDine (CATAPRES - DOSED IN MG/24 HR) 0.1 mg/24hr patch Place 1 patch (0.1 mg total) onto the skin once a week. Patient not taking: No sig reported 04/29/19   Cristal Ford, DO     Vitals:   12/06/20 2327 12/07/20 0300 12/07/20 0600 12/07/20 0907  BP: 119/80 (!) 141/81  139/88  Pulse: 87 90  87  Resp: '18 18  18  '$ Temp: 98 F (36.7 C) 99.6 F (37.6 C)    TempSrc: Oral Oral    SpO2: 98% 98%    Weight:   81.7 kg   Height:       Exam  alert, nad   no jvd  Chest cta bilat  Cor reg no RG  Abd soft ntnd no ascites   Ext no LE edema   Alert, NF, ox3  LUA AVF+bruit     OP HD: TTS HD  3.5h  74.5kg  2/2.5 bath  LUA AVF  Hep none  - mircera 225 q2 , due next HD  - hect 4 ug tiw   Assessment/ Plan: Acute hypoxemic resp failure - due to vol overload, +/- PNA. Symptoms are much better after 4L UF w/ HD on Sat. Pt is still up 6-7kg by wts but is on RA.   ESRD - on HD TTS. HD tomorrow is still here.  HTN Anemia ckd - give esa next HD MBD ckd - cont binders      Kelly Splinter  MD 12/07/2020, 10:44 AM  Recent Labs  Lab 12/06/20 1950 12/07/20 0247  WBC 8.8 9.4  HGB 7.5* 7.4*   Recent Labs  Lab 12/06/20 0418 12/07/20 0247  K 4.8 5.0  BUN 39* 58*  CREATININE 7.53* 9.79*  CALCIUM 8.0* 8.1*

## 2020-12-07 NOTE — TOC Initial Note (Signed)
Transition of Care [facility]) - Initial/Assessment Note    Patient Details  Name: Latasha Drake MRN: [id number] Date of Birth: 1980/07/16  [facility]) CM/SW Contact:    Zenon Mayo, RN Phone Number: 12/07/2020, 1:36 PM  Clinical Narrative:                 NCM spoke with patient , she has transportation at dc, she has no issues with medications.   Expected Discharge Plan: Home/Self Care Barriers to Discharge: No Barriers Identified   Patient Goals and CMS Choice Patient states their goals for this hospitalization and ongoing recovery are:: return home   Choice offered to / list presented to : NA  Expected Discharge Plan and Services Expected Discharge Plan: Home/Self Care   Discharge Planning Services: CM Consult   Living arrangements for the past 2 months: Single Family Home Expected Discharge Date: 12/07/20                 DME Agency: NA       HH Arranged: NA          Prior Living Arrangements/Services Living arrangements for the past 2 months: Single Family Home Lives with:: Spouse Patient language and need for interpreter reviewed:: Yes Do you feel safe going back to the place where you live?: Yes      Need for Family Participation in Patient Care: Yes (Comment) Care giver support system in place?: Yes (comment)   Criminal Activity/Legal Involvement Pertinent to Current Situation/Hospitalization: No - Comment as needed  Activities of Daily Living Home Assistive Devices/Equipment: None ADL Screening (condition at time of admission) Patient's cognitive ability adequate to safely complete daily activities?: Yes Is the patient deaf or have difficulty hearing?: No Does the patient have difficulty seeing, even when wearing glasses/contacts?: No Does the patient have difficulty concentrating, remembering, or making decisions?: No Patient able to express need for assistance with ADLs?: Yes Does the patient have difficulty dressing or bathing?:  No Independently performs ADLs?: Yes (appropriate for developmental age) Does the patient have difficulty walking or climbing stairs?: No Weakness of Legs: None Weakness of Arms/Hands: None  Permission Sought/Granted                  Emotional Assessment Appearance:: Appears stated age Attitude/Demeanor/Rapport: Engaged Affect (typically observed): Appropriate Orientation: : Oriented to Situation, Oriented to  Time, Oriented to Place, Oriented to Self Alcohol / Substance Use: Not Applicable    Admission diagnosis:  Hyperkalemia [E87.5] Acute pulmonary edema ([place]) [J81.0] Acute hypoxemic respiratory failure ([place]) [J96.01] Patient Active Problem List   Diagnosis Date Noted   Acute hypoxemic respiratory failure ([place]) 12/05/2020   Pleural effusion    Acute respiratory failure with hypoxia ([place]) 04/19/2019   Chest pain 04/13/2019   ESRD (end stage renal disease) ([place]) 04/13/2019   Pneumonia 04/12/2019   Acute on chronic blood loss anemia 10/04/2018   Symptomatic anemia 10/04/2018   Lupus ([place])    Hypertension    Chronic kidney disease    Acute renal failure superimposed on chronic kidney disease ([place])    Elevated d-dimer    Pericardial effusion    Traumatic perinephric hematoma of left kidney    PCP:  Katherina Mires, MD Pharmacy:   [facility] [phone] [place], [place] - [address] [place] [address] Phone: [phone] Fax: [phone]  [id number] [place] [address]:  [phone] Fax: [phone]     Social Determinants of Health (SDOH) Interventions    Readmission Risk Interventions Readmission Risk Prevention Plan 12/07/2020  Transportation Screening Complete  PCP or Specialist Appt within 3-5 Days Complete  HRI or [place] Complete  Social Work Consult for [place] Planning/Counseling Complete  Palliative Care Screening  Not Applicable  Medication Review Press photographer) Complete  Some recent data might be hidden

## 2020-12-07 NOTE — TOC Transition Note (Signed)
Transition of Care [facility]) - CM/SW Discharge Note   Patient Details  Name: Adylyn Heiting MRN: [id number] Date of Birth: March 15, 1980  Transition [facility] Care [facility]) CM/SW Contact:  Zenon Mayo, RN Phone Number: 12/07/2020, 1:37 PM   Clinical Narrative:    NCM spoke with patient , she has transportation at dc, she has no issues with medications.     Final next level of care: Home/Self Care Barriers to Discharge: No Barriers Identified   Patient Goals and CMS Choice Patient states their goals for this hospitalization and ongoing recovery are:: return home   Choice offered to / list presented to : NA  Discharge Placement                       Discharge Plan and Services   Discharge Planning Services: CM Consult              DME Agency: NA       HH Arranged: NA          Social Determinants of Health (SDOH) Interventions     Readmission Risk Interventions Readmission Risk Prevention Plan 12/07/2020  Transportation Screening Complete  PCP or Specialist Appt within 3-5 Days Complete  HRI or [place] Complete  Social Work Consult for [place] Planning/Counseling Complete  Palliative Care Screening Not Applicable  Medication Review Press photographer) Complete  Some recent data might be hidden

## 2020-12-07 NOTE — Discharge Summary (Signed)
Name: Latasha Drake MRN: [id number] DOB: [birth year] 40 y.o. PCP: Katherina Mires, MD  Date of Admission: 12/05/2020  2:52 AM Date of Discharge: 12/07/2020  2:08 PM Attending Physician: No att. providers found  Discharge Diagnosis: 1.  Acute hypoxic respiratory failure 2.  ESRD on HD T/Th/Sat at [facility]  3.  Community-acquired pneumonia 4.  Hypertension 5.  Iron deficiency anemia, anemia of chronic disease  Discharge Medications: Allergies as of 12/07/2020       Reactions   Baclofen Other (See Comments)   Tremors   Lisinopril Swelling        Medication List     STOP taking these medications    Cipro HC OTIC suspension Generic drug: ciprofloxacin-hydrocortisone   cloNIDine 0.1 mg/24hr patch Commonly known as: CATAPRES - Dosed in mg/24 hr   diclofenac Sodium 1 % Gel Commonly known as: VOLTAREN       TAKE these medications    amLODipine 10 MG tablet Commonly known as: NORVASC Take 10 mg by mouth daily.   cefdinir 300 MG capsule Commonly known as: OMNICEF Take 1 capsule (300 mg total) by mouth Every Tuesday,Thursday,and Saturday with dialysis. Start taking on: December 08, 2020   citalopram 20 MG tablet Commonly known as: CELEXA Take 20 mg by mouth daily.   cloNIDine 0.1 MG tablet Commonly known as: CATAPRES Take 0.1 mg by mouth 3 (three) times daily.   cyclobenzaprine 10 MG tablet Commonly known as: FLEXERIL Take 10 mg by mouth 3 (three) times daily as needed for muscle spasms.   doxycycline 100 MG capsule Commonly known as: VIBRAMYCIN Take 1 capsule (100 mg total) by mouth 2 (two) times daily.   hydrALAZINE 100 MG tablet Commonly known as: APRESOLINE Take 1 tablet (100 mg total) by mouth 3 (three) times daily.   labetalol 300 MG tablet Commonly known as: NORMODYNE Take 300 mg by mouth 2 (two) times daily.   methocarbamol 750 MG tablet Commonly known as: ROBAXIN Take 750 mg by mouth 3 (three) times daily.   pantoprazole 40 MG  tablet Commonly known as: PROTONIX Take 40 mg by mouth daily as needed (indigestion).   paragard intrauterine copper Iud IUD 1 Intra Uterine Device by Intrauterine route once.   pregabalin 150 MG capsule Commonly known as: LYRICA Take 150 mg by mouth 2 (two) times daily.   rOPINIRole 2 MG tablet Commonly known as: REQUIP Take 2 mg by mouth at bedtime.   sevelamer carbonate 800 MG tablet Commonly known as: RENVELA Take 1,600 mg by mouth 3 (three) times daily with meals.        Disposition and follow-up:   Ms.Latasha Drake was discharged from [facility] in Good condition. At the hospital follow up visit please address:  1.  Community-acquired pneumonia: Patient will finish 3 more days of antibiotic regimen.  Specifically, patient will receive cefdinir on 10/4 after her HD and on 10/6 after HD. Otherwise, doxycyline for the next 3 days.  2.  Labs / imaging needed at time of follow-up: None  3.  Pending labs/ test needing follow-up: None  Follow-up Appointments:  Follow-up Information     Katherina Mires, MD. Go on 12/18/2020.   Specialty: Family Medicine Why: '@10'$ :30am Contact information: [place] [address] [phone]                 Hospital Course by problem list: #Acute hypoxic respiratory failure #ESRD on HD T/Th/Sat at [facility]  #CAP  On admission, patient endorsed new onset SOB in the setting of 2 missed HD sessions over the past 2 weeks. She endorsed a similar episode 6 months ago after missing an HD session.  She was admitted for management of hypervolemia (edema, BNP 825), and hyperkalemia (6.3)-she received treatment with Lokelma, insulin with dextrose, and IV calcium; urgent HD occurred hours within presentation (10/1) to the ED. She had relief of her symptoms and improvement of her volume status. Initial CXR in the ED showed scattered infiltrates in the right suprahilar, left perihilar, and  left basilar regions. Presentation initially thought to be from volume overload state in the setting of missing 2 recent HD sessions. Overnight, patient had a new fever to 101.7, and this resolved with Tylenol. At that time, repeat chest x-ray showed stable infiltrates, and she started on ceftriaxone and azithromycin for CAP. The following day, patient continued to be afebrile with downtrending WBC. Acute hypoxic respiratory failure likely multifactorial for the aforementioned reasons.  Patient weaned from 4 L to room air over the last few days of her hospitalization, and she was satting well on room air when she was discharged. Discharged on cefdinir/doxycycline oral regimens to finish out treatment for her CAP.  #Lupus Diagnosed in 2000. Was on steroids in the past, per review of records may have been on plaquenil in the past though unclear after reviewing records. She is not currently on any medications for her lupus.  #Anemia Pt has a history of IDA and anemia of chronic disease. Hgb of 6.7 on October 2, received 2 units PRBCs. Hemoglobin is otherwise stable throughout the rest of her hospital stay.   Patient's home medications were continued for the rest of her chronic conditions.  Discharge Exam:   BP 139/88 (BP Location: Right Arm)   Pulse 87   Temp 99.6 F (37.6 C) (Oral)   Resp 18   Ht '5\' 4"'$  (1.626 m)   Wt 81.7 kg   SpO2 98%   BMI 30.92 kg/m  Discharge exam:  General: NAD, nl appearance HE: Normocephalic, atraumatic , EOMI, Conjunctivae normal ENT: No congestion, no rhinorrhea, no exudate or erythema  Cardiovascular: Tachycardia, regular rhythm. No murmurs, rubs, or gallops Pulmonary : Effort normal, breath sounds normal. Some wheezing on forced expiration Abdominal: soft, nontender, nondistended Musculoskeletal: Trace bilateral pitting edema, no deformity, injury,or tenderness in extremities, Skin: Warm, dry , no bruising, erythema, or rash Psychiatric/Behavioral:  normal  mood, normal behavior   Pertinent Labs, Studies, and Procedures:  CBC Latest Ref Rng & Units 12/07/2020 12/06/2020 12/06/2020  WBC 4.0 - 10.5 K/uL 9.4 8.8 -  Hemoglobin 12.0 - 15.0 g/dL 7.4(L) 7.5(L) 7.1(L)  Hematocrit 36.0 - 46.0 % 21.6(L) 21.5(L) 21.2(L)  Platelets 150 - 400 K/uL 179 161 -    BMP Latest Ref Rng & Units 12/07/2020 12/06/2020 12/05/2020  Glucose 70 - 99 mg/dL 105(H) 113(H) 188(H)  BUN 6 - 20 mg/dL 58(H) 39(H) 30(H)  Creatinine 0.44 - 1.00 mg/dL 9.79(H) 7.53(H) 6.43(H)  Sodium 135 - 145 mmol/L 137 138 137  Potassium 3.5 - 5.1 mmol/L 5.0 4.8 4.2  Chloride 98 - 111 mmol/L 97(L) 97(L) 94(L)  CO2 22 - 32 mmol/L 21(L) 25 26  Calcium 8.9 - 10.3 mg/dL 8.1(L) 8.0(L) 8.3(L)     DG CHEST PORT 1 VIEW  Result Date: 12/06/2020 CLINICAL DATA:  Oxygen desaturation EXAM: PORTABLE CHEST 1 VIEW COMPARISON:  From the previous day. FINDINGS: Cardiac shadow is stable but enlarged. Aortic calcifications are again noted. Patchy airspace  disease is again identified bilaterally and stable. No bony abnormality is noted. IMPRESSION: Stable bilateral multifocal infiltrates. Electronically Signed   By: Inez Catalina M.D.   On: 12/06/2020 00:36   DG Chest Port 1 View  Result Date: 12/05/2020 CLINICAL DATA:  Shortness of breath. EXAM: PORTABLE CHEST 1 VIEW COMPARISON:  November 04, 2020 FINDINGS: Moderate to marked severity right suprahilar, left perihilar and left basilar infiltrates are seen. There is mild elevation of the right hemidiaphragm. No pleural effusion or pneumothorax is identified. The heart size and mediastinal contours are within normal limits. There is moderate severity calcification of the aortic arch. The visualized skeletal structures are unremarkable. IMPRESSION: Moderate to marked severity right suprahilar, left perihilar and left basilar infiltrates. Electronically Signed   By: Virgina Norfolk M.D.   On: 12/05/2020 03:27     Discharge Instructions: Discharge Instructions     Call MD  for:  difficulty breathing, headache or visual disturbances   Complete by: As directed    No wound care   Complete by: As directed       Signed: Orvis Brill, MD 12/07/2020, 3:34 PM   Pager: [phone]

## 2021-03-14 ENCOUNTER — Other Ambulatory Visit: Payer: Self-pay

## 2021-03-14 ENCOUNTER — Inpatient Hospital Stay (HOSPITAL_COMMUNITY)
Admission: EM | Admit: 2021-03-14 | Discharge: 2021-03-15 | DRG: 286 | Disposition: A | Discharge status: Home / Self Care | Payer: Medicare Other | Attending: Internal Medicine | Admitting: Internal Medicine

## 2021-03-14 ENCOUNTER — Emergency Department (HOSPITAL_COMMUNITY): Payer: Medicare Other

## 2021-03-14 ENCOUNTER — Encounter (HOSPITAL_COMMUNITY): Payer: Self-pay | Admitting: Cardiovascular Disease

## 2021-03-14 ENCOUNTER — Inpatient Hospital Stay (HOSPITAL_COMMUNITY)
Admission: EM | Disposition: A | Discharge status: Home / Self Care | Payer: Self-pay | Source: Home / Self Care | Attending: Cardiovascular Disease

## 2021-03-14 DIAGNOSIS — E785 Hyperlipidemia, unspecified: Secondary | ICD-10-CM | POA: Diagnosis present

## 2021-03-14 DIAGNOSIS — Z79899 Other long term (current) drug therapy: Secondary | ICD-10-CM

## 2021-03-14 DIAGNOSIS — R9431 Abnormal electrocardiogram [ECG] [EKG]: Secondary | ICD-10-CM | POA: Diagnosis present

## 2021-03-14 DIAGNOSIS — I519 Heart disease, unspecified: Secondary | ICD-10-CM | POA: Diagnosis present

## 2021-03-14 DIAGNOSIS — I213 ST elevation (STEMI) myocardial infarction of unspecified site: Secondary | ICD-10-CM | POA: Diagnosis present

## 2021-03-14 DIAGNOSIS — R7303 Prediabetes: Secondary | ICD-10-CM | POA: Diagnosis present

## 2021-03-14 DIAGNOSIS — I120 Hypertensive chronic kidney disease with stage 5 chronic kidney disease or end stage renal disease: Secondary | ICD-10-CM | POA: Diagnosis present

## 2021-03-14 DIAGNOSIS — R55 Syncope and collapse: Secondary | ICD-10-CM | POA: Diagnosis present

## 2021-03-14 DIAGNOSIS — M3214 Glomerular disease in systemic lupus erythematosus: Secondary | ICD-10-CM | POA: Diagnosis present

## 2021-03-14 DIAGNOSIS — N2581 Secondary hyperparathyroidism of renal origin: Secondary | ICD-10-CM | POA: Diagnosis present

## 2021-03-14 DIAGNOSIS — G2581 Restless legs syndrome: Secondary | ICD-10-CM | POA: Diagnosis present

## 2021-03-14 DIAGNOSIS — F17210 Nicotine dependence, cigarettes, uncomplicated: Secondary | ICD-10-CM | POA: Diagnosis present

## 2021-03-14 DIAGNOSIS — I2510 Atherosclerotic heart disease of native coronary artery without angina pectoris: Secondary | ICD-10-CM | POA: Diagnosis present

## 2021-03-14 DIAGNOSIS — Z20822 Contact with and (suspected) exposure to covid-19: Secondary | ICD-10-CM | POA: Diagnosis present

## 2021-03-14 DIAGNOSIS — I959 Hypotension, unspecified: Secondary | ICD-10-CM | POA: Diagnosis present

## 2021-03-14 DIAGNOSIS — D631 Anemia in chronic kidney disease: Secondary | ICD-10-CM | POA: Diagnosis present

## 2021-03-14 DIAGNOSIS — N186 End stage renal disease: Secondary | ICD-10-CM | POA: Diagnosis present

## 2021-03-14 DIAGNOSIS — F329 Major depressive disorder, single episode, unspecified: Secondary | ICD-10-CM | POA: Diagnosis present

## 2021-03-14 DIAGNOSIS — M329 Systemic lupus erythematosus, unspecified: Secondary | ICD-10-CM | POA: Diagnosis present

## 2021-03-14 DIAGNOSIS — R008 Other abnormalities of heart beat: Secondary | ICD-10-CM | POA: Diagnosis not present

## 2021-03-14 DIAGNOSIS — Z992 Dependence on renal dialysis: Secondary | ICD-10-CM | POA: Diagnosis not present

## 2021-03-14 DIAGNOSIS — D509 Iron deficiency anemia, unspecified: Secondary | ICD-10-CM | POA: Diagnosis present

## 2021-03-14 DIAGNOSIS — E869 Volume depletion, unspecified: Secondary | ICD-10-CM | POA: Diagnosis present

## 2021-03-14 DIAGNOSIS — I10 Essential (primary) hypertension: Secondary | ICD-10-CM | POA: Diagnosis present

## 2021-03-14 DIAGNOSIS — R531 Weakness: Secondary | ICD-10-CM

## 2021-03-14 HISTORY — PX: CORONARY/GRAFT ACUTE MI REVASCULARIZATION: CATH118305

## 2021-03-14 HISTORY — PX: LEFT HEART CATH AND CORONARY ANGIOGRAPHY: CATH118249

## 2021-03-14 LAB — CBC WITH DIFFERENTIAL/PLATELET
Abs Immature Granulocytes: 0.02 10*3/uL (ref 0.00–0.07)
Basophils Absolute: 0 10*3/uL (ref 0.0–0.1)
Basophils Relative: 1 %
Eosinophils Absolute: 0.1 10*3/uL (ref 0.0–0.5)
Eosinophils Relative: 2 %
HCT: 32.9 % — ABNORMAL LOW (ref 36.0–46.0)
Hemoglobin: 11.1 g/dL — ABNORMAL LOW (ref 12.0–15.0)
Immature Granulocytes: 0 %
Lymphocytes Relative: 28 %
Lymphs Abs: 1.6 10*3/uL (ref 0.7–4.0)
MCH: 27.1 pg (ref 26.0–34.0)
MCHC: 33.7 g/dL (ref 30.0–36.0)
MCV: 80.2 fL (ref 80.0–100.0)
Monocytes Absolute: 0.6 10*3/uL (ref 0.1–1.0)
Monocytes Relative: 10 %
Neutro Abs: 3.3 10*3/uL (ref 1.7–7.7)
Neutrophils Relative %: 59 %
Platelets: 192 10*3/uL (ref 150–400)
RBC: 4.1 MIL/uL (ref 3.87–5.11)
RDW: 15.8 % — ABNORMAL HIGH (ref 11.5–15.5)
WBC: 5.6 10*3/uL (ref 4.0–10.5)
nRBC: 0 % (ref 0.0–0.2)

## 2021-03-14 LAB — COMPREHENSIVE METABOLIC PANEL
ALT: 12 U/L (ref 0–44)
AST: 18 U/L (ref 15–41)
Albumin: 3.7 g/dL (ref 3.5–5.0)
Alkaline Phosphatase: 77 U/L (ref 38–126)
Anion gap: 17 — ABNORMAL HIGH (ref 5–15)
BUN: 29 mg/dL — ABNORMAL HIGH (ref 6–20)
CO2: 25 mmol/L (ref 22–32)
Calcium: 9.7 mg/dL (ref 8.9–10.3)
Chloride: 90 mmol/L — ABNORMAL LOW (ref 98–111)
Creatinine, Ser: 8.46 mg/dL — ABNORMAL HIGH (ref 0.44–1.00)
GFR, Estimated: 6 mL/min — ABNORMAL LOW (ref >60)
Glucose, Bld: 85 mg/dL (ref 70–99)
Potassium: 5.4 mmol/L — ABNORMAL HIGH (ref 3.5–5.1)
Sodium: 132 mmol/L — ABNORMAL LOW (ref 135–145)
Total Bilirubin: 0.6 mg/dL (ref 0.3–1.2)
Total Protein: 7 g/dL (ref 6.5–8.1)

## 2021-03-14 LAB — SEDIMENTATION RATE: Sed Rate: 46 mm/hr — ABNORMAL HIGH (ref 0–22)

## 2021-03-14 LAB — TROPONIN I (HIGH SENSITIVITY)
Troponin I (High Sensitivity): 10 ng/L (ref <18)
Troponin I (High Sensitivity): 21 ng/L — ABNORMAL HIGH (ref <18)

## 2021-03-14 LAB — I-STAT BETA HCG BLOOD, ED (NOT ORDERABLE): I-stat hCG, quantitative: <5 m[IU]/mL (ref <5)

## 2021-03-14 LAB — RESP PANEL BY RT-PCR (FLU A&B, COVID) ARPGX2
Influenza A by PCR: NEGATIVE
Influenza B by PCR: NEGATIVE
SARS Coronavirus 2 by RT PCR: NEGATIVE

## 2021-03-14 LAB — LIPID PANEL
Cholesterol: 207 mg/dL — ABNORMAL HIGH (ref 0–200)
HDL: 42 mg/dL (ref >40)
LDL Cholesterol: 138 mg/dL — ABNORMAL HIGH (ref 0–99)
Total CHOL/HDL Ratio: 4.9 RATIO
Triglycerides: 135 mg/dL (ref <150)
VLDL: 27 mg/dL (ref 0–40)

## 2021-03-14 LAB — POCT I-STAT, CHEM 8
BUN: 29 mg/dL — ABNORMAL HIGH (ref 6–20)
Calcium, Ion: 1.17 mmol/L (ref 1.15–1.40)
Chloride: 93 mmol/L — ABNORMAL LOW (ref 98–111)
Creatinine, Ser: 8.1 mg/dL — ABNORMAL HIGH (ref 0.44–1.00)
Glucose, Bld: 113 mg/dL — ABNORMAL HIGH (ref 70–99)
HCT: 31 % — ABNORMAL LOW (ref 36.0–46.0)
Hemoglobin: 10.5 g/dL — ABNORMAL LOW (ref 12.0–15.0)
Potassium: 4.6 mmol/L (ref 3.5–5.1)
Sodium: 131 mmol/L — ABNORMAL LOW (ref 135–145)
TCO2: 27 mmol/L (ref 22–32)

## 2021-03-14 LAB — HEMOGLOBIN A1C
Hgb A1c MFr Bld: 5.9 % — ABNORMAL HIGH (ref 4.8–5.6)
Mean Plasma Glucose: 122.63 mg/dL

## 2021-03-14 LAB — POCT ACTIVATED CLOTTING TIME
Activated Clotting Time: 239 seconds
Activated Clotting Time: 197 seconds
Activated Clotting Time: 173 seconds

## 2021-03-14 LAB — TSH: TSH: 0.512 u[IU]/mL (ref 0.350–4.500)

## 2021-03-14 LAB — PROTIME-INR
INR: 1 (ref 0.8–1.2)
Prothrombin Time: 13.5 seconds (ref 11.4–15.2)

## 2021-03-14 LAB — VITAMIN B12: Vitamin B-12: 914 pg/mL (ref 180–914)

## 2021-03-14 LAB — MRSA NEXT GEN BY PCR, NASAL: MRSA by PCR Next Gen: NOT DETECTED

## 2021-03-14 LAB — APTT: aPTT: 68 seconds — ABNORMAL HIGH (ref 24–36)

## 2021-03-14 LAB — C-REACTIVE PROTEIN: CRP: 0.6 mg/dL (ref <1.0)

## 2021-03-14 LAB — BRAIN NATRIURETIC PEPTIDE: B Natriuretic Peptide: 15.8 pg/mL (ref 0.0–100.0)

## 2021-03-14 LAB — D-DIMER, QUANTITATIVE: D-Dimer, Quant: 0.54 ug/mL-FEU — ABNORMAL HIGH (ref 0.00–0.50)

## 2021-03-14 SURGERY — CORONARY/GRAFT ACUTE MI REVASCULARIZATION
Anesthesia: LOCAL

## 2021-03-14 MED ORDER — SODIUM CHLORIDE 0.9 % IV BOLUS
500.0000 mL | Freq: Once | INTRAVENOUS | Status: AC
  Administered 2021-03-14: 500 mL via INTRAVENOUS

## 2021-03-14 MED ORDER — MIDAZOLAM HCL 2 MG/2ML IJ SOLN
INTRAMUSCULAR | Status: AC
  Filled 2021-03-14: qty 2

## 2021-03-14 MED ORDER — LIDOCAINE HCL (PF) 1 % IJ SOLN
INTRAMUSCULAR | Status: DC | PRN
  Administered 2021-03-14: 10 mL

## 2021-03-14 MED ORDER — SODIUM CHLORIDE 0.9% FLUSH: 3.0000 mL | INTRAVENOUS | Status: DC | PRN

## 2021-03-14 MED ORDER — HEPARIN (PORCINE) IN NACL 1000-0.9 UT/500ML-% IV SOLN
INTRAVENOUS | Status: DC | PRN
  Administered 2021-03-14 (×3): 500 mL

## 2021-03-14 MED ORDER — LABETALOL HCL 5 MG/ML IV SOLN: 10.0000 mg | INTRAVENOUS | Status: AC | PRN

## 2021-03-14 MED ORDER — IOHEXOL 350 MG/ML SOLN
INTRAVENOUS | Status: DC | PRN
  Administered 2021-03-14: 75 mL

## 2021-03-14 MED ORDER — HEPARIN (PORCINE) IN NACL 1000-0.9 UT/500ML-% IV SOLN
INTRAVENOUS | Status: AC
  Filled 2021-03-14: qty 1500

## 2021-03-14 MED ORDER — MIDAZOLAM HCL 2 MG/2ML IJ SOLN
INTRAMUSCULAR | Status: DC | PRN
  Administered 2021-03-14: 2 mg via INTRAVENOUS

## 2021-03-14 MED ORDER — ASPIRIN 81 MG PO CHEW
81.0000 mg | CHEWABLE_TABLET | Freq: Every day | ORAL | Status: DC
  Administered 2021-03-15: 81 mg via ORAL
  Filled 2021-03-14: qty 1

## 2021-03-14 MED ORDER — SODIUM CHLORIDE 0.9 % IV SOLN: 250.0000 mL | INTRAVENOUS | Status: DC | PRN

## 2021-03-14 MED ORDER — LIDOCAINE HCL (PF) 1 % IJ SOLN
INTRAMUSCULAR | Status: AC
  Filled 2021-03-14: qty 30

## 2021-03-14 MED ORDER — FENTANYL CITRATE (PF) 100 MCG/2ML IJ SOLN
INTRAMUSCULAR | Status: AC
  Filled 2021-03-14: qty 2

## 2021-03-14 MED ORDER — ONDANSETRON HCL 4 MG/2ML IJ SOLN: 4.0000 mg | Freq: Four times a day (QID) | INTRAMUSCULAR | Status: DC | PRN

## 2021-03-14 MED ORDER — SODIUM CHLORIDE 0.9% FLUSH
3.0000 mL | Freq: Two times a day (BID) | INTRAVENOUS | Status: DC
  Administered 2021-03-15: 3 mL via INTRAVENOUS

## 2021-03-14 MED ORDER — HYDRALAZINE HCL 20 MG/ML IJ SOLN: 10.0000 mg | INTRAMUSCULAR | Status: AC | PRN

## 2021-03-14 MED ORDER — ACETAMINOPHEN 325 MG PO TABS: 650.0000 mg | ORAL_TABLET | ORAL | Status: DC | PRN

## 2021-03-14 MED ORDER — VERAPAMIL HCL 2.5 MG/ML IV SOLN
INTRAVENOUS | Status: AC
  Filled 2021-03-14: qty 2

## 2021-03-14 MED ORDER — HEPARIN SODIUM (PORCINE) 5000 UNIT/ML IJ SOLN
4000.0000 [IU] | Freq: Once | INTRAMUSCULAR | Status: AC
  Administered 2021-03-14: 4000 [IU] via INTRAVENOUS

## 2021-03-14 MED ORDER — FENTANYL CITRATE (PF) 100 MCG/2ML IJ SOLN
INTRAMUSCULAR | Status: DC | PRN
  Administered 2021-03-14: 25 ug via INTRAVENOUS

## 2021-03-14 MED ORDER — SODIUM CHLORIDE 0.9 % IV SOLN: INTRAVENOUS | Status: AC

## 2021-03-14 SURGICAL SUPPLY — 11 items
CATH INFINITI 5FR MULTPACK ANG (CATHETERS) ×1 IMPLANT
GLIDESHEATH SLEND SS 6F .021 (SHEATH) ×1 IMPLANT
GUIDEWIRE INQWIRE 1.5J.035X260 (WIRE) IMPLANT
INQWIRE 1.5J .035X260CM (WIRE) ×2
KIT ENCORE 26 ADVANTAGE (KITS) ×1 IMPLANT
KIT HEART LEFT (KITS) ×2 IMPLANT
PACK CARDIAC CATHETERIZATION (CUSTOM PROCEDURE TRAY) ×2 IMPLANT
SHEATH PINNACLE 6F 10CM (SHEATH) ×1 IMPLANT
TRANSDUCER W/STOPCOCK (MISCELLANEOUS) ×2 IMPLANT
TUBING CIL FLEX 10 FLL-RA (TUBING) ×2 IMPLANT
WIRE EMERALD 3MM-J .035X150CM (WIRE) ×1 IMPLANT

## 2021-03-14 NOTE — Assessment & Plan Note (Addendum)
41 year old presenting with weakness, fatigue, dizziness and collapsing found to have STEMI. LHC with mild non obstructing CAD -has elliptical mobile density which seems to move with movement of the heart. -echo -? Pericarditis -check inflammatory markers  -troponin pending -covid negative  -plan per cardiology

## 2021-03-14 NOTE — Assessment & Plan Note (Addendum)
Large diffential LHC with mild nonobstructive disease that doesn't account for ST elevation ? Pericarditis, echo pending. Check inflammatory markers Will check TSH, B12 Orthostatics when off bed rest and able  Continue to follow clinically

## 2021-03-14 NOTE — H&P (Signed)
Cardiology Admission History and Physical:   Patient ID: [place] Droll MRN: [id number]; DOB: 09/01/1980   Admission date: 03/14/2021  PCP:  Katherina Mires, MD   [facility] Providers Cardiologist:  Shelva Majestic, MD new  Chief Complaint:  STEMI  Patient Profile:   Latasha Drake is a 41 y.o. female with lupus, ESRD on HD, HTN, lupus who is being seen 03/14/2021 for the evaluation of STEMI.  History of Present Illness:   Ms. Ector was BIB EMS after she had generalized weakness and fall. She did not lose consciousness.   She denied chest pain but was hypotensive She received IVF in the ER. EKG concerning for ST elevation in her inferior and lateral leads. CODE STEMI was activated. She states her weakness was not consistent with a lupus flare.   She was hospitalized in Oct 2022 with shortness of breath after missing 2 HD sessions found to have PNA. PNA was treated with ABX. She apparently was not taking any medications for her lupus at that time.   PTA, appears she was maintained on 10 mg amlodipine, 0.1 mg TID, clonidine, and hydralazine 100 mg TID.    Appears she is being worked up at [facility] for kidney transplant.  Past Medical History:  Diagnosis Date   Anemia    Chronic kidney disease    Hypertension    Lupus ([place])    Pericardial effusion 04/13/2019   circumferential   Pneumonia 04/12/2019   Pulmonary hypertension ([place]) 04/13/2019   moderate    Past Surgical History:  Procedure Laterality Date   CESAREAN SECTION     HERNIA REPAIR     IR FLUORO GUIDE CV LINE RIGHT  04/19/2019   IR FLUORO GUIDE CV LINE RIGHT  04/22/2019   IR US GUIDE VASC ACCESS RIGHT  04/19/2019   IR US GUIDE VASC ACCESS RIGHT  04/22/2019     Medications Prior to Admission: Prior to Admission medications   Medication Sig Start Date End Date Taking? Authorizing Provider  amLODipine (NORVASC) 10 MG tablet Take 10 mg by mouth daily. 09/29/20   [provider]  cefdinir (OMNICEF) 300 MG capsule Take 1  capsule (300 mg total) by mouth Every Tuesday,Thursday,and Saturday with dialysis. 12/08/20   Orvis Brill, MD  citalopram (CELEXA) 20 MG tablet Take 20 mg by mouth daily. 11/28/20   [provider]  cloNIDine (CATAPRES) 0.1 MG tablet Take 0.1 mg by mouth 3 (three) times daily. 09/29/20   [provider]  cyclobenzaprine (FLEXERIL) 10 MG tablet Take 10 mg by mouth 3 (three) times daily as needed for muscle spasms.  04/03/19   [provider]  doxycycline (VIBRAMYCIN) 100 MG capsule Take 1 capsule (100 mg total) by mouth 2 (two) times daily. 12/07/20   Orvis Brill, MD  hydrALAZINE (APRESOLINE) 100 MG tablet Take 1 tablet (100 mg total) by mouth 3 (three) times daily. 04/26/19   Mikhail, Velta Addison, DO  labetalol (NORMODYNE) 300 MG tablet Take 300 mg by mouth 2 (two) times daily.    [provider]  methocarbamol (ROBAXIN) 750 MG tablet Take 750 mg by mouth 3 (three) times daily. 11/23/20   [provider]  pantoprazole (PROTONIX) 40 MG tablet Take 40 mg by mouth daily as needed (indigestion).  10/27/16   [provider]  paragard intrauterine copper IUD IUD 1 Intra Uterine Device by Intrauterine route once.    [provider]  pregabalin (LYRICA) 150 MG capsule Take 150 mg by mouth 2 (two) times  daily. 09/16/20   [provider]  rOPINIRole (REQUIP) 2 MG tablet Take 2 mg by mouth at bedtime. 10/01/20   [provider]  sevelamer carbonate (RENVELA) 800 MG tablet Take 1,600 mg by mouth 3 (three) times daily with meals. 08/26/20   [provider]     Allergies:    Allergies  Allergen Reactions   Baclofen Other (See Comments)    Tremors   Lisinopril Swelling   Ed-Apap [Acetaminophen]     Social History:   Social History   Socioeconomic History   Marital status: Divorced    Spouse name: Not on file   Number of children: Not on file   Years of education: Not on file   Highest education level: Not on  file  Occupational History   Not on file  Tobacco Use   Smoking status: Every Day    Packs/day: 0.50    Types: Cigarettes   Smokeless tobacco: Never  Substance and Sexual Activity   Alcohol use: Yes    Comment: occassionally   Drug use: No   Sexual activity: Not on file  Other Topics Concern   Not on file  Social History Narrative   Not on file   Social Determinants of Health   Financial Resource Strain: Not on file  Food Insecurity: Not on file  Transportation Needs: Not on file  Physical Activity: Not on file  Stress: Not on file  Social Connections: Not on file  Intimate Partner Violence: Not on file    Family History:   The patient's family history includes Lupus in an other family member.    ROS:  Please see the history of present illness.  All other ROS reviewed and negative.     Physical Exam/Data:   Vitals:   03/14/21 1336 03/14/21 1341 03/14/21 1346 03/14/21 1350  BP: (!) 116/53 (!) 111/55 (!) 115/57 (!) 99/59  Pulse: 72 73 71 70  Resp: 20 12 18 18   Temp:      TempSrc:      SpO2: 99% 97% 97% 100%    Intake/Output Summary (Last 24 hours) at 03/14/2021 1405 Last data filed at 03/14/2021 1308 Gross per 24 hour  Intake 500 ml  Output --  Net 500 ml   Last 3 Weights 12/07/2020 12/06/2020 12/05/2020  Weight (lbs) 180 lb 1.9 oz 179 lb 14.3 oz 180 lb 8.9 oz  Weight (kg) 81.7 kg 81.6 kg 81.9 kg     There is no height or weight on file to calculate BMI.  General:  Well nourished, well developed, in no acute distress HEENT: normal Neck: no JVD Vascular: No carotid bruits; Distal pulses 2+ bilaterally   Cardiac:  normal S1, S2; RRR; no murmur  Lungs:  clear to auscultation bilaterally, no wheezing, rhonchi or rales  Abd: soft, nontender, no hepatomegaly  Ext: no edema Musculoskeletal:  No deformities, BUE and BLE strength normal and equal Skin: warm and dry  Neuro:  CNs 2-12 intact, no focal abnormalities noted Psych:  Normal affect    EKG:  The ECG that  was done  was personally reviewed and demonstrates sinus rhythm HR 72 inferior-lateral ST elevation  Relevant CV Studies:  Cath - report pending  Echo - pending   Laboratory Data:  High Sensitivity Troponin:  No results for input(s): TROPONINIHS in the last 720 hours.    ChemistryNo results for input(s): NA, K, CL, CO2, GLUCOSE, BUN, CREATININE, CALCIUM, MG, GFRNONAA, GFRAA, ANIONGAP in the last 168 hours.  No results for input(s): PROT, ALBUMIN, AST, ALT, ALKPHOS, BILITOT in the last 168 hours. Lipids No results for input(s): CHOL, TRIG, HDL, LABVLDL, LDLCALC, CHOLHDL in the last 168 hours. Hematology Recent Labs  Lab 03/14/21 1247  WBC 5.6  RBC 4.10  HGB 11.1*  HCT 32.9*  MCV 80.2  MCH 27.1  MCHC 33.7  RDW 15.8*  PLT 192   Thyroid No results for input(s): TSH, FREET4 in the last 168 hours. BNPNo results for input(s): BNP, PROBNP in the last 168 hours.  DDimer No results for input(s): DDIMER in the last 168 hours.   Radiology/Studies:  DG Chest Port 1 View  Result Date: 03/14/2021 CLINICAL DATA:  Fall.  Generalized weakness. EXAM: PORTABLE CHEST 1 VIEW COMPARISON:  12/06/2020 and older exams. FINDINGS: Cardiac silhouette is normal in size. No mediastinal or hilar masses. Mild linear opacity above and elevated right hemidiaphragm consistent with atelectasis. Lungs otherwise clear. No convincing pleural effusion and no pneumothorax. Skeletal structures are grossly intact IMPRESSION: No active disease. Electronically Signed   By: Lajean Manes M.D.   On: 03/14/2021 13:19     Assessment and Plan:   Concern for STEMI after a fall - pt was taken emergently to the cath lab for angiography but was found to have clean coronaries - will obtain echo   ESRD on HD Hypertension - will consult nephrology - HD on T-TH-Sat - she went to HD yesterday   Lupus - have consulted medicine service    Risk Assessment/Risk Scores:       Severity of Illness: The appropriate  patient status for this patient is INPATIENT. Inpatient status is judged to be reasonable and necessary in order to provide the required intensity of service to ensure the patient's safety. The patient's presenting symptoms, physical exam findings, and initial radiographic and laboratory data in the context of their chronic comorbidities is felt to place them at high risk for further clinical deterioration. Furthermore, it is not anticipated that the patient will be medically stable for discharge from the hospital within 2 midnights of admission.   * I certify that at the point of admission it is my clinical judgment that the patient will require inpatient hospital care spanning beyond 2 midnights from the point of admission due to high intensity of service, high risk for further deterioration and high frequency of surveillance required.*   For questions or updates, please contact [place] Please consult [email or web address] for contact info under     Signed, Ledora Bottcher, PA  03/14/2021 2:05 PM

## 2021-03-14 NOTE — ED Provider Notes (Signed)
[facility] EMERGENCY DEPARTMENT Provider Note   CSN: [id number] Arrival date & time: 03/14/21  1239     History  Chief Complaint  Patient presents with   Code STEMI    Latasha Drake is a 41 y.o. female with history of end-stage renal disease, on dialysis Tuesday Thursday Saturday, history of lupus, history hypertension, presenting to emergency department with generalized fatigue.  Patient's husband called EMS after the patient reportedly fell from generalized weakness this afternoon.  She reports she suddenly felt extremely weak in her arms and legs.  She has never felt this way before.  She denies any chest pain or pressure.  She reports even on arrival she continues to feel exhausted.  She denies headache, nausea, vomiting, fevers or chills.  She said this does not feel like lupus flareup.  She denies that she takes prednisone for lupus.  She denies any coronary history or history of heart attack or seeing cardiologist.  The patient did have an echocardiogram in February 2021 per my review of her medical records, showing an EF of 60 to 65%, no left ventricular wall motion abnormalities, normal right ventricle, left atrial size moderately dilated, and a small pericardial effusion  HPI     Home Medications Prior to Admission medications   Medication Sig Start Date End Date Taking? Authorizing Provider  amLODipine (NORVASC) 10 MG tablet Take 10 mg by mouth daily. 09/29/20   [provider]  cefdinir (OMNICEF) 300 MG capsule Take 1 capsule (300 mg total) by mouth Every Tuesday,Thursday,and Saturday with dialysis. 12/08/20   Orvis Brill, MD  citalopram (CELEXA) 20 MG tablet Take 20 mg by mouth daily. 11/28/20   [provider]  cloNIDine (CATAPRES) 0.1 MG tablet Take 0.1 mg by mouth 3 (three) times daily. 09/29/20   [provider]  cyclobenzaprine (FLEXERIL) 10 MG tablet Take 10 mg by mouth 3 (three) times daily as needed for muscle spasms.   04/03/19   [provider]  doxycycline (VIBRAMYCIN) 100 MG capsule Take 1 capsule (100 mg total) by mouth 2 (two) times daily. 12/07/20   Orvis Brill, MD  hydrALAZINE (APRESOLINE) 100 MG tablet Take 1 tablet (100 mg total) by mouth 3 (three) times daily. 04/26/19   Mikhail, Velta Addison, DO  labetalol (NORMODYNE) 300 MG tablet Take 300 mg by mouth 2 (two) times daily.    [provider]  methocarbamol (ROBAXIN) 750 MG tablet Take 750 mg by mouth 3 (three) times daily. 11/23/20   [provider]  pantoprazole (PROTONIX) 40 MG tablet Take 40 mg by mouth daily as needed (indigestion).  10/27/16   [provider]  paragard intrauterine copper IUD IUD 1 Intra Uterine Device by Intrauterine route once.    [provider]  pregabalin (LYRICA) 150 MG capsule Take 150 mg by mouth 2 (two) times daily. 09/16/20   [provider]  rOPINIRole (REQUIP) 2 MG tablet Take 2 mg by mouth at bedtime. 10/01/20   [provider]  sevelamer carbonate (RENVELA) 800 MG tablet Take 1,600 mg by mouth 3 (three) times daily with meals. 08/26/20   [provider]      Allergies    Baclofen and Lisinopril    Review of Systems   Review of Systems  Constitutional:  Negative for chills and fever.  Respiratory:  Negative for cough and shortness of breath.   Cardiovascular:  Negative for chest pain and palpitations.  Gastrointestinal:  Positive for nausea. Negative for abdominal pain  and vomiting.  Genitourinary:  Negative for dysuria and hematuria.  Musculoskeletal:  Negative for arthralgias and back pain.  Skin:  Negative for color change and rash.  Neurological:  Positive for light-headedness. Negative for syncope.   Physical Exam Updated Vital Signs SpO2 98%  Physical Exam Constitutional:      General: She is not in acute distress.    Comments: Pale, tired  HENT:     Head: Normocephalic and atraumatic.  Eyes:     Conjunctiva/sclera:  Conjunctivae normal.     Pupils: Pupils are equal, round, and reactive to light.  Cardiovascular:     Rate and Rhythm: Normal rate and regular rhythm.     Pulses: Normal pulses.  Pulmonary:     Effort: Pulmonary effort is normal. No respiratory distress.  Abdominal:     General: There is no distension.     Tenderness: There is no abdominal tenderness.  Skin:    General: Skin is warm and dry.  Neurological:     General: No focal deficit present.     Mental Status: She is alert. Mental status is at baseline.  Psychiatric:        Mood and Affect: Mood normal.        Behavior: Behavior normal.    ED Results / Procedures / Treatments   Labs (all labs ordered are listed, but only abnormal results are displayed) Labs Reviewed - No data to display  EKG None  Radiology No results found.  Procedures .Critical Care Performed by: Wyvonnia Dusky, MD Authorized by: Wyvonnia Dusky, MD   Critical care provider statement:    Critical care time (minutes):  40   Critical care time was exclusive of:  Separately billable procedures and treating other patients   Critical care was necessary to treat or prevent imminent or life-threatening deterioration of the following conditions:  Circulatory failure   Critical care was time spent personally by me on the following activities:  Ordering and performing treatments and interventions, ordering and review of laboratory studies, ordering and review of radiographic studies, pulse oximetry, review of old charts, examination of patient and evaluation of patient's response to treatment   Care discussed with: admitting provider   Comments:     Hypotension, code stemi, IV heparin, cards consult Ultrasound ED Echo  Date/Time: 03/14/2021 4:53 PM Performed by: Wyvonnia Dusky, MD Authorized by: Wyvonnia Dusky, MD   Procedure details:    Indications: hypotension     Views: parasternal long axis view, parasternal short axis view and apical 4  chamber view     Images: archived   Findings:    Pericardium: no pericardial effusion   Impression:    Impression comment:  No large pericardial effusion, IVC poorly visualized    Medications Ordered in ED Medications - No data to display  ED Course/ Medical Decision Making/ A&P Clinical Course as of 03/14/21 1651  Sun Mar 14, 2021  1304 Repeat ecg showing increased elevation inferior leads, st depression avr, BP now 100/80 after 200 cc fluids.   IV heparin ordered, stemi doctor en route to hospital to evaluate patient [MT]  1309 Cardiologist at bedside, anticipating taking the patient Cath Lab.  Blood pressure appears mildly improved.  Patient updated and informed of plan. [MT]    Clinical Course User Index [MT] Trine Fread, Carola Rhine, MD  Medical Decision Making  This patient presents to the ED with concern for hypotension, fatigue. This involves an extensive number of treatment options, and is a complaint that carries with it a high risk of complications and morbidity.  The differential diagnosis includes ACS vs anemia vs infection vs dehydration vs other  Co-morbidities that complicate the patient evaluation: lupus, ESRD, HTN  Additional history obtained from paramedics on arrival  I ordered and personally interpreted labs.  The pertinent results include: tests pending at time of cath lab admission  I ordered imaging studies including dg chest I independently visualized and interpreted imaging which showed no acute processes I agree with the radiologist interpretation  The patient was maintained on a cardiac monitor.  I personally viewed and interpreted the cardiac monitored which showed an underlying rhythm of: NSR  Per my interpretation the patient's ECG shows ST elevations inferior leads, aVR depression  I ordered medication including IV fluid bolus for hypotension, heparin for suspected ACS I have reviewed the patients home medicines and have made  adjustments as needed  Test Considered:  - Given that she was not hypoxia or tachypneic, not in respiratory distress, I have an overall lower suspicion for PE at this time, and felt inferior MI/ACS may be the more likely cause of her symptoms.     I requested consultation with the cardiology  and discussed lab and imaging findings as well as pertinent plan - they recommend: cardiac catheterization   Dispostion:  After consideration of the diagnostic results and the patients response to treatment, I feel that the patent would benefit from hospitalization.        Final Clinical Impression(s) / ED Diagnoses Final diagnoses:  None    Rx / DC Orders ED Discharge Orders     None         Edom Schmuhl, Carola Rhine, MD 03/14/21 1657

## 2021-03-14 NOTE — Assessment & Plan Note (Addendum)
Last completed her full dialysis session yesterday Euvolemic. Has received 8cc will need to monitor volume status closely Nephrology has been consulted, appreciate assistance.

## 2021-03-14 NOTE — Consult Note (Signed)
[facility] Renal Consultation Note    Indication for Consultation:  Management of ESRD/hemodialysis; anemia, hypertension/volume and secondary hyperparathyroidism  TDH:RCBULAG, Jannifer Rodney, MD  HPI: Latasha Drake is a 41 y.o. female. ESRD on HD TTS at [facility].  Past medical history significant for HTN, Restless Leg Syndrome, pulmonary HTN, and lupus nephritis.    Patient seen and examined at bedside in room post cardiac cath.  Still a little groggy from the procedure.  Reports she was in the shed in her back yard when she became light headed, dizziness, weakness and fatigue.  States she went into the house and collapsed and was unable to get up.  Her husband called 74 and she came to the ED.  Denies CP, SOB, abdominal pain, diaphoresis and n/v/d.  States she has never felt like this before.   Denies cardiac history and has never seen a cardiologist.  Reports last dialysis was yesterday.  States she has been complaint and has been tolerating treatments well.  Pertinent findings in work up include negative respiratory panel, CXR with no acute cardiopulmonary disease, K 4.6, BUN 29, SCr 8.1 and EKG with acute inferior infarct.  Patient taken emergently for cardiac catheterization with mild non obstructive CAD without significant focal stenosis and elliptical mobile density which seems to move with movement of the heart.  Being admitted for further evaluation and management.     Past Medical History:  Diagnosis Date   Anemia    Chronic kidney disease    Hypertension    Lupus ([place])    Pericardial effusion 04/13/2019   circumferential   Pneumonia 04/12/2019   Pulmonary hypertension ([place]) 04/13/2019   moderate   Past Surgical History:  Procedure Laterality Date   CESAREAN SECTION     HERNIA REPAIR     IR FLUORO GUIDE CV LINE RIGHT  04/19/2019   IR FLUORO GUIDE CV LINE RIGHT  04/22/2019   IR US GUIDE VASC ACCESS RIGHT  04/19/2019   IR US GUIDE VASC ACCESS RIGHT  04/22/2019   Family  History  Problem Relation Age of Onset   Lupus Other        cousin   Social History:  reports that she has been smoking cigarettes. She has been smoking an average of .5 packs per day. She has never used smokeless tobacco. She reports current alcohol use. She reports that she does not use drugs. Allergies  Allergen Reactions   Baclofen Other (See Comments)    Tremors   Lisinopril Swelling   Ed-Apap [Acetaminophen]    Prior to Admission medications   Medication Sig Start Date End Date Taking? Authorizing Provider  amLODipine (NORVASC) 10 MG tablet Take 10 mg by mouth daily. 09/29/20   [provider]  cefdinir (OMNICEF) 300 MG capsule Take 1 capsule (300 mg total) by mouth Every Tuesday,Thursday,and Saturday with dialysis. 12/08/20   Orvis Brill, MD  citalopram (CELEXA) 20 MG tablet Take 20 mg by mouth daily. 11/28/20   [provider]  cloNIDine (CATAPRES) 0.1 MG tablet Take 0.1 mg by mouth 3 (three) times daily. 09/29/20   [provider]  cyclobenzaprine (FLEXERIL) 10 MG tablet Take 10 mg by mouth 3 (three) times daily as needed for muscle spasms.  04/03/19   [provider]  doxycycline (VIBRAMYCIN) 100 MG capsule Take 1 capsule (100 mg total) by mouth 2 (two) times daily. 12/07/20   Orvis Brill, MD  hydrALAZINE (APRESOLINE) 100 MG tablet Take 1 tablet (100 mg total)  by mouth 3 (three) times daily. 04/26/19   Mikhail, Velta Addison, DO  labetalol (NORMODYNE) 300 MG tablet Take 300 mg by mouth 2 (two) times daily.    [provider]  methocarbamol (ROBAXIN) 750 MG tablet Take 750 mg by mouth 3 (three) times daily. 11/23/20   [provider]  pantoprazole (PROTONIX) 40 MG tablet Take 40 mg by mouth daily as needed (indigestion).  10/27/16   [provider]  paragard intrauterine copper IUD IUD 1 Intra Uterine Device by Intrauterine route once.    [provider]  pregabalin (LYRICA) 150 MG capsule Take 150 mg by  mouth 2 (two) times daily. 09/16/20   [provider]  rOPINIRole (REQUIP) 2 MG tablet Take 2 mg by mouth at bedtime. 10/01/20   [provider]  sevelamer carbonate (RENVELA) 800 MG tablet Take 1,600 mg by mouth 3 (three) times daily with meals. 08/26/20   [provider]   No current facility-administered medications for this encounter.    CBC: Recent Labs  Lab 03/14/21 1247  WBC 5.6  NEUTROABS 3.3  HGB 11.1*  HCT 32.9*  MCV 80.2  PLT 192   Studies/Results: DG Chest Port 1 View  Result Date: 03/14/2021 CLINICAL DATA:  Fall.  Generalized weakness. EXAM: PORTABLE CHEST 1 VIEW COMPARISON:  12/06/2020 and older exams. FINDINGS: Cardiac silhouette is normal in size. No mediastinal or hilar masses. Mild linear opacity above and elevated right hemidiaphragm consistent with atelectasis. Lungs otherwise clear. No convincing pleural effusion and no pneumothorax. Skeletal structures are grossly intact IMPRESSION: No active disease. Electronically Signed   By: Lajean Manes M.D.   On: 03/14/2021 13:19    ROS: All others negative except those listed in HPI.  Physical Exam: Vitals:   03/14/21 1336 03/14/21 1341 03/14/21 1346 03/14/21 1350  BP: (!) 116/53 (!) 111/55 (!) 115/57 (!) 99/59  Pulse: 72 73 71 70  Resp: 20 12 18 18   Temp:      TempSrc:      SpO2: 99% 97% 97% 100%     General: WDWN female in NAD Head: NCAT sclera not icteric MMM Neck: Supple. No lymphadenopathy Lungs: CTA bilaterally. No wheeze, rales or rhonchi. Breathing is unlabored. Heart: RRR. No murmur, rubs or gallops.  Abdomen: soft, nontender, +BS, no guarding, no rebound tenderness  Lower extremities:no edema, ischemic changes, or open wounds  Neuro: lethargic. Moves all extremities spontaneously. Psych:  Responds to questions appropriately with a normal affect. Dialysis Access: LU AVF +b/t  Dialysis Orders:  TTS - [facility]  3.5hrs, BFR 400, DFR 600,  EDW 79kg, 2K/ 2.5Ca  Access: LU AVF   Heparin none Mircera 150 mcg q2wks - last given 12/29/20 Hectorol 58mcg IV qHD   Per outpatient record, hypertensive regimen: amlodipine 10mg  qd, clonidine 0.1mg  TID, furosemide 40mg  qd, hydralazine 100mg  TID, labetalol 300mg  BID   Assessment/Plan:  ST elevation on EKG- cardiac cath with mild non obstructive CAD w/o significant focal stenosis and elliptical mobile density which seems to move with movement of the heart.  Cardiology following.  ?pericarditis. Plan for ECHO.  ESRD -  on HD TTS.  Next HD on 03/16/21.   Hypertension/volume  - Hypotensive initially.  Home meds on hold. OP med list as above.  Cardiology ordered gentle hydration with 564mL bolus.  Patient not getting to outpatient dry weight and may have gained weight as appears euvolemic on exam.  Get standing weights when able.  Does have history of large gains between treatments.  Monitor volume status closely.    Anemia of CKD - Hgb 10.5. No indication for ESA at this time.  Follow labs.  Secondary Hyperparathyroidism -  Check Ca and phos.    Nutrition - Renal diet w/fluid restrictions.  Lupus - no recent flares  Jen Mow, PA-C [facility] 03/14/2021, 2:04 PM

## 2021-03-14 NOTE — Assessment & Plan Note (Signed)
a1c of 5.9 today Diet/lifestyle modifications

## 2021-03-14 NOTE — Assessment & Plan Note (Addendum)
No evidence of flair at this time continue to monitor clinically

## 2021-03-14 NOTE — ED Triage Notes (Signed)
BIB [facility] after husband called to report that pt had fallen but caught herself outside r/t to pt feeling of generalized weakness. Pt fell EMS, pt did not loose  consciousness. L arm restricted.  HX: dialysis   Pt received 324 aspirin

## 2021-03-14 NOTE — Assessment & Plan Note (Signed)
Stable, continue to follow  Nephrology following

## 2021-03-14 NOTE — Assessment & Plan Note (Signed)
Blood pressure low side, but coming off anesthesia Add back home medication as tolerated

## 2021-03-14 NOTE — Consult Note (Addendum)
Medical Consultation   Latasha Drake  OIZ:[id number]  DOB: 11/11/1980  DOA: 03/14/2021  PCP: Katherina Mires, MD   Outpatient Specialists: nephrology: Dr. Hollie Salk   Requesting physician: Dr. Claiborne Billings   Reason for consultation: Manage chronic disease    History of Present Illness: Latasha Drake is an 41 y.o. female with past medical history significant for lupus, ESRD on dialysis TTS, IDA, pulmonary HTN, depression, HTN who presented to ED after collapsing at home. She was in her backyard shed and became light headed, dizzy, weak. She walked into her house and collapsed and could not get up. Her husband called 16. She came to ED and was found to have a STEMI. She was taken to the cath lab and found to have no blockage.   At bedside she is still quite groggy from her procedure. Able to tell me she felt weak and collapsed this AM. She still feels weak, but denies any headache, chest pain or palpitations, shortness of breath or cough, stomach pain, N/V/D or leg swelling. Also denies dysuria.   She had her full dialysis session yesterday.   Pertinent labs: covid/flu negative. A1c: 5.9, hgb 11.1, troponin: 10 CXR: no active disease   Review of Systems:   Per HPI; otherwise negative  Past Medical History: Past Medical History:  Diagnosis Date   Anemia    Chronic kidney disease    Hypertension    Lupus ([place])    Pericardial effusion 04/13/2019   circumferential   Pneumonia 04/12/2019   Pulmonary hypertension ([place]) 04/13/2019   moderate    Past Surgical History: Past Surgical History:  Procedure Laterality Date   CESAREAN SECTION     HERNIA REPAIR     IR FLUORO GUIDE CV LINE RIGHT  04/19/2019   IR FLUORO GUIDE CV LINE RIGHT  04/22/2019   IR US GUIDE VASC ACCESS RIGHT  04/19/2019   IR US GUIDE VASC ACCESS RIGHT  04/22/2019     Allergies:   Allergies  Allergen Reactions   Baclofen Other (See Comments)    Tremors   Lisinopril Swelling    Lips swelled     Social  History:  reports that she has been smoking cigarettes. She has been smoking an average of .5 packs per day. She has never used smokeless tobacco. She reports current alcohol use. She reports that she does not use drugs.   Family History: Family History  Problem Relation Age of Onset   Lupus Other        cousin     Physical Exam: Vitals:   03/14/21 1415 03/14/21 1430 03/14/21 1445 03/14/21 1600  BP: 113/76     Pulse: 69 75 72   Resp: 15 16 13    Temp:    97.7 F (36.5 C)  TempSrc:    Oral  SpO2: 100% 100% 100%     Constitutional:  groggy, but easily aroused. oriented x3, not in any acute distress. Eyes: PERLA, EOMI, irises appear normal, anicteric sclera,  ENMT: external ears and nose appear normal, normal hearing             Lips appears normal, oropharynx mucosa, tongue, posterior pharynx appear normal  Neck: neck appears normal, no masses, normal ROM, no thyromegaly, no JVD  CVS: S1-S2 clear, no murmur rubs or gallops, no LE edema, normal pedal pulses  Respiratory:  clear to auscultation bilaterally, no wheezing, rales or rhonchi. Respiratory effort normal. No  accessory muscle use.  Abdomen: soft nontender, nondistended, normal bowel sounds, no hepatosplenomegaly, no hernias  Musculoskeletal: : no cyanosis, clubbing or edema noted bilaterally                        Neuro: deferred, groggy, easily falls asleep.  Psych: appears wnl, limited exam.  Skin: no rashes or lesions or ulcers, no induration or nodules   Data reviewed:  I have personally reviewed following labs and imaging studies Labs:  CBC: Recent Labs  Lab 03/14/21 1247 03/14/21 1335  WBC 5.6  --   NEUTROABS 3.3  --   HGB 11.1* 10.5*  HCT 32.9* 31.0*  MCV 80.2  --   PLT 192  --     Basic Metabolic Panel: Recent Labs  Lab 03/14/21 1335 03/14/21 1423  NA 131* 132*  K 4.6 5.4*  CL 93* 90*  CO2  --  25  GLUCOSE 113* 85  BUN 29* 29*  CREATININE 8.10* 8.46*  CALCIUM  --  9.7   GFR CrCl cannot be  calculated (Unknown ideal weight.). Liver Function Tests: Recent Labs  Lab 03/14/21 1423  AST 18  ALT 12  ALKPHOS 77  BILITOT 0.6  PROT 7.0  ALBUMIN 3.7   No results for input(s): LIPASE, AMYLASE in the last 168 hours. No results for input(s): AMMONIA in the last 168 hours. Coagulation profile Recent Labs  Lab 03/14/21 1247  INR 1.0    Cardiac Enzymes: No results for input(s): CKTOTAL, CKMB, CKMBINDEX, TROPONINI in the last 168 hours. BNP: Invalid input(s): POCBNP CBG: No results for input(s): GLUCAP in the last 168 hours. D-Dimer Recent Labs    03/14/21 1259  DDIMER 0.54*   Hgb A1c Recent Labs    03/14/21 1247  HGBA1C 5.9*   Lipid Profile Recent Labs    03/14/21 1247  CHOL 207*  HDL 42  LDLCALC 138*  TRIG 135  CHOLHDL 4.9   Thyroid function studies No results for input(s): TSH, T4TOTAL, T3FREE, THYROIDAB in the last 72 hours.  Invalid input(s): FREET3 Anemia work up No results for input(s): VITAMINB12, FOLATE, FERRITIN, TIBC, IRON, RETICCTPCT in the last 72 hours. Urinalysis    Component Value Date/Time   COLORURINE YELLOW 08/28/2019 0126   APPEARANCEUR CLEAR 08/28/2019 0126   LABSPEC 1.011 08/28/2019 0126   PHURINE 6.0 08/28/2019 0126   GLUCOSEU NEGATIVE 08/28/2019 0126   HGBUR SMALL (A) 08/28/2019 0126   BILIRUBINUR NEGATIVE 08/28/2019 0126   KETONESUR NEGATIVE 08/28/2019 0126   PROTEINUR >=300 (A) 08/28/2019 0126   NITRITE NEGATIVE 08/28/2019 0126   LEUKOCYTESUR TRACE (A) 08/28/2019 0126     Sepsis Labs Invalid input(s): PROCALCITONIN,  WBC,  LACTICIDVEN Microbiology Recent Results (from the past 240 hour(s))  Resp Panel by RT-PCR (Flu A&B, Covid) Nasopharyngeal Swab     Status: None   Collection Time: 03/14/21 12:47 PM   Specimen: Nasopharyngeal Swab; Nasopharyngeal(NP) swabs in vial transport medium  Result Value Ref Range Status   SARS Coronavirus 2 by RT PCR NEGATIVE NEGATIVE Final    Comment: (NOTE) SARS-CoV-2 target nucleic  acids are NOT DETECTED.  The SARS-CoV-2 RNA is generally detectable in upper respiratory specimens during the acute phase of infection. The lowest concentration of SARS-CoV-2 viral copies this assay can detect is 138 copies/mL. A negative result does not preclude SARS-Cov-2 infection and should not be used as the sole basis for treatment or other patient management decisions. A negative result may occur with  improper specimen  collection/handling, submission of specimen other than nasopharyngeal swab, presence of viral mutation(s) within the areas targeted by this assay, and inadequate number of viral copies(<138 copies/mL). A negative result must be combined with clinical observations, patient history, and epidemiological information. The expected result is Negative.  Fact Sheet for Patients:  EntrepreneurPulse.com.au  Fact Sheet for Healthcare Providers:  IncredibleEmployment.be  This test is no t yet approved or cleared by the [place] FDA and  has been authorized for detection and/or diagnosis of SARS-CoV-2 by FDA under an Emergency Use Authorization (EUA). This EUA will remain  in effect (meaning this test can be used) for the duration of the COVID-19 declaration under Section 564(b)(1) of the Act, 21 U.S.C.section 360bbb-3(b)(1), unless the authorization is terminated  or revoked sooner.       Influenza A by PCR NEGATIVE NEGATIVE Final   Influenza B by PCR NEGATIVE NEGATIVE Final    Comment: (NOTE) The Xpert Xpress SARS-CoV-2/FLU/RSV plus assay is intended as an aid in the diagnosis of influenza from Nasopharyngeal swab specimens and should not be used as a sole basis for treatment. Nasal washings and aspirates are unacceptable for Xpert Xpress SARS-CoV-2/FLU/RSV testing.  Fact Sheet for Patients: EntrepreneurPulse.com.au  Fact Sheet for Healthcare Providers: IncredibleEmployment.be  This  test is not yet approved or cleared by the [place] FDA and has been authorized for detection and/or diagnosis of SARS-CoV-2 by FDA under an Emergency Use Authorization (EUA). This EUA will remain in effect (meaning this test can be used) for the duration of the COVID-19 declaration under Section 564(b)(1) of the Act, 21 U.S.C. section 360bbb-3(b)(1), unless the authorization is terminated or revoked.  Performed at [facility], [place] [address], [place], [place] [address]   MRSA Next Gen by PCR, Nasal     Status: None   Collection Time: 03/14/21  2:13 PM   Specimen: Nasal Mucosa; Nasal Swab  Result Value Ref Range Status   MRSA by PCR Next Gen NOT DETECTED NOT DETECTED Final    Comment: (NOTE) The GeneXpert MRSA Assay (FDA approved for NASAL specimens only), is one component of a comprehensive MRSA colonization surveillance program. It is not intended to diagnose MRSA infection nor to guide or monitor treatment for MRSA infections. Test performance is not FDA approved in patients less than 58 years old. Performed at [facility], [place] [address] [place], [place] [address]      Inpatient Medications:   Scheduled Meds: Continuous Infusions:   Radiological Exams on Admission: CARDIAC CATHETERIZATION  Result Date: 03/14/2021   Mid LM to Dist LM lesion is 10% stenosed.   Ost Cx lesion is 20% stenosed.   Prox LAD to Mid LAD lesion is 20% stenosed.   Mid LAD lesion is 20% stenosed.   Prox RCA lesion is 30% stenosed.   Mid RCA lesion is 20% stenosed.   LV end diastolic pressure is normal.   The left ventricular ejection fraction is 55-65% by visual estimate. 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 Normal LV systolic function with EF estimated at 60 to 65%; LVEDP 8 mm. There is a elliptical mobile density which seems to move with movement of the heart.  Question if pericardial. RECOMMENDATION: The  patient was initially hypotensive on presentation EMS.  We will gently hydrate.  Suspect possible pericarditis in the etiology of her upsloping ST elevation inferiorly.  Plan 2D echo Doppler study to evaluate pericardium and cardiac  chambers.  We will need to gradually resume her medication but will not start presently awaiting blood pressure improvement.  Patient's last day of dialysis was yesterday.  Notify nephrology of admission.   DG Chest Port 1 View  Result Date: 03/14/2021 CLINICAL DATA:  Fall.  Generalized weakness. EXAM: PORTABLE CHEST 1 VIEW COMPARISON:  12/06/2020 and older exams. FINDINGS: Cardiac silhouette is normal in size. No mediastinal or hilar masses. Mild linear opacity above and elevated right hemidiaphragm consistent with atelectasis. Lungs otherwise clear. No convincing pleural effusion and no pneumothorax. Skeletal structures are grossly intact IMPRESSION: No active disease. Electronically Signed   By: Lajean Manes M.D.   On: 03/14/2021 13:19    Impression/Recommendations STEMI (ST elevation myocardial infarction) ([place])- (present on admission) 41 year old presenting with weakness, fatigue, dizziness and collapsing found to have STEMI. LHC with mild non obstructing CAD -has elliptical mobile density which seems to move with movement of the heart. -echo -? Pericarditis -check inflammatory markers  -troponin pending -covid negative  -plan per cardiology   Weakness Large diffential LHC with mild nonobstructive disease that doesn't account for ST elevation ? Pericarditis, echo pending. Check inflammatory markers Will check TSH, B12 Orthostatics when off bed rest and able  Continue to follow clinically   ESRD on dialysis [facility]) Last completed her full dialysis session yesterday Euvolemic. Has received 101cc will need to monitor volume status closely Nephrology has been consulted, appreciate assistance.   Hypertension- (present on admission) Blood pressure low side, but  coming off anesthesia Add back home medication as tolerated   Lupus ([place])- (present on admission) No evidence of flair at this time continue to monitor clinically   Iron deficiency anemia, unspecified- (present on admission) Stable, continue to follow  Nephrology following   MDD (major depressive disorder)- (present on admission) Continue celexa   Prediabetes a1c of 5.9 today Diet/lifestyle modifications     Thank you for this consultation.  Our [facility] hospitalist team will follow the patient with you.   Time Spent: 43 minutes   Orma Flaming M.D. [facility] 03/14/2021, 5:14 PM

## 2021-03-14 NOTE — Plan of Care (Signed)
  Problem: Education: Goal: Knowledge of General Education information will improve Description: Including pain rating scale, medication(s)/side effects and non-pharmacologic comfort measures Outcome: Progressing   Problem: Clinical Measurements: Goal: Will remain free from infection Outcome: Progressing   

## 2021-03-14 NOTE — Assessment & Plan Note (Signed)
Continue celexa

## 2021-03-15 ENCOUNTER — Inpatient Hospital Stay (HOSPITAL_COMMUNITY): Payer: Medicare Other

## 2021-03-15 ENCOUNTER — Encounter (HOSPITAL_COMMUNITY): Payer: Self-pay | Admitting: Cardiovascular Disease

## 2021-03-15 DIAGNOSIS — R008 Other abnormalities of heart beat: Secondary | ICD-10-CM | POA: Diagnosis not present

## 2021-03-15 DIAGNOSIS — I2510 Atherosclerotic heart disease of native coronary artery without angina pectoris: Secondary | ICD-10-CM | POA: Diagnosis not present

## 2021-03-15 DIAGNOSIS — R9431 Abnormal electrocardiogram [ECG] [EKG]: Secondary | ICD-10-CM | POA: Diagnosis not present

## 2021-03-15 LAB — BASIC METABOLIC PANEL
Anion gap: 16 — ABNORMAL HIGH (ref 5–15)
BUN: 38 mg/dL — ABNORMAL HIGH (ref 6–20)
CO2: 24 mmol/L (ref 22–32)
Calcium: 8.8 mg/dL — ABNORMAL LOW (ref 8.9–10.3)
Chloride: 93 mmol/L — ABNORMAL LOW (ref 98–111)
Creatinine, Ser: 9.62 mg/dL — ABNORMAL HIGH (ref 0.44–1.00)
GFR, Estimated: 5 mL/min — ABNORMAL LOW (ref >60)
Glucose, Bld: 83 mg/dL (ref 70–99)
Potassium: 5.5 mmol/L — ABNORMAL HIGH (ref 3.5–5.1)
Sodium: 133 mmol/L — ABNORMAL LOW (ref 135–145)

## 2021-03-15 LAB — CBC
HCT: 29.2 % — ABNORMAL LOW (ref 36.0–46.0)
Hemoglobin: 10 g/dL — ABNORMAL LOW (ref 12.0–15.0)
MCH: 27 pg (ref 26.0–34.0)
MCHC: 34.2 g/dL (ref 30.0–36.0)
MCV: 78.9 fL — ABNORMAL LOW (ref 80.0–100.0)
Platelets: 183 10*3/uL (ref 150–400)
RBC: 3.7 MIL/uL — ABNORMAL LOW (ref 3.87–5.11)
RDW: 15.8 % — ABNORMAL HIGH (ref 11.5–15.5)
WBC: 6.3 10*3/uL (ref 4.0–10.5)
nRBC: 0 % (ref 0.0–0.2)

## 2021-03-15 LAB — ECHOCARDIOGRAM COMPLETE
Area-P 1/2: 2.82 cm2
Height: 64 in
S' Lateral: 3.1 cm
Weight: 2892.44 oz

## 2021-03-15 MED ORDER — AMLODIPINE BESYLATE 10 MG PO TABS: ORAL_TABLET | ORAL | 2 refills | Status: DC

## 2021-03-15 MED ORDER — NITROGLYCERIN 0.4 MG SL SUBL: 0.4000 mg | SUBLINGUAL_TABLET | SUBLINGUAL | Status: DC | PRN

## 2021-03-15 MED ORDER — SODIUM ZIRCONIUM CYCLOSILICATE 10 G PO PACK
10.0000 g | PACK | Freq: Once | ORAL | Status: AC
  Administered 2021-03-15: 10 g via ORAL
  Filled 2021-03-15: qty 1

## 2021-03-15 MED ORDER — ASPIRIN EC 81 MG PO TBEC: 81.0000 mg | DELAYED_RELEASE_TABLET | Freq: Every day | ORAL | 11 refills | Status: DC

## 2021-03-15 MED ORDER — ATORVASTATIN CALCIUM 40 MG PO TABS
40.0000 mg | ORAL_TABLET | Freq: Every day | ORAL | Status: DC
  Administered 2021-03-15: 40 mg via ORAL
  Filled 2021-03-15: qty 1

## 2021-03-15 MED ORDER — LOSARTAN POTASSIUM 25 MG PO TABS: 12.5000 mg | ORAL_TABLET | Freq: Every day | ORAL | Status: DC

## 2021-03-15 MED ORDER — ATORVASTATIN CALCIUM 40 MG PO TABS: 40.0000 mg | ORAL_TABLET | Freq: Every day | ORAL | 3 refills | Status: DC

## 2021-03-15 MED ORDER — CHLORHEXIDINE GLUCONATE CLOTH 2 % EX PADS: 6.0000 | MEDICATED_PAD | Freq: Every day | CUTANEOUS | Status: DC

## 2021-03-15 MED ORDER — AMLODIPINE BESYLATE 10 MG PO TABS: 10.0000 mg | ORAL_TABLET | ORAL | Status: DC

## 2021-03-15 MED FILL — Verapamil HCl IV Soln 2.5 MG/ML: INTRAVENOUS | Qty: 2 | Status: AC

## 2021-03-15 NOTE — Progress Notes (Signed)
Echocardiogram 2D Echocardiogram has been performed.  Oneal Deputy Odeal Welden RDCS 03/15/2021, 11:23 AM

## 2021-03-15 NOTE — Progress Notes (Addendum)
Progress Note  Patient Name: Latasha Drake Date of Encounter: 03/15/2021  [facility] Cardiologist: Shelva Majestic, MD    Subjective   No acute events overnight.  Denies chest pain or dyspnea.  No events on telemetry.  Inpatient Medications    Scheduled Meds:  aspirin  81 mg Oral Daily   atorvastatin  40 mg Oral Daily   Chlorhexidine Gluconate Cloth  6 each Topical Daily   sodium chloride flush  3 mL Intravenous Q12H   Continuous Infusions:  sodium chloride     PRN Meds: sodium chloride, acetaminophen, ondansetron (ZOFRAN) IV, sodium chloride flush   Vital Signs    Vitals:   03/15/21 0659 03/15/21 0755 03/15/21 0800 03/15/21 0900  BP:   114/70 128/86  Pulse: 81  83 78  Resp: 16  13 19   Temp:  98.5 F (36.9 C)    TempSrc:  Oral    SpO2: 95%  96% 97%  Weight: 82 kg     Height:        Intake/Output Summary (Last 24 hours) at 03/15/2021 0919 Last data filed at 03/15/2021 0900 Gross per 24 hour  Intake 1003.18 ml  Output --  Net 1003.18 ml   Last 3 Weights 03/15/2021 12/07/2020 12/06/2020  Weight (lbs) 180 lb 12.4 oz 180 lb 1.9 oz 179 lb 14.3 oz  Weight (kg) 82 kg 81.7 kg 81.6 kg      Telemetry    SR without NSVT - Personally Reviewed  ECG     SR with LVH and improved ST changes- Personally Reviewed  Physical Exam   GEN: No acute distress.   Neck: No JVD Cardiac: RRR, no murmurs, rubs, or gallops.  Respiratory: Clear to auscultation bilaterally. GI: Soft, nontender, non-distended  MS: No edema; No deformity; LUE fistula in place; R groin access intact Neuro:  Nonfocal  Psych: Normal affect   Labs    High Sensitivity Troponin:   Recent Labs  Lab 03/14/21 1423 03/14/21 1728  TROPONINIHS 10 21*     Chemistry Recent Labs  Lab 03/14/21 1335 03/14/21 1423 03/15/21 0050  NA 131* 132* 133*  K 4.6 5.4* 5.5*  CL 93* 90* 93*  CO2  --  25 24  GLUCOSE 113* 85 83  BUN 29* 29* 38*  CREATININE 8.10* 8.46* 9.62*  CALCIUM  --  9.7 8.8*  PROT  --  7.0   --   ALBUMIN  --  3.7  --   AST  --  18  --   ALT  --  12  --   ALKPHOS  --  77  --   BILITOT  --  0.6  --   GFRNONAA  --  6* 5*  ANIONGAP  --  17* 16*    Lipids  Recent Labs  Lab 03/14/21 1247  CHOL 207*  TRIG 135  HDL 42  LDLCALC 138*  CHOLHDL 4.9    Hematology Recent Labs  Lab 03/14/21 1247 03/14/21 1335 03/15/21 0050  WBC 5.6  --  6.3  RBC 4.10  --  3.70*  HGB 11.1* 10.5* 10.0*  HCT 32.9* 31.0* 29.2*  MCV 80.2  --  78.9*  MCH 27.1  --  27.0  MCHC 33.7  --  34.2  RDW 15.8*  --  15.8*  PLT 192  --  183   Thyroid  Recent Labs  Lab 03/14/21 1728  TSH 0.512    BNP Recent Labs  Lab 03/14/21 1248  BNP 15.8  DDimer  Recent Labs  Lab 03/14/21 1259  DDIMER 0.54*     Radiology    CARDIAC CATHETERIZATION  Result Date: 03/14/2021   Mid LM to Dist LM lesion is 10% stenosed.   Ost Cx lesion is 20% stenosed.   Prox LAD to Mid LAD lesion is 20% stenosed.   Mid LAD lesion is 20% stenosed.   Prox RCA lesion is 30% stenosed.   Mid RCA lesion is 20% stenosed.   LV end diastolic pressure is normal.   The left ventricular ejection fraction is 55-65% by visual estimate. 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 Normal LV systolic function with EF estimated at 60 to 65%; LVEDP 8 mm. There is a elliptical mobile density which seems to move with movement of the heart.  Question if pericardial. RECOMMENDATION: The patient was initially hypotensive on presentation EMS.  We will gently hydrate.  Suspect possible pericarditis in the etiology of her upsloping ST elevation inferiorly.  Plan 2D echo Doppler study to evaluate pericardium and cardiac chambers.  We will need to gradually resume her medication but will not start presently awaiting blood pressure improvement.  Patient's last day of dialysis was yesterday.  Notify nephrology of admission.   DG Chest Port 1 View  Result Date: 03/14/2021 CLINICAL DATA:   Fall.  Generalized weakness. EXAM: PORTABLE CHEST 1 VIEW COMPARISON:  12/06/2020 and older exams. FINDINGS: Cardiac silhouette is normal in size. No mediastinal or hilar masses. Mild linear opacity above and elevated right hemidiaphragm consistent with atelectasis. Lungs otherwise clear. No convincing pleural effusion and no pneumothorax. Skeletal structures are grossly intact IMPRESSION: No active disease. Electronically Signed   By: Lajean Manes M.D.   On: 03/14/2021 13:19    Cardiac Studies   Cath 03/14/21   Mid LM to Dist LM lesion is 10% stenosed.   Ost Cx lesion is 20% stenosed.   Prox LAD to Mid LAD lesion is 20% stenosed.   Mid LAD lesion is 20% stenosed.   Prox RCA lesion is 30% stenosed.   Mid RCA lesion is 20% stenosed.   LV end diastolic pressure is normal.   The left ventricular ejection fraction is 55-65% by visual estimate.  Patient Profile     41 y.o. female ESRD on TuThSat HD, HTN, Lupus here with syncope/pre-syncope and dynamic ST changes on EKG  Assessment & Plan    Acute coronary syndrome:  HS trop mildly elevated and only mild CAD on cardiac catheterization.  Cont ASA 81 and start atorvastatin 40mg .  Start PRN NTG.  Follow up echo today.  Likely d/c later today (if stays in house, transfer to floor).  Cardiac mass:  Reviewed cardiac catheterization.  Somewhat mobile mass seen on fluoroscopy.  Will obtain TTE to evaluate further.  May need to consider CT or MRI evaluation (as outpatient).  MINOCA:  Consider cardiac MR to evaluate as outpatient.  This would allow evaluation of cardiac/pericardial mass as well.  HL:  Start atorvastatin for mild CAD  Syncope:  Apparently did not finish HD on Saturday due to low BP.  Symptoms on Sunday likely due to volume depletion + BP meds.  HTN:  Restart home meds, unable to start losartan due to K 5.5  For questions or updates, please contact Latasha Drake Please consult [email or web address] for contact info under         Signed, Early Osmond, MD  03/15/2021, 9:19 AM

## 2021-03-15 NOTE — Progress Notes (Signed)
[facility] Progress Note  Subjective: doing well, seen in room, wants to go home   Vitals:   03/15/21 0800 03/15/21 0900 03/15/21 1000 03/15/21 1100  BP: 114/70 128/86 (!) 122/95 (!) 151/98  Pulse: 83 78 78 77  Resp: 13 19 (!) 23 19  Temp:      TempSrc:      SpO2: 96% 97% 93% 98%  Weight:      Height:        Exam:  alert, nad   no jvd  Chest cta bilat  Cor reg no RG  Abd soft ntnd no ascites   Ext no LE edema   Alert, NF, ox3    LUA AVF+bruit   Dialysis Orders:  [facility] [place]  3.5h  400/ 600   79kg    2K/ 2.5Ca  LUA AVF  Hep none  Mircera 150 mcg q2wks - last given 12/29/20 Hectorol 65mcg IV qHD    Per outpatient record, hypertensive regimen: amlodipine 10mg  qd, clonidine 0.1mg  TID, furosemide 40mg  qd, hydralazine 100mg  TID, labetalol 300mg  BID    Assessment/Plan:  Presyncope - w/ low BP in ED. Pt may have been vol depleted related to lean body weight gain. We will raise her dry wt (as below) at OP HD. For dc today.  ST elevation - on presenting EKG. Had LHC w/ only mild non obstructive CAD w/o significant focal stenosis Cardiac mass - echo showed elliptical mobile density. Per cardiology to f/u in OP setting.   ESRD -  on HD TTS.  Next HD 03/16/21.   BP/volume - Hypotensive initially.  Home meds on hold. OP med list as above.  Cardiology gave IV NS  516mL bolus in ED.  Patient not getting to outpatient dry weight and may have gained weight as she appears euvolemic on exam.  Standing wt today is 82.5kg this am, will raise pt's dry weight to the same.    Anemia of CKD - Hgb 10.5. No indication for ESA at this time.  Follow labs.  Secondary Hyperparathyroidism -  Check Ca and phos.    Nutrition - Renal diet w/fluid restrictions.  Lupus - no recent flares    Rob Eraina Winnie 03/15/2021, 12:21 PM   Recent Labs  Lab 03/14/21 1335 03/14/21 1423 03/15/21 0050  K 4.6 5.4* 5.5*  BUN 29* 29* 38*  CREATININE 8.10* 8.46* 9.62*  CALCIUM  --  9.7 8.8*  HGB 10.5*  --   10.0*   Inpatient medications:  [START ON 03/17/2021] amLODipine  10 mg Oral Once per day on Sun Mon Wed Fri   aspirin  81 mg Oral Daily   atorvastatin  40 mg Oral Daily   Chlorhexidine Gluconate Cloth  6 each Topical Daily   sodium chloride flush  3 mL Intravenous Q12H    sodium chloride     sodium chloride, acetaminophen, nitroGLYCERIN, ondansetron (ZOFRAN) IV, sodium chloride flush

## 2021-03-15 NOTE — Discharge Summary (Addendum)
Discharge Summary    Patient ID: Latasha Drake MRN: [id number]; DOB: 1980-05-14  Admit date: 03/14/2021 Discharge date: 03/15/2021  PCP:  Katherina Mires, MD   [facility] Providers Cardiologist:  Shelva Majestic, MD   {  Discharge Diagnoses    Principal Problem:   STEMI (ST elevation myocardial infarction) [facility]) Active Problems:   Lupus ([place])   Hypertension   ESRD on dialysis ([place])   Iron deficiency anemia, unspecified   MDD (major depressive disorder)   Weakness   Prediabetes   ? Cardiac mass    Pre-syncope/volume depletion with hypotension   Diagnostic Studies/Procedures    Echo 03/15/21  1. Left ventricular ejection fraction, by estimation, is 60 to 65%. The  left ventricle has normal function. The left ventricle has no regional  wall motion abnormalities. There is mild left ventricular hypertrophy.  Left ventricular diastolic parameters  were normal.   2. Right ventricular systolic function is normal. The right ventricular  size is normal. There is normal pulmonary artery systolic pressure. The  estimated right ventricular systolic pressure is 92.4 mmHg.   3. Left atrial size was mildly dilated.   4. A small pericardial effusion is present.   5. The mitral valve is normal in structure. Trivial mitral valve  regurgitation.   6. The aortic valve is tricuspid. Aortic valve regurgitation is not  visualized. No aortic stenosis is present.   7. The inferior vena cava is normal in size with greater than 50%  respiratory variability, suggesting right atrial pressure of 3 mmHg.   Coronary/Graft Acute MI Revascularization 03/14/2021  LEFT HEART CATH AND CORONARY ANGIOGRAPHY   Conclusion      Mid LM to Dist LM lesion is 10% stenosed.   Ost Cx lesion is 20% stenosed.   Prox LAD to Mid LAD lesion is 20% stenosed.   Mid LAD lesion is 20% stenosed.   Prox RCA lesion is 30% stenosed.   Mid RCA lesion is 20% stenosed.   LV end diastolic pressure is normal.   The left  ventricular ejection fraction is 55-65% by visual estimate.   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   Normal LV systolic function with EF estimated at 60 to 65%; LVEDP 8 mm.   There is a elliptical mobile density which seems to move with movement of the heart.  Question if pericardial.    RECOMMENDATION: The patient was initially hypotensive on presentation EMS.  We will gently hydrate.  Suspect possible pericarditis in the etiology of her upsloping ST elevation inferiorly.  Plan 2D echo Doppler study to evaluate pericardium and cardiac chambers.  We will need to gradually resume her medication but will not start presently awaiting blood pressure improvement.  Patient's last day of dialysis was yesterday.  Notify nephrology of admission.   Diagnostic Dominance: Right   History of Present Illness     Latasha Drake is a 41 y.o. female with hx of Lupus, ESRD on HD, restless leg syndrome and HTN, who is admitted for the evaluation of STEMI.  Latasha Drake was BIB EMS after she had generalized weakness and fall. She did not lose consciousness.  She was in the shed in her back yard when she became light headed, dizziness, weakness and fatigue.  States she went into the house and collapsed and was unable to get up.  Her husband called 68 and she came to the ED. She denied chest pain but was hypotensive  She received IVF in the ER. EKG concerning for ST elevation in her inferior and lateral leads. CODE STEMI was activated. She states her weakness was not consistent with a lupus flare.    She was hospitalized in Oct 2022 with shortness of breath after missing 2 HD sessions found to have PNA. PNA was treated with ABX. She apparently was not taking any medications for her lupus at that time.    PTA, appears she was maintained on 10 mg amlodipine, 0.1 mg TID clonidine, and hydralazine 100 mg TID.    Hospital Course     Consultants:  Nephrology & Hospitalist    STE on EKG: Cath showed mild nonobstructive CAD without significant focal stenosis to account for her 2 mm of inferolateral ST elevation. Normal LV systolic function with EF estimated at 60 to 65%; LVEDP 8 mm. There is a elliptical mobile density which seems to move with movement of the heart.  Question if pericardial. Echo with preserved LVEF. ? Pericarditis but pleuritic symptoms. Start ASA and statin.   2. Cardiac mass - Cath showed somewhat mobile mass but no comment on echo. Could consider outpatient cMRI for further evaluation per Dr. Ali Lowe.   3. Pre-syncope/ Volume depletion/hypotension  4. ESRD on HD ([facility]) - She was unable to finish dialysis on Saturday due to low blood pressure. It was felt that her syncope on Sunday likely due to volume depletion + BP meds. Given IV NS  518mL bolus in ED. Antihypertensive held with improved blood pressure. Seen by nephology and hospitalist. Restarted only amlodipine on Sunday, Monday, Wednesday and Friday. She will complete her dialysis as schedule tomorrow. Weight 82.5Kg today. Avoid ACE/ARB due to K of 5.5 and also has ESRD.   5. HLD - 03/14/2021: Cholesterol 207; HDL 42; LDL Cholesterol 138; Triglycerides 135; VLDL 27  - Started Lipitor 40mg  qd    Did the patient have an acute coronary syndrome (MI, NSTEMI, STEMI, etc) this admission?:  No                               Did the patient have a percutaneous coronary intervention (stent / angioplasty)?:  No.    Discharge Vitals Blood pressure 137/81, pulse 84, temperature 98.5 F (36.9 C), temperature source Oral, resp. rate 18, height 5\' 4"  (1.626 m), weight 82.6 kg, SpO2 96 %.  Filed Weights   03/15/21 0659 03/15/21 1000  Weight: 82 kg 82.6 kg    Labs & Radiologic Studies    CBC Recent Labs    03/14/21 1247 03/14/21 1335 03/15/21 0050  WBC 5.6  --  6.3  NEUTROABS 3.3  --   --   HGB 11.1* 10.5* 10.0*  HCT 32.9* 31.0* 29.2*  MCV 80.2  --  78.9*  PLT 192   --  628   Basic Metabolic Panel Recent Labs    03/14/21 1423 03/15/21 0050  NA 132* 133*  K 5.4* 5.5*  CL 90* 93*  CO2 25 24  GLUCOSE 85 83  BUN 29* 38*  CREATININE 8.46* 9.62*  CALCIUM 9.7 8.8*   Liver Function Tests Recent Labs    03/14/21 1423  AST 18  ALT 12  ALKPHOS 77  BILITOT 0.6  PROT 7.0  ALBUMIN 3.7   High Sensitivity Troponin:   Recent Labs  Lab 03/14/21 1423 03/14/21 1728  TROPONINIHS 10 21*    BNP Invalid input(s): POCBNP D-Dimer Recent Labs    03/14/21  1259  DDIMER 0.54*   Hemoglobin A1C Recent Labs    03/14/21 1247  HGBA1C 5.9*   Fasting Lipid Panel Recent Labs    03/14/21 1247  CHOL 207*  HDL 42  LDLCALC 138*  TRIG 135  CHOLHDL 4.9   Thyroid Function Tests Recent Labs    03/14/21 1728  TSH 0.512   _____________  CARDIAC CATHETERIZATION  Result Date: 03/14/2021   Mid LM to Dist LM lesion is 10% stenosed.   Ost Cx lesion is 20% stenosed.   Prox LAD to Mid LAD lesion is 20% stenosed.   Mid LAD lesion is 20% stenosed.   Prox RCA lesion is 30% stenosed.   Mid RCA lesion is 20% stenosed.   LV end diastolic pressure is normal.   The left ventricular ejection fraction is 55-65% by visual estimate. 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 Normal LV systolic function with EF estimated at 60 to 65%; LVEDP 8 mm. There is a elliptical mobile density which seems to move with movement of the heart.  Question if pericardial. RECOMMENDATION: The patient was initially hypotensive on presentation EMS.  We will gently hydrate.  Suspect possible pericarditis in the etiology of her upsloping ST elevation inferiorly.  Plan 2D echo Doppler study to evaluate pericardium and cardiac chambers.  We will need to gradually resume her medication but will not start presently awaiting blood pressure improvement.  Patient's last day of dialysis was yesterday.  Notify nephrology of admission.    DG Chest Port 1 View  Result Date: 03/14/2021 CLINICAL DATA:  Fall.  Generalized weakness. EXAM: PORTABLE CHEST 1 VIEW COMPARISON:  12/06/2020 and older exams. FINDINGS: Cardiac silhouette is normal in size. No mediastinal or hilar masses. Mild linear opacity above and elevated right hemidiaphragm consistent with atelectasis. Lungs otherwise clear. No convincing pleural effusion and no pneumothorax. Skeletal structures are grossly intact IMPRESSION: No active disease. Electronically Signed   By: Lajean Manes M.D.   On: 03/14/2021 13:19   ECHOCARDIOGRAM COMPLETE  Result Date: 03/15/2021    ECHOCARDIOGRAM REPORT   Patient Name:   KIYO HEAL Date of Exam: 03/15/2021 Medical Rec #:  [id number]   Height:       64.0 in Accession #:    [id number]  Weight:       180.8 lb Date of Birth:  1980/09/05  BSA:          1.874 m Patient Age:    78 years    BP:           122/95 mmHg Patient Gender: F           HR:           77 bpm. Exam Location:  Inpatient Procedure: 2D Echo, Color Doppler and Cardiac Doppler Indications:    Other abnormalities of the heart  History:        Patient has prior history of Echocardiogram examinations, most                 recent 04/19/2019. Risk Factors:Hypertension and Lupus.  Sonographer:    Raquel Sarna Senior RDCS Referring Phys: [id number] [place]  Sonographer Comments: See sonographer comments IMPRESSIONS  1. Left ventricular ejection fraction, by estimation, is 60 to 65%. The left ventricle has normal function. The left ventricle has no regional wall motion abnormalities. There is mild left ventricular hypertrophy. Left ventricular diastolic parameters were normal.  2. Right ventricular systolic function  is normal. The right ventricular size is normal. There is normal pulmonary artery systolic pressure. The estimated right ventricular systolic pressure is 35.5 mmHg.  3. Left atrial size was mildly dilated.  4. A small pericardial effusion is present.  5. The mitral valve is normal in  structure. Trivial mitral valve regurgitation.  6. The aortic valve is tricuspid. Aortic valve regurgitation is not visualized. No aortic stenosis is present.  7. The inferior vena cava is normal in size with greater than 50% respiratory variability, suggesting right atrial pressure of 3 mmHg. FINDINGS  Left Ventricle: Left ventricular ejection fraction, by estimation, is 60 to 65%. The left ventricle has normal function. The left ventricle has no regional wall motion abnormalities. The left ventricular internal cavity size was normal in size. There is  mild left ventricular hypertrophy. Left ventricular diastolic parameters were normal. Right Ventricle: The right ventricular size is normal. No increase in right ventricular wall thickness. Right ventricular systolic function is normal. There is normal pulmonary artery systolic pressure. The tricuspid regurgitant velocity is 2.23 m/s, and  with an assumed right atrial pressure of 3 mmHg, the estimated right ventricular systolic pressure is 73.2 mmHg. Left Atrium: Left atrial size was mildly dilated. Right Atrium: Right atrial size was normal in size. Pericardium: A small pericardial effusion is present. Mitral Valve: The mitral valve is normal in structure. Trivial mitral valve regurgitation. Tricuspid Valve: The tricuspid valve is normal in structure. Tricuspid valve regurgitation is trivial. Aortic Valve: The aortic valve is tricuspid. Aortic valve regurgitation is not visualized. No aortic stenosis is present. Pulmonic Valve: The pulmonic valve was grossly normal. Pulmonic valve regurgitation is mild. Aorta: The aortic root and ascending aorta are structurally normal, with no evidence of dilitation. Venous: The inferior vena cava is normal in size with greater than 50% respiratory variability, suggesting right atrial pressure of 3 mmHg. IAS/Shunts: The interatrial septum was not well visualized.  LEFT VENTRICLE PLAX 2D LVIDd:         5.00 cm   Diastology LVIDs:          3.10 cm   LV e' medial:    9.90 cm/s LV PW:         1.20 cm   LV E/e' medial:  10.5 LV IVS:        1.10 cm   LV e' lateral:   11.10 cm/s LVOT diam:     2.10 cm   LV E/e' lateral: 9.4 LV SV:         101 LV SV Index:   54 LVOT Area:     3.46 cm  RIGHT VENTRICLE RV S prime:     14.40 cm/s TAPSE (M-mode): 1.9 cm LEFT ATRIUM             Index        RIGHT ATRIUM           Index LA diam:        3.90 cm 2.08 cm/m   RA Area:     11.10 cm LA Vol (A2C):   67.9 ml 36.23 ml/m  RA Volume:   23.30 ml  12.43 ml/m LA Vol (A4C):   62.4 ml 33.30 ml/m LA Biplane Vol: 66.6 ml 35.54 ml/m  AORTIC VALVE LVOT Vmax:   151.00 cm/s LVOT Vmean:  109.000 cm/s LVOT VTI:    0.291 m  AORTA Ao Root diam: 3.10 cm Ao Asc diam:  3.50 cm MITRAL VALVE  TRICUSPID VALVE MV Area (PHT): 2.82 cm     TR Peak grad:   19.9 mmHg MV Decel Time: 269 msec     TR Vmax:        223.00 cm/s MV E velocity: 104.00 cm/s MV A velocity: 86.20 cm/s   SHUNTS MV E/A ratio:  1.21         Systemic VTI:  0.29 m                             Systemic Diam: 2.10 cm Oswaldo Milian MD Electronically signed by Oswaldo Milian MD Signature Date/Time: 03/15/2021/12:05:50 PM    Final    Disposition   Pt is being discharged home today in good condition.  Follow-up Plans & Appointments     Follow-up Information     [facility], [place] Follow up on 04/06/2021.   Specialties: Physician Assistant, Cardiology, Radiology Why: @2 :20pm for hospital follow up with Dr. Evette Georges PA. Please arrive 15 minutes early. please call if needs to rearrage. Contact information: [address] [place] [address] [phone]                Discharge Instructions     Diet - low sodium heart healthy   Complete by: As directed    Discharge instructions   Complete by: As directed    No driving for 48 hours. No lifting over 5 lbs for 1 week. No sexual activity for 1 week. Keep procedure site clean & dry. If you notice increased pain,  swelling, bleeding or pus, call/return!  You may shower, but no soaking baths/hot tubs/pools for 1 week.   Increase activity slowly   Complete by: As directed        Discharge Medications   Allergies as of 03/15/2021       Reactions   Baclofen Other (See Comments)   Tremors   Lisinopril Swelling   Lips swelled        Medication List     STOP taking these medications    ciprofloxacin 500 MG tablet Commonly known as: CIPRO   cloNIDine 0.1 MG tablet Commonly known as: CATAPRES   hydrALAZINE 100 MG tablet Commonly known as: APRESOLINE   labetalol 300 MG tablet Commonly known as: NORMODYNE       TAKE these medications    amLODipine 10 MG tablet Commonly known as: NORVASC Take one tablet (10 mg) by mouth in the morning on non-dialysis days - Sunday, Monday, Wednesday, Friday; take one tablet (10 mg) daily at bedtime What changed:  how much to take how to take this when to take this   aspirin EC 81 MG tablet Take 1 tablet (81 mg total) by mouth daily. Swallow whole.   atorvastatin 40 MG tablet Commonly known as: LIPITOR Take 1 tablet (40 mg total) by mouth daily. Start taking on: March 16, 2021   citalopram 20 MG tablet Commonly known as: CELEXA Take 20 mg by mouth at bedtime.   clotrimazole 1 % external solution Commonly known as: LOTRIMIN See admin instructions. Instill 5 drops into right ear twice daily until healed   clotrimazole-betamethasone cream Commonly known as: LOTRISONE See admin instructions. Apply topically to right ear twice daily until healed   cyclobenzaprine 10 MG tablet Commonly known as: FLEXERIL Take 10 mg by mouth 3 (three) times daily as needed for muscle spasms.   pantoprazole 40 MG tablet Commonly known as: PROTONIX Take 40 mg  by mouth daily as needed (indigestion).   paragard intrauterine copper Iud IUD 1 Intra Uterine Device by Intrauterine route once.   pregabalin 150 MG capsule Commonly known as: LYRICA Take  150-300 mg by mouth See admin instructions. Take one capsule (150 mg) by mouth every morning with a 75 mg capsule (225 mg total); take two capsule (300 mg) with a 75 mg capsule (375 mg total) at night   pregabalin 75 MG capsule Commonly known as: LYRICA Take 75 mg by mouth 2 (two) times daily. Take with one 150 mg capsule in the morning (225 mg total) and with two 150 mg capsules at night (375 mg total)   rOPINIRole 0.5 MG tablet Commonly known as: REQUIP Take 0.5 mg by mouth 2 (two) times daily.   sevelamer carbonate 800 MG tablet Commonly known as: RENVELA Take 1,600 mg by mouth 2 (two) times daily with a meal.           Outstanding Labs/Studies   Consider OP f/u labs 6-8 weeks given statin initiation this admission.   Duration of Discharge Encounter   Greater than 30 minutes including physician time.  Jarrett Soho, PA 03/15/2021, 12:57 PM  ATTENDING ATTESTATION:  After conducting a review of all available clinical information with the care team, interviewing the patient, and performing a physical exam, I agree with the findings and plan described in this note.   GEN: No acute distress.   Cardiac: RRR, no murmurs, rubs, or gallops.  Respiratory: Clear to auscultation bilaterally. GI: Soft, nontender, non-distended  MS: No edema; No deformity. Neuro:  Nonfocal   Patient doing well.  Denies chest pain for dyspnea.  Unusual globular structure in pericardium on fluoroscopic imaging however echo without obvious masses.  Could consider additional imaging as outpatient.  Will discharge today and resume home amlodipine.    Lenna Sciara, MD Pager [phone]

## 2021-03-16 ENCOUNTER — Telehealth: Payer: Self-pay | Admitting: Physician Assistant

## 2021-03-16 NOTE — Telephone Encounter (Signed)
Transition of care contact from inpatient facility  Date of Discharge: 03/15/21 Date of Contact: 03/16/21 Method of contact: Phone  Attempted to contact patient to discuss transition of care from inpatient admission. Patient did not answer the phone. Mailbox was full, unable to leave message.   Anice Paganini, PA-C 03/16/2021, 1:27 PM  [employer]

## 2021-04-05 NOTE — Progress Notes (Deleted)
Cardiology Office Note:    Date:  04/05/2021   ID:  Mandee Pluta, DOB 1980-07-24, MRN [id number]  PCP:  Katherina Mires, MD   [facility] Providers Cardiologist:  Shelva Majestic, MD { Click to update primary MD,subspecialty MD or APP then REFRESH:1}    Referring MD: Katherina Mires, MD   No chief complaint on file. ***  History of Present Illness:    Latasha Drake is a 41 y.o. female with a hx of ESRD on HD, lupus, restless leg syndrome, hypertension, nonobstructive CAD.  She was admitted 03/14/2021 following weakness and a fall.  On arrival in the ER, EKG concerning for ST elevation in her inferior and lateral leads.  Code STEMI was activated.  She states her weakness was not consistent with a lupus flare.  She also denied chest pain.  She was taken emergently to the Cath Lab for definitive angiography which showed mild nonobstructive CAD without significant focal stenosis to account for 2 mm ST elevation in inferior lateral leads.  LVEF estimated at 60 to 65% with an LVEDP 8.  During the cath, there was an elliptical mobile density which seem to move with movement of the heart, question of pericardial.  Follow-up echocardiogram confirmed LVEF 60 to 65% normal RV function small pericardial effusion and no significant valvular disease.  Pericarditis was suspected.  She was started on aspirin and statin.     MINOCA Mild nonobstructive CAD Recent STE on EKG following weakness and fall possibly related to dehydration. Consider cMRI   Cardiac mass Cath showed somewhat mobile mass but no comment on echo.  May consider see MRI for further evaluation.   Presyncope ESRD on HD (T TH SAT) She has been compliant with dialysis, although prior to her admission dialysis was complicated by hypotension requiring IV fluids. Avoid ACE/ARB due to K 5.5 She was restarted on amlodipine on her HD days.   Hyperlipidemia with LDL goal less than 70 03/14/2021: Cholesterol 207; HDL 42; LDL Cholesterol 138;  Triglycerides 135; VLDL 27 She was started on 40 mg Lipitor 03/15/2021 Recheck labs, although she is a dialysis patient    Past Medical History:  Diagnosis Date   Anemia    Chronic kidney disease    Hypertension    Lupus ([place])    Pericardial effusion 04/13/2019   circumferential   Pneumonia 04/12/2019   Pulmonary hypertension ([place]) 04/13/2019   moderate    Past Surgical History:  Procedure Laterality Date   CESAREAN SECTION     CORONARY/GRAFT ACUTE MI REVASCULARIZATION N/A 03/14/2021   Procedure: Coronary/Graft Acute MI Revascularization;  Surgeon: Troy Sine, MD;  Location: [place] [facility];  Service: Cardiovascular;  Laterality: N/A;   HERNIA REPAIR     IR FLUORO GUIDE CV LINE RIGHT  04/19/2019   IR FLUORO GUIDE CV LINE RIGHT  04/22/2019   IR US GUIDE VASC ACCESS RIGHT  04/19/2019   IR US GUIDE VASC ACCESS RIGHT  04/22/2019   LEFT HEART CATH AND CORONARY ANGIOGRAPHY N/A 03/14/2021   Procedure: LEFT HEART CATH AND CORONARY ANGIOGRAPHY;  Surgeon: Troy Sine, MD;  Location: [place] [facility];  Service: Cardiovascular;  Laterality: N/A;    Current Medications: No outpatient medications have been marked as taking for the 04/06/21 encounter (Appointment) with Ledora Bottcher, [place].     Allergies:   Baclofen and Lisinopril   Social History   Socioeconomic History   Marital status: Divorced    Spouse name: Not on file  Number of children: Not on file   Years of education: Not on file   Highest education level: Not on file  Occupational History   Not on file  Tobacco Use   Smoking status: Every Day    Packs/day: 0.50    Types: Cigarettes   Smokeless tobacco: Never  Vaping Use   Vaping Use: Not on file  Substance and Sexual Activity   Alcohol use: Yes    Comment: occassionally   Drug use: No   Sexual activity: Not on file  Other Topics Concern   Not on file  Social History Narrative   Not on file   Social Determinants of Health   Financial Resource  Strain: Not on file  Food Insecurity: Not on file  Transportation Needs: Not on file  Physical Activity: Not on file  Stress: Not on file  Social Connections: Not on file     Family History: The patient's ***family history includes Lupus in an other family member.  ROS:   Please see the history of present illness.    *** All other systems reviewed and are negative.  EKGs/Labs/Other Studies Reviewed:    The following studies were reviewed today:  Echo 03/15/21  1. Left ventricular ejection fraction, by estimation, is 60 to 65%. The  left ventricle has normal function. The left ventricle has no regional  wall motion abnormalities. There is mild left ventricular hypertrophy.  Left ventricular diastolic parameters  were normal.   2. Right ventricular systolic function is normal. The right ventricular  size is normal. There is normal pulmonary artery systolic pressure. The  estimated right ventricular systolic pressure is 37.6 mmHg.   3. Left atrial size was mildly dilated.   4. A small pericardial effusion is present.   5. The mitral valve is normal in structure. Trivial mitral valve  regurgitation.   6. The aortic valve is tricuspid. Aortic valve regurgitation is not  visualized. No aortic stenosis is present.   7. The inferior vena cava is normal in size with greater than 50%  respiratory variability, suggesting right atrial pressure of 3 mmHg.    Coronary/Graft Acute MI Revascularization 03/14/2021  LEFT HEART CATH AND CORONARY ANGIOGRAPHY    Conclusion       Mid LM to Dist LM lesion is 10% stenosed.   Ost Cx lesion is 20% stenosed.   Prox LAD to Mid LAD lesion is 20% stenosed.   Mid LAD lesion is 20% stenosed.   Prox RCA lesion is 30% stenosed.   Mid RCA lesion is 20% stenosed.   LV end diastolic pressure is normal.   The left ventricular ejection fraction is 55-65% by visual estimate.   Mild nonobstructive CAD without significant focal stenosis to account for her 2 mm  of inferolateral ST elevation.   Normal LV systolic function with EF estimated at 60 to 65%; LVEDP 8 mm.   There is a elliptical mobile density which seems to move with movement of the heart.  Question if pericardial.    RECOMMENDATION: The patient was initially hypotensive on presentation EMS.  We will gently hydrate.  Suspect possible pericarditis in the etiology of her upsloping ST elevation inferiorly.  Plan 2D echo Doppler study to evaluate pericardium and cardiac chambers.  We will need to gradually resume her medication but will not start presently awaiting blood pressure improvement.  Patient's last day of dialysis was yesterday.  Notify nephrology of admission.   Diagnostic Dominance: Right    EKG:  EKG  is *** ordered today.  The ekg ordered today demonstrates ***  Recent Labs: 03/14/2021: ALT 12; B Natriuretic Peptide 15.8; TSH 0.512 03/15/2021: BUN 38; Creatinine, Ser 9.62; Hemoglobin 10.0; Platelets 183; Potassium 5.5; Sodium 133  Recent Lipid Panel    Component Value Date/Time   CHOL 207 (H) 03/14/2021 1247   TRIG 135 03/14/2021 1247   HDL 42 03/14/2021 1247   CHOLHDL 4.9 03/14/2021 1247   VLDL 27 03/14/2021 1247   LDLCALC 138 (H) 03/14/2021 1247     Risk Assessment/Calculations:   {Does this patient have ATRIAL FIBRILLATION?:[id number]}       Physical Exam:    VS:  There were no vitals taken for this visit.    Wt Readings from Last 3 Encounters:  03/15/21 182 lb 1.6 oz (82.6 kg)  12/07/20 180 lb 1.9 oz (81.7 kg)  11/04/20 184 lb 8.4 oz (83.7 kg)     GEN: *** Well nourished, well developed in no acute distress HEENT: Normal NECK: No JVD; No carotid bruits LYMPHATICS: No lymphadenopathy CARDIAC: ***RRR, no murmurs, rubs, gallops RESPIRATORY:  Clear to auscultation without rales, wheezing or rhonchi  ABDOMEN: Soft, non-tender, non-distended MUSCULOSKELETAL:  No edema; No deformity  SKIN: Warm and dry NEUROLOGIC:  Alert and oriented x 3 PSYCHIATRIC:   Normal affect   ASSESSMENT:    No diagnosis found. PLAN:    In order of problems listed above:  ***      {Are you ordering a CV Procedure (e.g. stress test, cath, DCCV, TEE, etc)?   Press F2        :[id number]}    Medication Adjustments/Labs and Tests Ordered: Current medicines are reviewed at length with the patient today.  Concerns regarding medicines are outlined above.  No orders of the defined types were placed in this encounter.  No orders of the defined types were placed in this encounter.   There are no Patient Instructions on file for this visit.   Signed, Tami Lin Paityn Balsam, PA  04/05/2021 3:02 PM    [facility]

## 2021-04-06 ENCOUNTER — Emergency Department (HOSPITAL_COMMUNITY): Payer: Medicare Other

## 2021-04-06 ENCOUNTER — Encounter (HOSPITAL_COMMUNITY): Payer: Self-pay | Admitting: Emergency Medicine

## 2021-04-06 ENCOUNTER — Emergency Department (HOSPITAL_COMMUNITY)
Admission: EM | Admit: 2021-04-06 | Discharge: 2021-04-06 | Disposition: A | Discharge status: Home / Self Care | Payer: Medicare Other | Attending: Emergency Medicine | Admitting: Emergency Medicine

## 2021-04-06 ENCOUNTER — Ambulatory Visit: Payer: Medicare Other | Admitting: Physician Assistant

## 2021-04-06 DIAGNOSIS — J189 Pneumonia, unspecified organism: Secondary | ICD-10-CM | POA: Insufficient documentation

## 2021-04-06 DIAGNOSIS — N9489 Other specified conditions associated with female genital organs and menstrual cycle: Secondary | ICD-10-CM | POA: Insufficient documentation

## 2021-04-06 DIAGNOSIS — R072 Precordial pain: Secondary | ICD-10-CM | POA: Diagnosis present

## 2021-04-06 LAB — BASIC METABOLIC PANEL
Anion gap: 14 (ref 5–15)
BUN: 36 mg/dL — ABNORMAL HIGH (ref 6–20)
CO2: 29 mmol/L (ref 22–32)
Calcium: 8.7 mg/dL — ABNORMAL LOW (ref 8.9–10.3)
Chloride: 97 mmol/L — ABNORMAL LOW (ref 98–111)
Creatinine, Ser: 7.26 mg/dL — ABNORMAL HIGH (ref 0.44–1.00)
GFR, Estimated: 7 mL/min — ABNORMAL LOW (ref >60)
Glucose, Bld: 81 mg/dL (ref 70–99)
Potassium: 4.4 mmol/L (ref 3.5–5.1)
Sodium: 140 mmol/L (ref 135–145)

## 2021-04-06 LAB — CBC
HCT: 24.2 % — ABNORMAL LOW (ref 36.0–46.0)
Hemoglobin: 8.2 g/dL — ABNORMAL LOW (ref 12.0–15.0)
MCH: 27 pg (ref 26.0–34.0)
MCHC: 33.9 g/dL (ref 30.0–36.0)
MCV: 79.6 fL — ABNORMAL LOW (ref 80.0–100.0)
Platelets: 221 10*3/uL (ref 150–400)
RBC: 3.04 MIL/uL — ABNORMAL LOW (ref 3.87–5.11)
RDW: 15.8 % — ABNORMAL HIGH (ref 11.5–15.5)
WBC: 8 10*3/uL (ref 4.0–10.5)
nRBC: 0.4 % — ABNORMAL HIGH (ref 0.0–0.2)

## 2021-04-06 LAB — I-STAT BETA HCG BLOOD, ED (MC, WL, AP ONLY): I-stat hCG, quantitative: <5 m[IU]/mL (ref <5)

## 2021-04-06 LAB — TROPONIN I (HIGH SENSITIVITY)
Troponin I (High Sensitivity): 5 ng/L (ref <18)
Troponin I (High Sensitivity): 7 ng/L (ref <18)

## 2021-04-06 MED ORDER — IOHEXOL 350 MG/ML SOLN
75.0000 mL | Freq: Once | INTRAVENOUS | Status: AC | PRN
  Administered 2021-04-06: 75 mL via INTRAVENOUS

## 2021-04-06 MED ORDER — AMOXICILLIN-POT CLAVULANATE 875-125 MG PO TABS: 1.0000 | ORAL_TABLET | ORAL | 0 refills | Status: DC

## 2021-04-06 MED ORDER — CLONIDINE HCL 0.1 MG PO TABS
0.1000 mg | ORAL_TABLET | Freq: Once | ORAL | Status: AC
  Administered 2021-04-06: 0.1 mg via ORAL
  Filled 2021-04-06: qty 1

## 2021-04-06 MED ORDER — AMLODIPINE BESYLATE 5 MG PO TABS
10.0000 mg | ORAL_TABLET | Freq: Once | ORAL | Status: AC
  Administered 2021-04-06: 10 mg via ORAL
  Filled 2021-04-06: qty 2

## 2021-04-06 MED ORDER — AMOXICILLIN-POT CLAVULANATE 875-125 MG PO TABS
1.0000 | ORAL_TABLET | Freq: Once | ORAL | Status: AC
  Administered 2021-04-06: 1 via ORAL
  Filled 2021-04-06: qty 1

## 2021-04-06 MED ORDER — FENTANYL CITRATE PF 50 MCG/ML IJ SOSY
50.0000 ug | PREFILLED_SYRINGE | Freq: Once | INTRAMUSCULAR | Status: AC
  Administered 2021-04-06: 50 ug via INTRAVENOUS
  Filled 2021-04-06: qty 1

## 2021-04-06 NOTE — Discharge Instructions (Signed)
Your blood pressure was slightly elevated today.  Please follow-up with your family doctor for recheck in the next week.  Please call your rheumatologist for recheck as well.  It is important that she have your dialysis session on Thursday.  Please be careful on your fluid consumption until your dialysis sessions since you did not have a full treatment today.  Get rechecked sooner if you have new or concerning symptoms.

## 2021-04-06 NOTE — ED Notes (Signed)
No response x2

## 2021-04-06 NOTE — ED Triage Notes (Signed)
Patient BIB [facility] from dialysis center for complaint of chest pain, finished 50% of normal treatment, T,Th,Sat schedule, access in left arm. After loading into ambulance pain resolved, also complains of productive cough for ten days. On arrival to ED patient reports 0/10 pain and is alert, oriented, and in no apparent distress at this time.  324 mg ASA PTA from EMS.

## 2021-04-06 NOTE — ED Provider Triage Note (Signed)
Emergency Medicine Provider Triage Evaluation Note  Rayleen Wyrick , a 41 y.o. female  was evaluated in triage.  Pt complains of chest pain. Patient was at dialysis center and got 50% through normal treatment (T/R/S). After loading patient into ambulance she states the pain resolved. She also complains of a productive cough x 10 days. EMS gave 324 mg ASA.  Patient has not missed any dialysis treatments as of recently, but states that she did have a past history of missing several treatments.  She was admitted on 1/8 for similar chest pain and was found to have a STEMI, they took her to the Cath Lab and found clean coronaries.  She was discharged the next day in stable condition.  Review of Systems  Positive: Chest pain, cough, wheezing Negative: Fever, chills, shortness of breath, nausea, vomiting  Physical Exam  BP (!) 168/111 (BP Location: Right Arm)    Pulse 98    Temp 98.3 F (36.8 C) (Oral)    Resp 16    SpO2 99%  Gen:   Awake, no distress   Resp:  Normal effort  MSK:   Moves extremities without difficulty  Other:    Medical Decision Making  Medically screening exam initiated at 12:33 PM.  Appropriate orders placed.  Wilber Oliphant was informed that the remainder of the evaluation will be completed by another provider, this initial triage assessment does not replace that evaluation, and the importance of remaining in the ED until their evaluation is complete.     Stephan Draughn T, PA-C 04/06/21 1233

## 2021-04-06 NOTE — ED Provider Notes (Signed)
[facility] EMERGENCY DEPARTMENT Provider Note   CSN: [id number] Arrival date & time: 04/06/21  1220     History  Chief Complaint  Patient presents with   Chest Pain    Latasha Drake is a 41 y.o. female.  The history is provided by the patient and medical records.  Chest Pain Latasha Drake is a 41 y.o. female who presents to the Emergency Department complaining of chest pain.  She presents to the ED for evaluation of sudden onset of sharp, central chest pain that started during dialysis.  She asked the dialysis nurse to rinse back with no improvement in sxs.  She has associated cough for the last ten days.  Chest pain resolved at time of ED arrival.  She was about half way through her session.  She dialyzes T, TH, Sa.    No fever.  Cough is productive of brown sputum.  No hemoptysis.  No SOB.  No abdominal pain, N/V/D.  No lower extremity edema.    Has been on dialysis for two years due to lupus.    Records reviewed in epic.  Patient was admitted January 8 for ST elevation MI in setting of syncopal event.  She had a cardiac cath with no obstructive disease.  She did have hypotension at time of admission and multiple antihypertensives were discontinued at that time.  She was subsequently discharged on January 9.  No history of DVT/PE.  She is not currently on any medications for her lupus.    Home Medications Prior to Admission medications   Medication Sig Start Date End Date Taking? Authorizing Provider  amoxicillin-clavulanate (AUGMENTIN) 875-125 MG tablet Take 1 tablet by mouth every other day. Take one tablet after dialysis on Thursday and take one tablet after dialysis on Saturday. 04/06/21  Yes Quintella Reichert, MD  amLODipine (NORVASC) 10 MG tablet Take one tablet (10 mg) by mouth in the morning on non-dialysis days - Sunday, Monday, Wednesday, Friday; take one tablet (10 mg) daily at bedtime 03/15/21   Leanor Kail, PA  aspirin EC 81 MG tablet Take 1 tablet  (81 mg total) by mouth daily. Swallow whole. 03/15/21   Bhagat, Crista Luria, PA  atorvastatin (LIPITOR) 40 MG tablet Take 1 tablet (40 mg total) by mouth daily. 03/16/21   Bhagat, Crista Luria, PA  citalopram (CELEXA) 20 MG tablet Take 20 mg by mouth at bedtime. 11/28/20   [provider]  clotrimazole (LOTRIMIN) 1 % external solution See admin instructions. Instill 5 drops into right ear twice daily until healed 03/10/21   [provider]  clotrimazole-betamethasone (LOTRISONE) cream See admin instructions. Apply topically to right ear twice daily until healed 03/10/21   [provider]  cyclobenzaprine (FLEXERIL) 10 MG tablet Take 10 mg by mouth 3 (three) times daily as needed for muscle spasms.  04/03/19   [provider]  pantoprazole (PROTONIX) 40 MG tablet Take 40 mg by mouth daily as needed (indigestion).  10/27/16   [provider]  paragard intrauterine copper IUD IUD 1 Intra Uterine Device by Intrauterine route once.    [provider]  pregabalin (LYRICA) 150 MG capsule Take 150-300 mg by mouth See admin instructions. Take one capsule (150 mg) by mouth every morning with a 75 mg capsule (225 mg total); take two capsule (300 mg) with a 75 mg capsule (375 mg total) at night 09/16/20   [provider]  pregabalin (LYRICA) 75 MG capsule Take 75 mg by mouth 2 (two) times daily. Take  with one 150 mg capsule in the morning (225 mg total) and with two 150 mg capsules at night (375 mg total) 03/08/21   [provider]  rOPINIRole (REQUIP) 0.5 MG tablet Take 0.5 mg by mouth 2 (two) times daily. 02/01/21   [provider]  sevelamer carbonate (RENVELA) 800 MG tablet Take 1,600 mg by mouth 2 (two) times daily with a meal. 08/26/20   [provider]      Allergies    Baclofen and Lisinopril    Review of Systems   Review of Systems  Cardiovascular:  Positive for chest pain.  All other systems reviewed and are  negative.  Physical Exam Updated Vital Signs BP (!) 169/119    Pulse 93    Temp 98.3 F (36.8 C) (Oral)    Resp 18    LMP  (LMP Unknown)    SpO2 94%  Physical Exam Vitals and nursing note reviewed.  Constitutional:      Appearance: She is well-developed.  HENT:     Head: Normocephalic and atraumatic.  Cardiovascular:     Rate and Rhythm: Normal rate and regular rhythm.     Heart sounds: No murmur heard. Pulmonary:     Effort: Pulmonary effort is normal. No respiratory distress.     Breath sounds: Normal breath sounds.  Abdominal:     Palpations: Abdomen is soft.     Tenderness: There is no abdominal tenderness. There is no guarding or rebound.  Musculoskeletal:        General: No swelling or tenderness.     Comments: Fistula in LUE with palpable thrill  Skin:    General: Skin is warm and dry.  Neurological:     Mental Status: She is alert and oriented to person, place, and time.  Psychiatric:        Behavior: Behavior normal.    ED Results / Procedures / Treatments   Labs (all labs ordered are listed, but only abnormal results are displayed) Labs Reviewed  BASIC METABOLIC PANEL - Abnormal; Notable for the following components:      Result Value   Chloride 97 (*)    BUN 36 (*)    Creatinine, Ser 7.26 (*)    Calcium 8.7 (*)    GFR, Estimated 7 (*)    All other components within normal limits  CBC - Abnormal; Notable for the following components:   RBC 3.04 (*)    Hemoglobin 8.2 (*)    HCT 24.2 (*)    MCV 79.6 (*)    RDW 15.8 (*)    nRBC 0.4 (*)    All other components within normal limits  I-STAT BETA HCG BLOOD, ED ([facility], [facility], [facility] ONLY)  TROPONIN I (HIGH SENSITIVITY)  TROPONIN I (HIGH SENSITIVITY)    EKG EKG Interpretation  Date/Time:  Tuesday April 06 2021 20:04:29 EST Ventricular Rate:  90 PR Interval:  146 QRS Duration: 99 QT Interval:  360 QTC Calculation: 441 R Axis:   62 Text Interpretation: Sinus rhythm Confirmed by Quintella Reichert [phone]) on  04/06/2021 8:35:30 PM  Radiology DG Chest 2 View  Result Date: 04/06/2021 CLINICAL DATA:  Chest pain. EXAM: CHEST - 2 VIEW COMPARISON:  Chest x-ray dated March 14, 2021. FINDINGS: Unchanged mild cardiomegaly and pulmonary vascular congestion. Unchanged round calcification in the inferior pericardial sac. No focal consolidation, pleural effusion, or pneumothorax. No acute osseous abnormality. Unchanged elevation of the right hemidiaphragm. IMPRESSION: 1. Unchanged mild cardiomegaly and pulmonary vascular congestion. Electronically Signed  By: Titus Dubin M.D.   On: 04/06/2021 13:13   CT Angio Chest PE W/Cm &/Or Wo Cm  Result Date: 04/06/2021 CLINICAL DATA:  Chest pain EXAM: CT ANGIOGRAPHY CHEST WITH CONTRAST TECHNIQUE: Multidetector CT imaging of the chest was performed using the standard protocol during bolus administration of intravenous contrast. Multiplanar CT image reconstructions and MIPs were obtained to evaluate the vascular anatomy. RADIATION DOSE REDUCTION: This exam was performed according to the departmental dose-optimization program which includes automated exposure control, adjustment of the mA and/or kV according to patient size and/or use of iterative reconstruction technique. CONTRAST:  38mL OMNIPAQUE IOHEXOL 350 MG/ML SOLN COMPARISON:  CT chest with contrast dated April 12, 2019 FINDINGS: Cardiovascular: No evidence of pulmonary embolus, although contrast opacification is somewhat suboptimal which limits evaluation of the subsegmental pulmonary arteries. Trace pericardial fluid. Mild atherosclerotic disease of the abdominal aorta. Left main and three-vessel coronary artery calcifications. Mediastinum/Nodes: Mildly enlarged right axillary lymph nodes. Reference right axillary lymph node measuring 1.0 cm in short axis on series 5, image 33, likely reactive. Lungs/Pleura: Central airways are patent. Marked bilateral air trapping. Peripheral ground-glass opacities of the right upper  lobe. No consolidation, pleural effusion or pneumothorax. Upper Abdomen: No acute abnormality. Musculoskeletal: None Review of the MIP images confirms the above findings. IMPRESSION: 1. No evidence pulmonary embolus, although evaluation of the subsegmental pulmonary arteries is limited due to contrast timing. 2. Mild peripheral ground-glass opacities of the right upper lobe, likely infectious or inflammatory. 3. Marked bilateral mosaic attenuation, likely due to air trapping, which can be seen in the setting of small airways disease. 4. Aortic Atherosclerosis (ICD10-I70.0). Electronically Signed   By: Yetta Glassman M.D.   On: 04/06/2021 20:05    Procedures Procedures    Medications Ordered in ED Medications  fentaNYL (SUBLIMAZE) injection 50 mcg (50 mcg Intravenous Given 04/06/21 1836)  cloNIDine (CATAPRES) tablet 0.1 mg (0.1 mg Oral Given 04/06/21 2005)  iohexol (OMNIPAQUE) 350 MG/ML injection 75 mL (75 mLs Intravenous Contrast Given 04/06/21 1941)  amLODipine (NORVASC) tablet 10 mg (10 mg Oral Given 04/06/21 2053)  amoxicillin-clavulanate (AUGMENTIN) 875-125 MG per tablet 1 tablet (1 tablet Oral Given 04/06/21 2053)    ED Course/ Medical Decision Making/ A&P                           Medical Decision Making Amount and/or Complexity of Data Reviewed Radiology: ordered.  Risk Prescription drug management.   Patient with history of ESRD on dialysis due to lupus here for evaluation of chest pain during dialysis as well as cough.  Patient is hypertensive on ED evaluation with symmetric pulses.  Records reviewed in epic, her antihypertensive medicines were recently significantly decreased due to admission for hypotension as well as ST elevation MI.  She appears euvolemic on evaluation.  EKG is without acute ischemic changes and troponins are negative x2.  Given her recent admission, history of lupus and chest pain a CTA was obtained.  CTA is negative for PE but is concerning for right upper lobe  pneumonia.  Will start on antibiotics.  Feel the patient is stable for discharge home.  Discussed with patient importance of following up with her PCP regarding her hypertension as well as following up with her nephrologist regarding her lupus.  Discussed fluid restriction in setting of decreased dialysis session today with plan for her to resume dialysis on Thursday.  Current clinical picture is not consistent with hypertensive emergency, sepsis.  CBC does have anemia, patient's hemoglobin does fluctuate and this is within her range.  No reports of active bleeding.        Final Clinical Impression(s) / ED Diagnoses Final diagnoses:  Precordial pain  Community acquired pneumonia of right upper lobe of lung    Rx / DC Orders ED Discharge Orders          Ordered    amoxicillin-clavulanate (AUGMENTIN) 875-125 MG tablet  Every other day        04/06/21 2047              Quintella Reichert, MD 04/06/21 2131

## 2021-04-06 NOTE — ED Notes (Signed)
Patient transported to CT 

## 2021-05-17 ENCOUNTER — Emergency Department (HOSPITAL_COMMUNITY)
Admission: EM | Admit: 2021-05-17 | Discharge: 2021-05-18 | Disposition: A | Discharge status: Home / Self Care | Payer: Medicare Other | Source: Home / Self Care | Attending: Emergency Medicine | Admitting: Emergency Medicine

## 2021-05-17 ENCOUNTER — Other Ambulatory Visit: Payer: Self-pay

## 2021-05-17 ENCOUNTER — Emergency Department (HOSPITAL_COMMUNITY)
Admission: EM | Admit: 2021-05-17 | Discharge: 2021-05-17 | Disposition: A | Discharge status: Home / Self Care | Payer: Medicare Other | Source: Home / Self Care | Attending: Emergency Medicine | Admitting: Emergency Medicine

## 2021-05-17 ENCOUNTER — Emergency Department (HOSPITAL_COMMUNITY): Payer: Medicare Other

## 2021-05-17 ENCOUNTER — Encounter (HOSPITAL_COMMUNITY): Payer: Self-pay

## 2021-05-17 DIAGNOSIS — S0181XA Laceration without foreign body of other part of head, initial encounter: Secondary | ICD-10-CM | POA: Insufficient documentation

## 2021-05-17 DIAGNOSIS — Y92002 Bathroom of unspecified non-institutional (private) residence single-family (private) house as the place of occurrence of the external cause: Secondary | ICD-10-CM | POA: Insufficient documentation

## 2021-05-17 DIAGNOSIS — F919 Conduct disorder, unspecified: Secondary | ICD-10-CM | POA: Insufficient documentation

## 2021-05-17 DIAGNOSIS — N186 End stage renal disease: Secondary | ICD-10-CM

## 2021-05-17 DIAGNOSIS — Z992 Dependence on renal dialysis: Secondary | ICD-10-CM | POA: Insufficient documentation

## 2021-05-17 DIAGNOSIS — E876 Hypokalemia: Secondary | ICD-10-CM | POA: Insufficient documentation

## 2021-05-17 DIAGNOSIS — Z7982 Long term (current) use of aspirin: Secondary | ICD-10-CM | POA: Insufficient documentation

## 2021-05-17 DIAGNOSIS — I2510 Atherosclerotic heart disease of native coronary artery without angina pectoris: Secondary | ICD-10-CM | POA: Insufficient documentation

## 2021-05-17 DIAGNOSIS — Z79899 Other long term (current) drug therapy: Secondary | ICD-10-CM | POA: Insufficient documentation

## 2021-05-17 DIAGNOSIS — E875 Hyperkalemia: Secondary | ICD-10-CM

## 2021-05-17 DIAGNOSIS — W010XXA Fall on same level from slipping, tripping and stumbling without subsequent striking against object, initial encounter: Secondary | ICD-10-CM | POA: Insufficient documentation

## 2021-05-17 DIAGNOSIS — W19XXXA Unspecified fall, initial encounter: Secondary | ICD-10-CM

## 2021-05-17 DIAGNOSIS — S0101XA Laceration without foreign body of scalp, initial encounter: Secondary | ICD-10-CM | POA: Insufficient documentation

## 2021-05-17 DIAGNOSIS — I120 Hypertensive chronic kidney disease with stage 5 chronic kidney disease or end stage renal disease: Secondary | ICD-10-CM | POA: Insufficient documentation

## 2021-05-17 DIAGNOSIS — G928 Other toxic encephalopathy: Secondary | ICD-10-CM | POA: Diagnosis not present

## 2021-05-17 DIAGNOSIS — D631 Anemia in chronic kidney disease: Secondary | ICD-10-CM | POA: Insufficient documentation

## 2021-05-17 DIAGNOSIS — G9340 Encephalopathy, unspecified: Secondary | ICD-10-CM

## 2021-05-17 DIAGNOSIS — Y92009 Unspecified place in unspecified non-institutional (private) residence as the place of occurrence of the external cause: Secondary | ICD-10-CM

## 2021-05-17 DIAGNOSIS — R4182 Altered mental status, unspecified: Secondary | ICD-10-CM | POA: Insufficient documentation

## 2021-05-17 DIAGNOSIS — Z23 Encounter for immunization: Secondary | ICD-10-CM | POA: Insufficient documentation

## 2021-05-17 DIAGNOSIS — T40411A Poisoning by fentanyl or fentanyl analogs, accidental (unintentional), initial encounter: Secondary | ICD-10-CM | POA: Diagnosis not present

## 2021-05-17 LAB — I-STAT BETA HCG BLOOD, ED (MC, WL, AP ONLY): I-stat hCG, quantitative: <5 m[IU]/mL (ref <5)

## 2021-05-17 LAB — CBC WITH DIFFERENTIAL/PLATELET
Abs Immature Granulocytes: 0.02 10*3/uL (ref 0.00–0.07)
Abs Immature Granulocytes: 0.01 10*3/uL (ref 0.00–0.07)
Basophils Absolute: 0 10*3/uL (ref 0.0–0.1)
Basophils Absolute: 0 10*3/uL (ref 0.0–0.1)
Basophils Relative: 0 %
Basophils Relative: 1 %
Eosinophils Absolute: 0.2 10*3/uL (ref 0.0–0.5)
Eosinophils Absolute: 0.1 10*3/uL (ref 0.0–0.5)
Eosinophils Relative: 2 %
Eosinophils Relative: 2 %
HCT: 34.6 % — ABNORMAL LOW (ref 36.0–46.0)
HCT: 34.7 % — ABNORMAL LOW (ref 36.0–46.0)
Hemoglobin: 11.8 g/dL — ABNORMAL LOW (ref 12.0–15.0)
Hemoglobin: 12.1 g/dL (ref 12.0–15.0)
Immature Granulocytes: 0 %
Immature Granulocytes: 0 %
Lymphocytes Relative: 29 %
Lymphocytes Relative: 38 %
Lymphs Abs: 2.2 10*3/uL (ref 0.7–4.0)
Lymphs Abs: 2.3 10*3/uL (ref 0.7–4.0)
MCH: 28.3 pg (ref 26.0–34.0)
MCH: 28.8 pg (ref 26.0–34.0)
MCHC: 34.1 g/dL (ref 30.0–36.0)
MCHC: 34.9 g/dL (ref 30.0–36.0)
MCV: 83 fL (ref 80.0–100.0)
MCV: 82.6 fL (ref 80.0–100.0)
Monocytes Absolute: 0.8 10*3/uL (ref 0.1–1.0)
Monocytes Absolute: 0.7 10*3/uL (ref 0.1–1.0)
Monocytes Relative: 11 %
Monocytes Relative: 12 %
Neutro Abs: 4.3 10*3/uL (ref 1.7–7.7)
Neutro Abs: 2.8 10*3/uL (ref 1.7–7.7)
Neutrophils Relative %: 58 %
Neutrophils Relative %: 47 %
Platelets: 212 10*3/uL (ref 150–400)
Platelets: 227 10*3/uL (ref 150–400)
RBC: 4.17 MIL/uL (ref 3.87–5.11)
RBC: 4.2 MIL/uL (ref 3.87–5.11)
RDW: 17.4 % — ABNORMAL HIGH (ref 11.5–15.5)
RDW: 17.2 % — ABNORMAL HIGH (ref 11.5–15.5)
WBC: 7.5 10*3/uL (ref 4.0–10.5)
WBC: 6 10*3/uL (ref 4.0–10.5)
nRBC: 0 % (ref 0.0–0.2)
nRBC: 0 % (ref 0.0–0.2)

## 2021-05-17 LAB — I-STAT CHEM 8, ED
BUN: 23 mg/dL — ABNORMAL HIGH (ref 6–20)
Calcium, Ion: 1.24 mmol/L (ref 1.15–1.40)
Chloride: 92 mmol/L — ABNORMAL LOW (ref 98–111)
Creatinine, Ser: 9.3 mg/dL — ABNORMAL HIGH (ref 0.44–1.00)
Glucose, Bld: 91 mg/dL (ref 70–99)
HCT: 39 % (ref 36.0–46.0)
Hemoglobin: 13.3 g/dL (ref 12.0–15.0)
Potassium: 4.7 mmol/L (ref 3.5–5.1)
Sodium: 127 mmol/L — ABNORMAL LOW (ref 135–145)
TCO2: 26 mmol/L (ref 22–32)

## 2021-05-17 LAB — RAPID URINE DRUG SCREEN, HOSP PERFORMED
Amphetamines: NOT DETECTED
Barbiturates: NOT DETECTED
Benzodiazepines: NOT DETECTED
Cocaine: NOT DETECTED
Opiates: NOT DETECTED
Tetrahydrocannabinol: NOT DETECTED

## 2021-05-17 LAB — COMPREHENSIVE METABOLIC PANEL
ALT: 16 U/L (ref 0–44)
AST: 27 U/L (ref 15–41)
Albumin: 3.9 g/dL (ref 3.5–5.0)
Alkaline Phosphatase: 75 U/L (ref 38–126)
Anion gap: 17 — ABNORMAL HIGH (ref 5–15)
BUN: 32 mg/dL — ABNORMAL HIGH (ref 6–20)
CO2: 22 mmol/L (ref 22–32)
Calcium: 10.2 mg/dL (ref 8.9–10.3)
Chloride: 87 mmol/L — ABNORMAL LOW (ref 98–111)
Creatinine, Ser: 10.07 mg/dL — ABNORMAL HIGH (ref 0.44–1.00)
GFR, Estimated: 5 mL/min — ABNORMAL LOW (ref >60)
Glucose, Bld: 80 mg/dL (ref 70–99)
Potassium: 5.6 mmol/L — ABNORMAL HIGH (ref 3.5–5.1)
Sodium: 126 mmol/L — ABNORMAL LOW (ref 135–145)
Total Bilirubin: 0.7 mg/dL (ref 0.3–1.2)
Total Protein: 7 g/dL (ref 6.5–8.1)

## 2021-05-17 LAB — BASIC METABOLIC PANEL
Anion gap: 16 — ABNORMAL HIGH (ref 5–15)
BUN: 23 mg/dL — ABNORMAL HIGH (ref 6–20)
CO2: 24 mmol/L (ref 22–32)
Calcium: 10.3 mg/dL (ref 8.9–10.3)
Chloride: 88 mmol/L — ABNORMAL LOW (ref 98–111)
Creatinine, Ser: 8.16 mg/dL — ABNORMAL HIGH (ref 0.44–1.00)
GFR, Estimated: 6 mL/min — ABNORMAL LOW (ref >60)
Glucose, Bld: 91 mg/dL (ref 70–99)
Potassium: 4.7 mmol/L (ref 3.5–5.1)
Sodium: 128 mmol/L — ABNORMAL LOW (ref 135–145)

## 2021-05-17 LAB — TROPONIN I (HIGH SENSITIVITY)
Troponin I (High Sensitivity): 8 ng/L (ref <18)
Troponin I (High Sensitivity): 8 ng/L (ref <18)

## 2021-05-17 LAB — ETHANOL
Alcohol, Ethyl (B): <10 mg/dL (ref <10)
Alcohol, Ethyl (B): <10 mg/dL (ref <10)

## 2021-05-17 LAB — MAGNESIUM: Magnesium: 2.2 mg/dL (ref 1.7–2.4)

## 2021-05-17 MED ORDER — SODIUM ZIRCONIUM CYCLOSILICATE 10 G PO PACK
10.0000 g | PACK | Freq: Once | ORAL | Status: AC
  Administered 2021-05-17: 10 g via ORAL
  Filled 2021-05-17: qty 1

## 2021-05-17 MED ORDER — TETANUS-DIPHTH-ACELL PERTUSSIS 5-2.5-18.5 LF-MCG/0.5 IM SUSY
0.5000 mL | PREFILLED_SYRINGE | Freq: Once | INTRAMUSCULAR | Status: AC
  Administered 2021-05-17: 0.5 mL via INTRAMUSCULAR
  Filled 2021-05-17: qty 0.5

## 2021-05-17 NOTE — ED Notes (Signed)
Family has left bedside. Patient attempting to get out of bed. Redirectable back to bed, but easily disoriented after being left alone. Sitter ordered. ?

## 2021-05-17 NOTE — ED Notes (Signed)
Patient more alert. Oriented to self and location. Thinks it is the year 3000. ?

## 2021-05-17 NOTE — ED Triage Notes (Signed)
Pt w boyfriend c/o AMS since yesterday, seen for fall yesterday, dc home after cleared at 0600, boyfriend "shocked she was released d/t AMS, worsening throughout the day." Dialysis T, Th, Sat,compliant. Pt endorses 3x1000mg  CBD gummies today, denies ETOH, alcohol use.  ?

## 2021-05-17 NOTE — Discharge Instructions (Addendum)
Please make sure to go for your dialysis session today, as scheduled. ?

## 2021-05-17 NOTE — ED Notes (Signed)
Pt ambulated with steady gait. Pt expresses no concerns ?

## 2021-05-17 NOTE — ED Notes (Signed)
Patient moved to hospital bed with bed alarm. Transfers by sliding over on own ability. Secured via rails x3. Edge of bed alarm turned on and fall bracelet applied. ?

## 2021-05-17 NOTE — ED Provider Notes (Signed)
[facility] EMERGENCY DEPARTMENT Provider Note   CSN: [id number] Arrival date & time: 05/17/21  0050     History  Chief Complaint  Patient presents with   Lytle Michaels    Latasha Drake is a 41 y.o. female.  The history is provided by the EMS personnel and the patient. The history is limited by the condition of the patient (Altered mental status).  Fall She has history of hypertension, lupus, end-stage renal disease on hemodialysis, coronary artery disease and is brought in by ambulance following a fall at home.  Apparently she tripped and fell as she went into a bathroom, patient is unable to give significant history regarding this.  EMS reports family stated that she has been confused all day.  She is a dialysis patient and has been missing dialysis sessions, last dialysis session is unknown.  Tetanus status is unknown.  Patient denies ethanol and drug use.   Home Medications Prior to Admission medications   Medication Sig Start Date End Date Taking? Authorizing Provider  amLODipine (NORVASC) 10 MG tablet Take one tablet (10 mg) by mouth in the morning on non-dialysis days - Sunday, Monday, Wednesday, Friday; take one tablet (10 mg) daily at bedtime 03/15/21   Bhagat, Bhavinkumar, PA  amoxicillin-clavulanate (AUGMENTIN) 875-125 MG tablet Take 1 tablet by mouth every other day. Take one tablet after dialysis on Thursday and take one tablet after dialysis on Saturday. 04/06/21   Quintella Reichert, MD  aspirin EC 81 MG tablet Take 1 tablet (81 mg total) by mouth daily. Swallow whole. 03/15/21   Bhagat, Crista Luria, PA  atorvastatin (LIPITOR) 40 MG tablet Take 1 tablet (40 mg total) by mouth daily. 03/16/21   Bhagat, Crista Luria, PA  citalopram (CELEXA) 20 MG tablet Take 20 mg by mouth at bedtime. 11/28/20   [provider]  clotrimazole (LOTRIMIN) 1 % external solution See admin instructions. Instill 5 drops into right ear twice daily until healed 03/10/21   [provider]   clotrimazole-betamethasone (LOTRISONE) cream See admin instructions. Apply topically to right ear twice daily until healed 03/10/21   [provider]  cyclobenzaprine (FLEXERIL) 10 MG tablet Take 10 mg by mouth 3 (three) times daily as needed for muscle spasms.  04/03/19   [provider]  pantoprazole (PROTONIX) 40 MG tablet Take 40 mg by mouth daily as needed (indigestion).  10/27/16   [provider]  paragard intrauterine copper IUD IUD 1 Intra Uterine Device by Intrauterine route once.    [provider]  pregabalin (LYRICA) 150 MG capsule Take 150-300 mg by mouth See admin instructions. Take one capsule (150 mg) by mouth every morning with a 75 mg capsule (225 mg total); take two capsule (300 mg) with a 75 mg capsule (375 mg total) at night 09/16/20   [provider]  pregabalin (LYRICA) 75 MG capsule Take 75 mg by mouth 2 (two) times daily. Take with one 150 mg capsule in the morning (225 mg total) and with two 150 mg capsules at night (375 mg total) 03/08/21   [provider]  rOPINIRole (REQUIP) 0.5 MG tablet Take 0.5 mg by mouth 2 (two) times daily. 02/01/21   [provider]  sevelamer carbonate (RENVELA) 800 MG tablet Take 1,600 mg by mouth 2 (two) times daily with a meal. 08/26/20   [provider]      Allergies    Baclofen and Lisinopril    Review of Systems   Review of Systems  Unable to perform  ROS: Mental status change   Physical Exam Updated Vital Signs BP (!) 157/103    Pulse 98    Temp 98.3 F (36.8 C) (Oral)    Resp (!) 24    SpO2 100%  Physical Exam Vitals and nursing note reviewed.  41 year old [place], resting comfortably and in no acute distress. Vital signs are significant for elevated respiratory rate and blood pressure. Oxygen saturation is 100%, which is normal. Head is normocephalic.  Right frontal scalp laceration is present.Marland Kitchen PERRLA, EOMI. Oropharynx is clear. Neck is nontender without  adenopathy or JVD. Back is nontender and there is no CVA tenderness. Lungs are clear without rales, wheezes, or rhonchi. Chest is nontender. Heart has regular rate and rhythm without murmur. Abdomen is soft, flat, nontender without masses or hepatosplenomegaly and peristalsis is normoactive. Extremities have no cyanosis or edema, full range of motion is present.  AV fistula present in the left upper arm with thrill present. Skin is warm and dry without rash. Neurologic: Awake but slow to answer questions.  Speech is slightly slurred.  Objectively oriented to person, place, time.  Cranial nerves are intact.  Motor strength is 5/5 in all 4 extremities .  ED Results / Procedures / Treatments   Labs (all labs ordered are listed, but only abnormal results are displayed) Labs Reviewed  BASIC METABOLIC PANEL - Abnormal; Notable for the following components:      Result Value   Sodium 128 (*)    Chloride 88 (*)    BUN 23 (*)    Creatinine, Ser 8.16 (*)    GFR, Estimated 6 (*)    Anion gap 16 (*)    All other components within normal limits  CBC WITH DIFFERENTIAL/PLATELET - Abnormal; Notable for the following components:   Hemoglobin 11.8 (*)    HCT 34.6 (*)    RDW 17.4 (*)    All other components within normal limits  I-STAT CHEM 8, ED - Abnormal; Notable for the following components:   Sodium 127 (*)    Chloride 92 (*)    BUN 23 (*)    Creatinine, Ser 9.30 (*)    All other components within normal limits  ETHANOL  MAGNESIUM  I-STAT BETA HCG BLOOD, ED ([facility], [facility], [facility] ONLY)  TROPONIN I (HIGH SENSITIVITY)    EKG EKG Interpretation  Date/Time:  Monday May 17 2021 00:58:48 EDT Ventricular Rate:  94 PR Interval:  147 QRS Duration: 100 QT Interval:  358 QTC Calculation: 448 R Axis:   40 Text Interpretation: Sinus rhythm Early repolarization When compared with ECG of 04/06/2021, No significant change was found Confirmed by Delora Fuel ([id number]) on 05/17/2021 1:18:37 AM  Radiology CT  Head Wo Contrast  Result Date: 05/17/2021 CLINICAL DATA:  Trauma. EXAM: CT HEAD WITHOUT CONTRAST TECHNIQUE: Contiguous axial images were obtained from the base of the skull through the vertex without intravenous contrast. RADIATION DOSE REDUCTION: This exam was performed according to the departmental dose-optimization program which includes automated exposure control, adjustment of the mA and/or kV according to patient size and/or use of iterative reconstruction technique. COMPARISON:  Head CT dated 08/28/2019. FINDINGS: Brain: The ventricles and sulci are appropriate size for the patient's age. The gray-white matter discrimination is preserved. There is no acute intracranial hemorrhage. No mass effect or midline shift. No extra-axial fluid collection. Vascular: No hyperdense vessel or unexpected calcification. Skull: Normal. Negative for fracture or focal lesion. Sinuses/Orbits: No acute finding. Other: None IMPRESSION: Unremarkable noncontrast CT of the brain.  Electronically Signed   By: Anner Crete M.D.   On: 05/17/2021 02:07    Procedures .Marland KitchenLaceration Repair  Date/Time: 05/17/2021 2:34 AM Performed by: Delora Fuel, MD Authorized by: Delora Fuel, MD   Consent:    Consent obtained:  Verbal   Consent given by:  Patient   Risks, benefits, and alternatives were discussed: yes     Risks discussed:  Infection, pain and poor cosmetic result   Alternatives discussed:  No treatment Universal protocol:    Procedure explained and questions answered to patient or proxy's satisfaction: yes     Relevant documents present and verified: yes     Test results available: yes     Imaging studies available: yes     Required blood products, implants, devices, and special equipment available: yes     Site/side marked: yes     Immediately prior to procedure, a time out was called: yes     Patient identity confirmed:  Verbally with patient and arm band Anesthesia:    Anesthesia method:  None Laceration  details:    Location:  Scalp   Scalp location:  Frontal   Length (cm):  2.5   Depth (mm):  3 Pre-procedure details:    Preparation:  Patient was prepped and draped in usual sterile fashion and imaging obtained to evaluate for foreign bodies Exploration:    Hemostasis achieved with:  Direct pressure   Imaging obtained: x-ray     Imaging outcome: foreign body not noted     Wound exploration: entire depth of wound visualized     Wound extent: no foreign bodies/material noted     Contaminated: no   Treatment:    Area cleansed with:  Saline   Amount of cleaning:  Standard   Debridement:  None   Undermining:  None   Scar revision: no   Skin repair:    Repair method:  Staples   Number of staples:  4 Approximation:    Approximation:  Close Repair type:    Repair type:  Simple Post-procedure details:    Dressing:  Open (no dressing)   Procedure completion:  Tolerated well, no immediate complications  Cardiac monitor shows normal sinus rhythm, per my interpretation.    Medications Ordered in ED Medications  Tdap (BOOSTRIX) injection 0.5 mL (has no administration in time range)    ED Course/ Medical Decision Making/ A&P                           Medical Decision Making Amount and/or Complexity of Data Reviewed Labs: ordered. Radiology: ordered.  Risk Prescription drug management.   Fall with scalp laceration and slurred speech concerning for possible intracranial injury/concussion.  Altered mental status which is reported to have predated her fall.  Work-up is ordered including CBC, basic metabolic panel, ethanol level, magnesium and she is being sent for CT of head to look for evidence of intracranial injury.  Old records are reviewed, no record of tetanus immunization is found so Tdap booster is given.  ECG is obtained to look for evidence of arrhythmia, shows evidence of early repolarization but no other changes.  CT of head shows no acute injury, images independently  viewed by myself and I agree with radiologist interpretation.  Electrolyte panel shows mild hypokalemia which is not felt to be clinically significant, borderline elevated anion gap related to renal failure.  BUN and creatinine are at about her baseline.  Mild anemia is present which  is improved over baseline and felt to be secondary to chronic kidney disease.  Ethanol level is not detectable.  Patient was reevaluated, speech is improved and appears to be back at baseline.  Staples were applied to the laceration, will ambulate patient to see if she is stable for discharge.  Patient ambulated without difficulty.  She is felt to be safe for discharge.  She is scheduled for dialysis later today, encouraged to keep that appointment.  Staple removal will need to be done in 7 days.        Final Clinical Impression(s) / ED Diagnoses Final diagnoses:  Fall at home, initial encounter  End-stage renal disease on hemodialysis [facility])    Rx / DC Orders ED Discharge Orders     None         Delora Fuel, MD 81/85/90 [phone]

## 2021-05-17 NOTE — ED Notes (Signed)
Spoke with patient before time of discharge . MD Roxanne Mins show pt right at the time of discharge and agreed patient is okay to be discharge. Pt spoke with family on the phone at the time of discharge. Pt reports she will order a uber home.  ?

## 2021-05-17 NOTE — ED Provider Triage Note (Signed)
Emergency Medicine Provider Triage Evaluation Note ? ?Latasha Drake , a 41 y.o. female  was evaluated in triage.  Pt complains of ams, boyfriend at bedside states pt altered today and he found CBD gummies in the home. Pt admits to eating 3 gummies today. Denies etoh use or other drug use. Does not report any other coingestions and denies si. Seen yesterday for fall and head trauma with neg ct ? ?Review of Systems  ?Positive: AMS ?Negative: nvd ? ?Physical Exam  ?BP (!) 171/109 (BP Location: Right Arm)   Pulse 95   Temp 98.3 ?F (36.8 ?C) (Oral)   Resp 16   SpO2 99%  ?Gen:   Awake, no distress   ?Resp:  Normal effort  ?MSK:   Moves extremities without difficulty  ?Other:  Pt appears under the influence, cn II-XII intact, 5/5 strength to the bue/ble ? ?Medical Decision Making  ?Medically screening exam initiated at 5:45 PM.  Appropriate orders placed.  Latasha Drake was informed that the remainder of the evaluation will be completed by another provider, this initial triage assessment does not replace that evaluation, and the importance of remaining in the ED until their evaluation is complete. ? ? ?  ?Rodney Booze, PA-C ?05/17/21 1748 ? ?

## 2021-05-17 NOTE — ED Triage Notes (Signed)
Pt BIB EMS from home due to fall. Per family pt went into a dark bathroom and tripped and fell. Pt is confused on arrival. Per EMS they stated since this morning pt has seen altered. Pt receives dialysis but reports she has not been going like she is suppose to . ?

## 2021-05-17 NOTE — ED Provider Notes (Incomplete)
[facility] EMERGENCY DEPARTMENT Provider Note   CSN: [id number] Arrival date & time: 05/17/21  1644     History {Add pertinent medical, surgical, social history, OB history to HPI:1} Chief Complaint  Patient presents with   Altered Mental Status    Latasha Drake is a 41 y.o. female.  HPI Patient presents for evaluation of altered mental status.  She was at home with her boyfriend today when he saw her eating CBD Gummies.  She was here overnight with a fall injuring her head, and had comprehensive evaluation with laceration repair.  Today she has continued to exhibit abnormal behavior, that started yesterday.  She was unable to sleep last night.  She has been wandering in and out of the house and tried to leave the house today without appropriate attire.  She occasionally has what appears to be involuntary muscle control according to her boyfriend.  She has not had a witnessed seizure.    Home Medications Prior to Admission medications   Medication Sig Start Date End Date Taking? Authorizing Provider  amLODipine (NORVASC) 10 MG tablet Take one tablet (10 mg) by mouth in the morning on non-dialysis days - Sunday, Monday, Wednesday, Friday; take one tablet (10 mg) daily at bedtime 03/15/21   Bhagat, Crista Luria, PA  aspirin EC 81 MG tablet Take 1 tablet (81 mg total) by mouth daily. Swallow whole. 03/15/21   Bhagat, Crista Luria, PA  atorvastatin (LIPITOR) 40 MG tablet Take 1 tablet (40 mg total) by mouth daily. 03/16/21   Bhagat, Crista Luria, PA  citalopram (CELEXA) 20 MG tablet Take 20 mg by mouth at bedtime. 11/28/20   [provider]  clotrimazole (LOTRIMIN) 1 % external solution See admin instructions. Instill 5 drops into right ear twice daily until healed 03/10/21   [provider]  clotrimazole-betamethasone (LOTRISONE) cream See admin instructions. Apply topically to right ear twice daily until healed 03/10/21   [provider]  cyclobenzaprine  (FLEXERIL) 10 MG tablet Take 10 mg by mouth 3 (three) times daily as needed for muscle spasms.  04/03/19   [provider]  pantoprazole (PROTONIX) 40 MG tablet Take 40 mg by mouth daily as needed (indigestion).  10/27/16   [provider]  paragard intrauterine copper IUD IUD 1 Intra Uterine Device by Intrauterine route once.    [provider]  pregabalin (LYRICA) 150 MG capsule Take 150-300 mg by mouth See admin instructions. Take one capsule (150 mg) by mouth every morning with a 75 mg capsule (225 mg total); take two capsule (300 mg) with a 75 mg capsule (375 mg total) at night 09/16/20   [provider]  pregabalin (LYRICA) 75 MG capsule Take 75 mg by mouth 2 (two) times daily. Take with one 150 mg capsule in the morning (225 mg total) and with two 150 mg capsules at night (375 mg total) 03/08/21   [provider]  rOPINIRole (REQUIP) 0.5 MG tablet Take 0.5 mg by mouth 2 (two) times daily. 02/01/21   [provider]  sevelamer carbonate (RENVELA) 800 MG tablet Take 1,600 mg by mouth 2 (two) times daily with a meal. 08/26/20   [provider]      Allergies    Baclofen and Lisinopril    Review of Systems   Review of Systems  Physical Exam Updated Vital Signs BP (!) 174/109    Pulse 94    Temp 98.3 F (36.8 C) (Oral)    Resp (!) 22    SpO2  98%  Physical Exam  ED Results / Procedures / Treatments   Labs (all labs ordered are listed, but only abnormal results are displayed) Labs Reviewed  CBC WITH DIFFERENTIAL/PLATELET - Abnormal; Notable for the following components:      Result Value   HCT 34.7 (*)    RDW 17.2 (*)    All other components within normal limits  COMPREHENSIVE METABOLIC PANEL - Abnormal; Notable for the following components:   Sodium 126 (*)    Potassium 5.6 (*)    Chloride 87 (*)    BUN 32 (*)    Creatinine, Ser 10.07 (*)    GFR, Estimated 5 (*)    Anion gap 17 (*)    All other components within normal  limits  RAPID URINE DRUG SCREEN, HOSP PERFORMED  ETHANOL    EKG EKG Interpretation  Date/Time:  Monday May 17 2021 20:10:36 EDT Ventricular Rate:  91 PR Interval:  156 QRS Duration: 97 QT Interval:  360 QTC Calculation: 443 R Axis:   79 Text Interpretation: Sinus rhythm Probable left atrial enlargement Low voltage, precordial leads Abnrm T, consider ischemia, anterolateral lds ST elev, probable normal early repol pattern since last tracing no significant change Confirmed by Daleen Bo [id number]) on 05/17/2021 9:48:34 PM  Radiology CT Head Wo Contrast  Result Date: 05/17/2021 CLINICAL DATA:  Altered level of consciousness, THC use, head trauma yesterday EXAM: CT HEAD WITHOUT CONTRAST TECHNIQUE: Contiguous axial images were obtained from the base of the skull through the vertex without intravenous contrast. RADIATION DOSE REDUCTION: This exam was performed according to the departmental dose-optimization program which includes automated exposure control, adjustment of the mA and/or kV according to patient size and/or use of iterative reconstruction technique. COMPARISON:  05/17/2021 at 2:07 a.m. FINDINGS: Brain: No acute infarct or hemorrhage. Lateral ventricles and midline structures are unremarkable. No acute extra-axial fluid collections. No mass effect. Vascular: No hyperdense vessel or unexpected calcification. Skull: Right frontal scalp hematoma and laceration, with skin staples in place. No underlying fracture. Remainder of the calvarium is unremarkable. Sinuses/Orbits: No acute finding. Other: None. IMPRESSION: 1. Right frontal scalp hematoma and laceration with skin staples in place. 2. No acute intracranial process. Electronically Signed   By: Randa Ngo M.D.   On: 05/17/2021 21:15   CT Head Wo Contrast  Result Date: 05/17/2021 CLINICAL DATA:  Trauma. EXAM: CT HEAD WITHOUT CONTRAST TECHNIQUE: Contiguous axial images were obtained from the base of the skull through the vertex  without intravenous contrast. RADIATION DOSE REDUCTION: This exam was performed according to the departmental dose-optimization program which includes automated exposure control, adjustment of the mA and/or kV according to patient size and/or use of iterative reconstruction technique. COMPARISON:  Head CT dated 08/28/2019. FINDINGS: Brain: The ventricles and sulci are appropriate size for the patient's age. The gray-white matter discrimination is preserved. There is no acute intracranial hemorrhage. No mass effect or midline shift. No extra-axial fluid collection. Vascular: No hyperdense vessel or unexpected calcification. Skull: Normal. Negative for fracture or focal lesion. Sinuses/Orbits: No acute finding. Other: None IMPRESSION: Unremarkable noncontrast CT of the brain. Electronically Signed   By: Anner Crete M.D.   On: 05/17/2021 02:07    Procedures Procedures  {Document cardiac monitor, telemetry assessment procedure when appropriate:1}  Medications Ordered in ED Medications  sodium zirconium cyclosilicate (LOKELMA) packet 10 g (has no administration in time range)    ED Course/ Medical Decision Making/ A&P Clinical Course as of 05/17/21 2217  Mon May 17, 2021  2157 Case discussed with poison control who states that patient requires observation until she reaches her baseline. [EW]    Clinical Course User Index [EW] Daleen Bo, MD                           Medical Decision Making Patient presenting with ongoing encephalopathy since yesterday, preceding a fall.  Apparently the fall was caused by abnormal mental status.  The symptoms have continued today while she has been observed ingesting CBD tablets.  Her boyfriend with her states "the whole bottle was empty."  It is unknown how many she ingested.  These were apparently obtained somewhere in [place].  There were no other injuries today.  She is scheduled for dialysis tomorrow, her usual day.  She is known to smoke  marijuana but does not use other drugs or alcohol.  Problems Addressed: Encephalopathy: acute illness or injury    Details: Felt to be secondary to CBD ingestion which was observed today.  Possibly ingested a whole bottle. End stage renal disease [facility]): acute illness or injury    Details: She missed her dialysis today.  She has missed other dialysis treatments in the past.  She tends to manage her own treatments.  Amount and/or Complexity of Data Reviewed Independent Historian: caregiver    Details: History given by boyfriend at bedside.  She does not have a healthcare power of attorney. Labs: ordered.    Details: CBC, metabolic panel, alcohol level, urine drug screen ordered-initial findings normal except sodium low, potassium high, chloride low, BUN high, creatinine high, GFR low, anion gap elevated Radiology: ordered and independent interpretation performed.    Details: Head CT-no acute intracranial abnormality ECG/medicine tests: ordered and independent interpretation performed.    Details: Cardiac monitor and twelve-lead EKG, indicate peaked T waves.  No acute ischemic normality.  Possible peritonitis Discussion of management or test interpretation with external provider(s): Consultation nephrology regarding metabolic disorders and EKG showing peaked T waves.  Case discussed with Dr. Zenda Alpers, who recommends a dose of Lokelma; he does not feel that she needs dialysis tonight.  He would proceed with usual dialysis today, which is tomorrow.  Risk Risk Details: Patient unstable with encephalopathy and is a danger to herself.  She requires observation.  No known reversal agent for CBG ingestion.  Patient with metabolic abnormalities, and end-stage renal disease.  Elevated potassium likely needs correction, to prevent worsening before dialysis is available.  Patient requires observation for safety purposes.  Critical Care Total time providing critical care: 45 minutes    {Document critical  care time when appropriate:1} {Document review of labs and clinical decision tools ie heart score, Chads2Vasc2 etc:1}  {Document your independent review of radiology images, and any outside records:1} {Document your discussion with family members, caretakers, and with consultants:1} {Document social determinants of health affecting pt's care:1} {Document your decision making why or why not admission, treatments were needed:1} Final Clinical Impression(s) / ED Diagnoses Final diagnoses:  Encephalopathy  End stage renal disease ([place])  Hyperkalemia    Rx / DC Orders ED Discharge Orders     None

## 2021-05-18 LAB — RENAL FUNCTION PANEL
Albumin: 3.4 g/dL — ABNORMAL LOW (ref 3.5–5.0)
Anion gap: 16 — ABNORMAL HIGH (ref 5–15)
BUN: 37 mg/dL — ABNORMAL HIGH (ref 6–20)
CO2: 23 mmol/L (ref 22–32)
Calcium: 9.3 mg/dL (ref 8.9–10.3)
Chloride: 89 mmol/L — ABNORMAL LOW (ref 98–111)
Creatinine, Ser: 11.37 mg/dL — ABNORMAL HIGH (ref 0.44–1.00)
GFR, Estimated: 4 mL/min — ABNORMAL LOW (ref >60)
Glucose, Bld: 100 mg/dL — ABNORMAL HIGH (ref 70–99)
Phosphorus: 8.4 mg/dL — ABNORMAL HIGH (ref 2.5–4.6)
Potassium: 5 mmol/L (ref 3.5–5.1)
Sodium: 128 mmol/L — ABNORMAL LOW (ref 135–145)

## 2021-05-18 NOTE — ED Provider Notes (Signed)
Patient is sleeping but easily arousable.  She is ambulatory and able to tolerate fluids.  Feel she is safe for discharge home.  Discussed with her that she needs to avoid any illicit drug use in the future. ?Physical Exam  ?BP (!) 161/97 (BP Location: Right Arm)   Pulse 88   Temp 98.3 ?F (36.8 ?C) (Oral)   Resp 18   SpO2 99%  ? ?Physical Exam ?arousable, no acute distress ?Ambulatory independently ?Procedures  ?Procedures ? ?ED Course / MDM  ? ?Clinical Course as of 05/18/21 0648  ?Mon May 17, 2021  ?2157 Case discussed with poison control who states that patient requires observation until she reaches her baseline. [EW]  ?Tue May 18, 2021  ?0027 Patient is now more alert and communicative.  She sits up to talk and states she came here because her legs have been restless.  She is not exhibiting the involuntary muscle movement of her upper extremities at this time.  She is still sleepy and laid down while I was talking to her and fell asleep.  She requires continue observation for metabolism from suspected CBD intoxication [EW]  ?0647 On recheck, patient remains somnolent but is arousable and able to answer questions appropriately.  Per nursing, she has been ambulatory without difficulty to the bathroom and has been able to eat.  She has a ride and will be discharged. [CH]  ?  ?Clinical Course User Index ?[CH] Doren Kaspar, Barbette Hair, MD ?[EW] Daleen Bo, MD  ? ?Medical Decision Making ? ?Problem List Items Addressed This Visit   ?None ?Visit Diagnoses   ? ? Encephalopathy    -  Primary  ? End stage renal disease ([place])      ? Hyperkalemia      ? ?  ?  ? ? ? ? ?  ?Merryl Hacker, MD ?05/18/21 [phone] ? ?

## 2021-05-18 NOTE — ED Notes (Signed)
Pt provided with drink and food which she ate without issue and out of bed to throw away trash, returning to bed without issue. Pt still appears mildly confused, but retaining instructions and following directions.  ?

## 2021-05-18 NOTE — ED Notes (Signed)
Pt out of bed, requesting directions to the bathroom, conversation appropriate. Pt ambulated to RR and back to bed without assistance and is now resting comfortably at this time.  ? ?

## 2021-05-18 NOTE — Discharge Instructions (Addendum)
You were seen today for altered mental status.  This is likely related to acute intoxication from CBD.  This in addition to your renal dysfunction is likely culprit.  Avoid alcohol or drug use in the future.  You need to make sure to go to dialysis as scheduled. ?

## 2021-05-19 ENCOUNTER — Emergency Department (HOSPITAL_COMMUNITY): Payer: Medicare Other

## 2021-05-19 ENCOUNTER — Inpatient Hospital Stay (HOSPITAL_COMMUNITY): Payer: Medicare Other

## 2021-05-19 ENCOUNTER — Inpatient Hospital Stay (HOSPITAL_COMMUNITY)
Admission: EM | Admit: 2021-05-19 | Discharge: 2021-05-21 | DRG: 917 | Disposition: A | Discharge status: Home / Self Care | Payer: Medicare Other | Attending: Pulmonary Disease | Admitting: Pulmonary Disease

## 2021-05-19 DIAGNOSIS — I2510 Atherosclerotic heart disease of native coronary artery without angina pectoris: Secondary | ICD-10-CM | POA: Diagnosis present

## 2021-05-19 DIAGNOSIS — Z23 Encounter for immunization: Secondary | ICD-10-CM | POA: Diagnosis present

## 2021-05-19 DIAGNOSIS — Z992 Dependence on renal dialysis: Secondary | ICD-10-CM | POA: Diagnosis not present

## 2021-05-19 DIAGNOSIS — Z7982 Long term (current) use of aspirin: Secondary | ICD-10-CM | POA: Diagnosis not present

## 2021-05-19 DIAGNOSIS — Z9115 Patient's noncompliance with renal dialysis: Secondary | ICD-10-CM

## 2021-05-19 DIAGNOSIS — N186 End stage renal disease: Secondary | ICD-10-CM | POA: Diagnosis present

## 2021-05-19 DIAGNOSIS — J9601 Acute respiratory failure with hypoxia: Secondary | ICD-10-CM | POA: Diagnosis present

## 2021-05-19 DIAGNOSIS — E8770 Fluid overload, unspecified: Secondary | ICD-10-CM | POA: Diagnosis present

## 2021-05-19 DIAGNOSIS — R4182 Altered mental status, unspecified: Secondary | ICD-10-CM | POA: Diagnosis not present

## 2021-05-19 DIAGNOSIS — I120 Hypertensive chronic kidney disease with stage 5 chronic kidney disease or end stage renal disease: Secondary | ICD-10-CM | POA: Diagnosis present

## 2021-05-19 DIAGNOSIS — N2581 Secondary hyperparathyroidism of renal origin: Secondary | ICD-10-CM | POA: Diagnosis present

## 2021-05-19 DIAGNOSIS — Z4659 Encounter for fitting and adjustment of other gastrointestinal appliance and device: Secondary | ICD-10-CM

## 2021-05-19 DIAGNOSIS — S0101XA Laceration without foreign body of scalp, initial encounter: Secondary | ICD-10-CM | POA: Diagnosis present

## 2021-05-19 DIAGNOSIS — R627 Adult failure to thrive: Secondary | ICD-10-CM | POA: Diagnosis present

## 2021-05-19 DIAGNOSIS — Z79899 Other long term (current) drug therapy: Secondary | ICD-10-CM

## 2021-05-19 DIAGNOSIS — I272 Pulmonary hypertension, unspecified: Secondary | ICD-10-CM | POA: Diagnosis present

## 2021-05-19 DIAGNOSIS — Z975 Presence of (intrauterine) contraceptive device: Secondary | ICD-10-CM

## 2021-05-19 DIAGNOSIS — J9602 Acute respiratory failure with hypercapnia: Secondary | ICD-10-CM | POA: Diagnosis present

## 2021-05-19 DIAGNOSIS — D631 Anemia in chronic kidney disease: Secondary | ICD-10-CM | POA: Diagnosis present

## 2021-05-19 DIAGNOSIS — E875 Hyperkalemia: Secondary | ICD-10-CM

## 2021-05-19 DIAGNOSIS — F1910 Other psychoactive substance abuse, uncomplicated: Secondary | ICD-10-CM

## 2021-05-19 DIAGNOSIS — Z6832 Body mass index (BMI) 32.0-32.9, adult: Secondary | ICD-10-CM

## 2021-05-19 DIAGNOSIS — W010XXA Fall on same level from slipping, tripping and stumbling without subsequent striking against object, initial encounter: Secondary | ICD-10-CM | POA: Diagnosis present

## 2021-05-19 DIAGNOSIS — Z20822 Contact with and (suspected) exposure to covid-19: Secondary | ICD-10-CM | POA: Diagnosis present

## 2021-05-19 DIAGNOSIS — Y92009 Unspecified place in unspecified non-institutional (private) residence as the place of occurrence of the external cause: Secondary | ICD-10-CM

## 2021-05-19 DIAGNOSIS — M3214 Glomerular disease in systemic lupus erythematosus: Secondary | ICD-10-CM | POA: Diagnosis present

## 2021-05-19 DIAGNOSIS — Z888 Allergy status to other drugs, medicaments and biological substances status: Secondary | ICD-10-CM | POA: Diagnosis not present

## 2021-05-19 DIAGNOSIS — F17210 Nicotine dependence, cigarettes, uncomplicated: Secondary | ICD-10-CM | POA: Diagnosis present

## 2021-05-19 DIAGNOSIS — T40411A Poisoning by fentanyl or fentanyl analogs, accidental (unintentional), initial encounter: Secondary | ICD-10-CM | POA: Diagnosis present

## 2021-05-19 DIAGNOSIS — G928 Other toxic encephalopathy: Secondary | ICD-10-CM | POA: Diagnosis present

## 2021-05-19 DIAGNOSIS — J9600 Acute respiratory failure, unspecified whether with hypoxia or hypercapnia: Secondary | ICD-10-CM

## 2021-05-19 DIAGNOSIS — E876 Hypokalemia: Secondary | ICD-10-CM | POA: Diagnosis present

## 2021-05-19 DIAGNOSIS — E669 Obesity, unspecified: Secondary | ICD-10-CM | POA: Diagnosis present

## 2021-05-19 DIAGNOSIS — E785 Hyperlipidemia, unspecified: Secondary | ICD-10-CM | POA: Diagnosis present

## 2021-05-19 DIAGNOSIS — I160 Hypertensive urgency: Secondary | ICD-10-CM | POA: Diagnosis present

## 2021-05-19 LAB — I-STAT CHEM 8, ED
BUN: 67 mg/dL — ABNORMAL HIGH (ref 6–20)
Calcium, Ion: 1.02 mmol/L — ABNORMAL LOW (ref 1.15–1.40)
Chloride: 93 mmol/L — ABNORMAL LOW (ref 98–111)
Creatinine, Ser: 13.9 mg/dL — ABNORMAL HIGH (ref 0.44–1.00)
Glucose, Bld: 99 mg/dL (ref 70–99)
HCT: 38 % (ref 36.0–46.0)
Hemoglobin: 12.9 g/dL (ref 12.0–15.0)
Potassium: 7.3 mmol/L (ref 3.5–5.1)
Sodium: 121 mmol/L — ABNORMAL LOW (ref 135–145)
TCO2: 23 mmol/L (ref 22–32)

## 2021-05-19 LAB — COMPREHENSIVE METABOLIC PANEL
ALT: 14 U/L (ref 0–44)
ALT: 17 U/L (ref 0–44)
AST: 40 U/L (ref 15–41)
AST: 31 U/L (ref 15–41)
Albumin: 3.9 g/dL (ref 3.5–5.0)
Albumin: 3.8 g/dL (ref 3.5–5.0)
Alkaline Phosphatase: 70 U/L (ref 38–126)
Alkaline Phosphatase: 70 U/L (ref 38–126)
Anion gap: 20 — ABNORMAL HIGH (ref 5–15)
Anion gap: 18 — ABNORMAL HIGH (ref 5–15)
BUN: 49 mg/dL — ABNORMAL HIGH (ref 6–20)
BUN: 50 mg/dL — ABNORMAL HIGH (ref 6–20)
CO2: 18 mmol/L — ABNORMAL LOW (ref 22–32)
CO2: 24 mmol/L (ref 22–32)
Calcium: 9.2 mg/dL (ref 8.9–10.3)
Calcium: 9.7 mg/dL (ref 8.9–10.3)
Chloride: 86 mmol/L — ABNORMAL LOW (ref 98–111)
Chloride: 83 mmol/L — ABNORMAL LOW (ref 98–111)
Creatinine, Ser: 12.52 mg/dL — ABNORMAL HIGH (ref 0.44–1.00)
Creatinine, Ser: 12.24 mg/dL — ABNORMAL HIGH (ref 0.44–1.00)
GFR, Estimated: 3 mL/min — ABNORMAL LOW (ref >60)
GFR, Estimated: 4 mL/min — ABNORMAL LOW (ref >60)
Glucose, Bld: 102 mg/dL — ABNORMAL HIGH (ref 70–99)
Glucose, Bld: 95 mg/dL (ref 70–99)
Potassium: 7.4 mmol/L (ref 3.5–5.1)
Potassium: 6.8 mmol/L (ref 3.5–5.1)
Sodium: 124 mmol/L — ABNORMAL LOW (ref 135–145)
Sodium: 125 mmol/L — ABNORMAL LOW (ref 135–145)
Total Bilirubin: 0.8 mg/dL (ref 0.3–1.2)
Total Bilirubin: 0.6 mg/dL (ref 0.3–1.2)
Total Protein: 6.6 g/dL (ref 6.5–8.1)
Total Protein: 6.5 g/dL (ref 6.5–8.1)

## 2021-05-19 LAB — I-STAT ARTERIAL BLOOD GAS, ED
Acid-Base Excess: 8 mmol/L — ABNORMAL HIGH (ref 0.0–2.0)
Bicarbonate: 33.1 mmol/L — ABNORMAL HIGH (ref 20.0–28.0)
Calcium, Ion: 1.09 mmol/L — ABNORMAL LOW (ref 1.15–1.40)
HCT: 34 % — ABNORMAL LOW (ref 36.0–46.0)
Hemoglobin: 11.6 g/dL — ABNORMAL LOW (ref 12.0–15.0)
O2 Saturation: 98 %
Potassium: 6.8 mmol/L (ref 3.5–5.1)
Sodium: 124 mmol/L — ABNORMAL LOW (ref 135–145)
TCO2: 35 mmol/L — ABNORMAL HIGH (ref 22–32)
pCO2 arterial: 49.5 mmHg — ABNORMAL HIGH (ref 32–48)
pH, Arterial: 7.434 (ref 7.35–7.45)
pO2, Arterial: 96 mmHg (ref 83–108)

## 2021-05-19 LAB — PROTIME-INR
INR: 1 (ref 0.8–1.2)
Prothrombin Time: 12.9 seconds (ref 11.4–15.2)

## 2021-05-19 LAB — CBC WITH DIFFERENTIAL/PLATELET
Abs Immature Granulocytes: 0.03 10*3/uL (ref 0.00–0.07)
Basophils Absolute: 0 10*3/uL (ref 0.0–0.1)
Basophils Relative: 0 %
Eosinophils Absolute: 0.2 10*3/uL (ref 0.0–0.5)
Eosinophils Relative: 2 %
HCT: 33.3 % — ABNORMAL LOW (ref 36.0–46.0)
Hemoglobin: 11.7 g/dL — ABNORMAL LOW (ref 12.0–15.0)
Immature Granulocytes: 0 %
Lymphocytes Relative: 20 %
Lymphs Abs: 1.6 10*3/uL (ref 0.7–4.0)
MCH: 28.7 pg (ref 26.0–34.0)
MCHC: 35.1 g/dL (ref 30.0–36.0)
MCV: 81.6 fL (ref 80.0–100.0)
Monocytes Absolute: 0.8 10*3/uL (ref 0.1–1.0)
Monocytes Relative: 10 %
Neutro Abs: 5.2 10*3/uL (ref 1.7–7.7)
Neutrophils Relative %: 68 %
Platelets: 234 10*3/uL (ref 150–400)
RBC: 4.08 MIL/uL (ref 3.87–5.11)
RDW: 17 % — ABNORMAL HIGH (ref 11.5–15.5)
WBC: 7.8 10*3/uL (ref 4.0–10.5)
nRBC: 0 % (ref 0.0–0.2)

## 2021-05-19 LAB — AMYLASE: Amylase: 68 U/L (ref 28–100)

## 2021-05-19 LAB — GLUCOSE, CAPILLARY
Glucose-Capillary: 80 mg/dL (ref 70–99)
Glucose-Capillary: 106 mg/dL — ABNORMAL HIGH (ref 70–99)
Glucose-Capillary: 92 mg/dL (ref 70–99)
Glucose-Capillary: 123 mg/dL — ABNORMAL HIGH (ref 70–99)

## 2021-05-19 LAB — RESP PANEL BY RT-PCR (FLU A&B, COVID) ARPGX2
Influenza A by PCR: NEGATIVE
Influenza B by PCR: NEGATIVE
SARS Coronavirus 2 by RT PCR: NEGATIVE

## 2021-05-19 LAB — LACTIC ACID, PLASMA
Lactic Acid, Venous: 1.3 mmol/L (ref 0.5–1.9)
Lactic Acid, Venous: 1 mmol/L (ref 0.5–1.9)

## 2021-05-19 LAB — LIPASE, BLOOD: Lipase: 38 U/L (ref 11–51)

## 2021-05-19 LAB — MRSA NEXT GEN BY PCR, NASAL: MRSA by PCR Next Gen: NOT DETECTED

## 2021-05-19 LAB — CBG MONITORING, ED: Glucose-Capillary: 104 mg/dL — ABNORMAL HIGH (ref 70–99)

## 2021-05-19 LAB — POTASSIUM: Potassium: 4 mmol/L (ref 3.5–5.1)

## 2021-05-19 LAB — APTT: aPTT: 62 seconds — ABNORMAL HIGH (ref 24–36)

## 2021-05-19 LAB — HIV ANTIBODY (ROUTINE TESTING W REFLEX): HIV Screen 4th Generation wRfx: NONREACTIVE

## 2021-05-19 LAB — HEPATITIS B SURFACE ANTIGEN: Hepatitis B Surface Ag: NONREACTIVE

## 2021-05-19 LAB — TSH: TSH: 0.477 u[IU]/mL (ref 0.350–4.500)

## 2021-05-19 LAB — MAGNESIUM: Magnesium: 2.7 mg/dL — ABNORMAL HIGH (ref 1.7–2.4)

## 2021-05-19 LAB — PROCALCITONIN: Procalcitonin: 0.15 ng/mL

## 2021-05-19 LAB — ETHANOL: Alcohol, Ethyl (B): <10 mg/dL (ref <10)

## 2021-05-19 LAB — PHOSPHORUS: Phosphorus: 9.7 mg/dL — ABNORMAL HIGH (ref 2.5–4.6)

## 2021-05-19 LAB — CORTISOL: Cortisol, Plasma: 20.5 ug/dL

## 2021-05-19 MED ORDER — FOLIC ACID 1 MG PO TABS
1.0000 mg | ORAL_TABLET | Freq: Every day | ORAL | Status: DC
  Administered 2021-05-19 – 2021-05-21 (×3): 1 mg
  Filled 2021-05-19 (×3): qty 1

## 2021-05-19 MED ORDER — ORAL CARE MOUTH RINSE
15.0000 mL | OROMUCOSAL | Status: DC
  Administered 2021-05-19 – 2021-05-20 (×10): 15 mL via OROMUCOSAL

## 2021-05-19 MED ORDER — FOLIC ACID 5 MG/ML IJ SOLN
1.0000 mg | Freq: Every day | INTRAMUSCULAR | Status: DC
  Filled 2021-05-19: qty 0.2

## 2021-05-19 MED ORDER — SODIUM BICARBONATE 8.4 % IV SOLN
50.0000 meq | Freq: Once | INTRAVENOUS | Status: AC
  Administered 2021-05-19: 50 meq via INTRAVENOUS
  Filled 2021-05-19: qty 50

## 2021-05-19 MED ORDER — HEPARIN SODIUM (PORCINE) 5000 UNIT/ML IJ SOLN
5000.0000 [IU] | Freq: Three times a day (TID) | INTRAMUSCULAR | Status: DC
  Administered 2021-05-19 – 2021-05-21 (×6): 5000 [IU] via SUBCUTANEOUS
  Filled 2021-05-19 (×6): qty 1

## 2021-05-19 MED ORDER — PROSOURCE TF PO LIQD
45.0000 mL | Freq: Every day | ORAL | Status: DC
  Administered 2021-05-19 – 2021-05-20 (×6): 45 mL
  Filled 2021-05-19 (×6): qty 45

## 2021-05-19 MED ORDER — FENTANYL CITRATE PF 50 MCG/ML IJ SOSY: 100.0000 ug | PREFILLED_SYRINGE | INTRAMUSCULAR | Status: DC | PRN

## 2021-05-19 MED ORDER — LORAZEPAM 2 MG/ML IJ SOLN
1.0000 mg | Freq: Once | INTRAMUSCULAR | Status: DC
Start: 2021-05-19 — End: 2021-05-19
  Filled 2021-05-19: qty 1

## 2021-05-19 MED ORDER — DEXTROSE 50 % IV SOLN
1.0000 | Freq: Once | INTRAVENOUS | Status: AC
  Administered 2021-05-19: 50 mL via INTRAVENOUS
  Filled 2021-05-19: qty 50

## 2021-05-19 MED ORDER — POLYETHYLENE GLYCOL 3350 17 G PO PACK: 17.0000 g | PACK | Freq: Every day | ORAL | Status: DC | PRN

## 2021-05-19 MED ORDER — SODIUM BICARBONATE 8.4 % IV SOLN
INTRAVENOUS | Status: AC
  Filled 2021-05-19: qty 50

## 2021-05-19 MED ORDER — PROPOFOL 1000 MG/100ML IV EMUL
INTRAVENOUS | Status: AC
  Administered 2021-05-19: 5 ug/kg/min via INTRAVENOUS
  Filled 2021-05-19: qty 100

## 2021-05-19 MED ORDER — FENTANYL CITRATE (PF) 100 MCG/2ML IJ SOLN: 50.0000 ug | Freq: Once | INTRAMUSCULAR | Status: DC

## 2021-05-19 MED ORDER — ETOMIDATE 2 MG/ML IV SOLN
INTRAVENOUS | Status: AC | PRN
  Administered 2021-05-19: 30 mg via INTRAVENOUS
  Administered 2021-05-19: 20 mg via INTRAVENOUS

## 2021-05-19 MED ORDER — ALBUTEROL SULFATE (2.5 MG/3ML) 0.083% IN NEBU
10.0000 mg | INHALATION_SOLUTION | Freq: Once | RESPIRATORY_TRACT | Status: AC
  Filled 2021-05-19: qty 12

## 2021-05-19 MED ORDER — CALCIUM GLUCONATE 10 % IV SOLN
1.0000 g | Freq: Once | INTRAVENOUS | Status: AC
  Administered 2021-05-19: 1 g via INTRAVENOUS
  Filled 2021-05-19: qty 10

## 2021-05-19 MED ORDER — FENTANYL BOLUS VIA INFUSION
50.0000 ug | INTRAVENOUS | Status: DC | PRN
  Administered 2021-05-19 – 2021-05-20 (×2): 50 ug via INTRAVENOUS
  Filled 2021-05-19: qty 100

## 2021-05-19 MED ORDER — THIAMINE HCL 100 MG PO TABS
100.0000 mg | ORAL_TABLET | Freq: Every day | ORAL | Status: DC
  Administered 2021-05-20 – 2021-05-21 (×2): 100 mg
  Filled 2021-05-19 (×2): qty 1

## 2021-05-19 MED ORDER — LORAZEPAM 2 MG/ML IJ SOLN
1.0000 mg | Freq: Once | INTRAMUSCULAR | Status: DC
Start: 2021-05-19 — End: 2021-05-19

## 2021-05-19 MED ORDER — PANTOPRAZOLE SODIUM 40 MG IV SOLR: 40.0000 mg | Freq: Every day | INTRAVENOUS | Status: DC

## 2021-05-19 MED ORDER — SODIUM BICARBONATE 8.4 % IV SOLN
100.0000 meq | Freq: Once | INTRAVENOUS | Status: AC
Start: 2021-05-19 — End: 2021-05-19
  Administered 2021-05-19: 100 meq via INTRAVENOUS

## 2021-05-19 MED ORDER — PANTOPRAZOLE 2 MG/ML SUSPENSION
40.0000 mg | Freq: Every day | ORAL | Status: DC
  Administered 2021-05-19 – 2021-05-21 (×3): 40 mg
  Filled 2021-05-19 (×3): qty 20

## 2021-05-19 MED ORDER — ALBUTEROL SULFATE (2.5 MG/3ML) 0.083% IN NEBU
INHALATION_SOLUTION | RESPIRATORY_TRACT | Status: AC
  Administered 2021-05-19: 10 mg via RESPIRATORY_TRACT
  Filled 2021-05-19: qty 12

## 2021-05-19 MED ORDER — VITAL 1.5 CAL PO LIQD
1000.0000 mL | ORAL | Status: DC
  Administered 2021-05-19: 1000 mL

## 2021-05-19 MED ORDER — INSULIN ASPART 100 UNIT/ML IV SOLN
5.0000 [IU] | Freq: Once | INTRAVENOUS | Status: AC
  Administered 2021-05-19: 5 [IU] via INTRAVENOUS

## 2021-05-19 MED ORDER — FENTANYL 2500MCG IN NS 250ML (10MCG/ML) PREMIX INFUSION
50.0000 ug/h | INTRAVENOUS | Status: DC
  Administered 2021-05-19: 150 ug/h via INTRAVENOUS
  Administered 2021-05-20: 125 ug/h via INTRAVENOUS
  Filled 2021-05-19 (×2): qty 250

## 2021-05-19 MED ORDER — ROCURONIUM BROMIDE 50 MG/5ML IV SOLN
INTRAVENOUS | Status: AC | PRN
Start: 2021-05-19 — End: 2021-05-19
  Administered 2021-05-19: 100 mg via INTRAVENOUS

## 2021-05-19 MED ORDER — THIAMINE HCL 100 MG/ML IJ SOLN
100.0000 mg | Freq: Every day | INTRAMUSCULAR | Status: DC
  Administered 2021-05-19: 100 mg via INTRAVENOUS
  Filled 2021-05-19: qty 2

## 2021-05-19 MED ORDER — SODIUM CHLORIDE 0.9 % IV SOLN: INTRAVENOUS | Status: DC

## 2021-05-19 MED ORDER — DOCUSATE SODIUM 50 MG/5ML PO LIQD
100.0000 mg | Freq: Two times a day (BID) | ORAL | Status: DC | PRN
  Filled 2021-05-19: qty 10

## 2021-05-19 MED ORDER — CHLORHEXIDINE GLUCONATE CLOTH 2 % EX PADS
6.0000 | MEDICATED_PAD | Freq: Every day | CUTANEOUS | Status: DC
  Administered 2021-05-19 – 2021-05-20 (×2): 6 via TOPICAL

## 2021-05-19 MED ORDER — ROCURONIUM BROMIDE 10 MG/ML (PF) SYRINGE
PREFILLED_SYRINGE | INTRAVENOUS | Status: AC
  Filled 2021-05-19: qty 10

## 2021-05-19 MED ORDER — ETOMIDATE 2 MG/ML IV SOLN
INTRAVENOUS | Status: AC
  Filled 2021-05-19: qty 10

## 2021-05-19 MED ORDER — ETOMIDATE 2 MG/ML IV SOLN
INTRAVENOUS | Status: AC
  Filled 2021-05-19: qty 20

## 2021-05-19 MED ORDER — CHLORHEXIDINE GLUCONATE 0.12% ORAL RINSE (MEDLINE KIT)
15.0000 mL | Freq: Two times a day (BID) | OROMUCOSAL | Status: DC
  Administered 2021-05-19 – 2021-05-20 (×2): 15 mL via OROMUCOSAL

## 2021-05-19 MED ORDER — PROPOFOL 1000 MG/100ML IV EMUL
0.0000 ug/kg/min | INTRAVENOUS | Status: DC
  Administered 2021-05-19 – 2021-05-20 (×3): 20 ug/kg/min via INTRAVENOUS
  Filled 2021-05-19 (×3): qty 100

## 2021-05-19 NOTE — Progress Notes (Signed)
Initial Nutrition Assessment ? ?DOCUMENTATION CODES:  ? ?Not applicable ? ?INTERVENTION:  ? ?Initiate tube feeding via OG tube: ?Vital 1.5 at 40 ml/h (960 ml per day) ?Prosource TF 45 ml 5 times per day ? ?Provides 1640 kcal (2160 kcal total with propofol), 120 gm protein, 733 ml free water daily. ? ?Renal MVI daily via tube.  ? ?NUTRITION DIAGNOSIS:  ? ?Inadequate oral intake related to inability to eat as evidenced by NPO status. ? ?GOAL:  ? ?Patient will meet greater than or equal to 90% of their needs ? ?MONITOR:  ? ?Vent status, Labs, TF tolerance ? ?REASON FOR ASSESSMENT:  ? ?Ventilator, Consult ?Enteral/tube feeding initiation and management ? ?ASSESSMENT:  ? ?41 yo female admitted with AMS, respiratory failure, hyperkalemia. PMH includes ESRD on HD, polysubstance abuse, HTN, lupus, smoker. ? ?Discussed patient in ICU rounds and with RN today. ?Potassium elevated this morning. ?OG tube in place. ?Per discussion with admitting physician, okay to begin TF. ? ?Patient is currently intubated on ventilator support ?MV: 12.9 L/min ?Temp (24hrs), Avg:98.1 ?F (36.7 ?C), Min:97.5 ?F (36.4 ?C), Max:98.9 ?F (37.2 ?C) ? ?Propofol: 19.7 ml/hr providing 520 kcal from lipid. ? ?EDW 79 kg ?Current weight 83 kg ? ?Labs reviewed. Na 125, K 6.8, ionized calcium 1.09, phos 9.7, mag 2.7 ?CBG: 104-80-106 ? ?Medications reviewed and include folic acid, thiamine, propofol. ? ? ?NUTRITION - FOCUSED PHYSICAL EXAM: ? ?Flowsheet Row Most Recent Value  ?Orbital Region No depletion  ?Upper Arm Region No depletion  ?Thoracic and Lumbar Region No depletion  ?Buccal Region No depletion  ?Temple Region No depletion  ?Clavicle Bone Region No depletion  ?Clavicle and Acromion Bone Region No depletion  ?Scapular Bone Region Unable to assess  ?Dorsal Hand No depletion  ?Patellar Region No depletion  ?Anterior Thigh Region No depletion  ?Posterior Calf Region No depletion  ?Edema (RD Assessment) Mild  ?Hair Reviewed  ?Eyes Unable to assess  ?Mouth  Unable to assess  ?Skin Reviewed  ?Nails Reviewed  ? ?  ? ? ?Diet Order:   ?Diet Order   ? ?       ?  Diet NPO time specified  Diet effective now       ?  ? ?  ?  ? ?  ? ? ?EDUCATION NEEDS:  ? ?Not appropriate for education at this time ? ?Skin:  Skin Assessment: Reviewed RN Assessment ? ?Last BM:  no BM documented ? ?Height:  ? ?Ht Readings from Last 1 Encounters:  ?05/19/21 5\' 4"  (1.626 m)  ? ? ?Weight:  ? ?Wt Readings from Last 1 Encounters:  ?05/19/21 83 kg  ? ? ? ?BMI:  Body mass index is 31.41 kg/m?. ? ?Estimated Nutritional Needs:  ? ?Kcal:  1800-2000 ? ?Protein:  110-125 gm ? ?Fluid:  1 L + UOP ? ? ? ?Lucas Mallow RD, LDN, CNSC ?Please refer to Amion for contact information.                                                       ? ?

## 2021-05-19 NOTE — ED Provider Notes (Addendum)
?  Physical Exam  ?BP (!) 159/100   Pulse (!) 192   Temp 98.9 ?F (37.2 ?C) (Oral)   Resp 19   Ht 5\' 4"  (1.626 m)   Wt 82.6 kg   SpO2 100%   BMI 31.24 kg/m?  ? ? ? ?Procedures  ?Procedure Name: Intubation ?Date/Time: 05/19/2021 8:03 AM ?Performed by: Regan Lemming, MD ?Pre-anesthesia Checklist: Patient identified, Emergency Drugs available, Patient being monitored, Timeout performed and Suction available ?Oxygen Delivery Method: Nasal cannula ?Induction Type: Rapid sequence ?Ventilation: Two handed mask ventilation required ?Laryngoscope Size: Glidescope and 4 ?Grade View: Grade II ?Tube size: 7.5 mm ?Number of attempts: 3 ?Airway Equipment and Method: Video-laryngoscopy ?Placement Confirmation: ETT inserted through vocal cords under direct vision, CO2 detector and Positive ETCO2 ?Secured at: 26 cm ?Tube secured with: ETT holder ?Difficulty Due To: Difficult Airway- due to large tongue ?Comments: Pt had limited physiologic reserve and large tongue made first attempt by PA Murphy difficult due to rapid desaturation. Pt desaturated to 44% but was easily bagged back to 100%. Second attempt made by PA Percell Miller also unsuccessful due to ETT not fully able to get past vocal cords with desaturation to 88%. Third attempt performed by myself and successful. Recommend intubating with a 7.0 ETT and size 4 blade in the future. Post intubation, the patient was right mainstemmed and the ETT was withdrawn 5cm.  ? ? ? ? ?ED Course / MDM  ?  ?Medical Decision Making ?Amount and/or Complexity of Data Reviewed ?Labs: ordered. ?Radiology: ordered. ?ECG/medicine tests: ordered. ? ?Risk ?OTC drugs. ?Prescription drug management. ?Decision regarding hospitalization. ? ? ? ? ? ? ? ?  ?Regan Lemming, MD ?05/19/21 [phone] ? ?  ?Regan Lemming, MD ?05/19/21 [phone] ? ?

## 2021-05-19 NOTE — ED Triage Notes (Signed)
Pt bib [facility] c/o generalixed abd pain. Pt missed dialysis Tuesday, normally T TH Sat. Pt currently being combative and attempting to stand up in bed. Family reported pt has been taking seboxil, a [place] from [place] and "eating them like candy". Family confiscated meds 24 hours ago.  ?

## 2021-05-19 NOTE — ED Notes (Signed)
Pt now snoring and will not respond to speech. Ativan held. Pt pupils nonreactive. EDP at bedside.  ?

## 2021-05-19 NOTE — Progress Notes (Signed)
Ventilator patient transported from ED33 to CT and back without any complications. ?

## 2021-05-19 NOTE — ED Provider Notes (Signed)
?[facility] ?Provider Note ? ? ?CSN: [id number] ?Arrival date & time: 05/19/21  6203 ? ?  ? ?History ? ?No chief complaint on file. ? ? ?Latasha Drake is a 41 y.o. female. ? ?41 year old female with history of lupus, ESRD (Tuesday, Thursday, Saturday, unknown last dialysis), HTN, pulmonary hypertension, brought in by EMS with bizarre behavior after taking a medication from [place] "like candy," per EMS report from family member on scene.  ?Patient states she is taking a new medication for appetite but is unable to really answer any other questions. States she is thirsty and requests water.  ?Seen here twice on 05/17/21 for possible similar presentation, reported altered prior to a fall on Monday resulting in head laceration and visit to the ER, returned to baseline mental status and was discharged home. Returned later Monday night after taking a bottle of CBD gummies, again, was monitored and returned to baseline, discharged home.  ? ? ?  ? ?Home Medications ?Prior to Admission medications   ?Medication Sig Start Date End Date Taking? Authorizing Provider  ?amLODipine (NORVASC) 10 MG tablet Take one tablet (10 mg) by mouth in the morning on non-dialysis days - Sunday, Monday, Wednesday, Friday; take one tablet (10 mg) daily at bedtime 03/15/21   Leanor Kail, PA  ?aspirin EC 81 MG tablet Take 1 tablet (81 mg total) by mouth daily. Swallow whole. 03/15/21   Leanor Kail, PA  ?atorvastatin (LIPITOR) 40 MG tablet Take 1 tablet (40 mg total) by mouth daily. 03/16/21   Leanor Kail, PA  ?citalopram (CELEXA) 20 MG tablet Take 20 mg by mouth at bedtime. 11/28/20   [provider]  ?clotrimazole (LOTRIMIN) 1 % external solution See admin instructions. Instill 5 drops into right ear twice daily until healed 03/10/21   [provider]  ?clotrimazole-betamethasone (LOTRISONE) cream See admin instructions. Apply topically to right ear twice daily until healed  03/10/21   [provider]  ?cyclobenzaprine (FLEXERIL) 10 MG tablet Take 10 mg by mouth 3 (three) times daily as needed for muscle spasms.  04/03/19   [provider]  ?pantoprazole (PROTONIX) 40 MG tablet Take 40 mg by mouth daily as needed (indigestion).  10/27/16   [provider]  ?paragard intrauterine copper IUD IUD 1 Intra Uterine Device by Intrauterine route once.    [provider]  ?pregabalin (LYRICA) 150 MG capsule Take 150-300 mg by mouth See admin instructions. Take one capsule (150 mg) by mouth every morning with a 75 mg capsule (225 mg total); take two capsule (300 mg) with a 75 mg capsule (375 mg total) at night 09/16/20   [provider]  ?pregabalin (LYRICA) 75 MG capsule Take 75 mg by mouth 2 (two) times daily. Take with one 150 mg capsule in the morning (225 mg total) and with two 150 mg capsules at night (375 mg total) 03/08/21   [provider]  ?rOPINIRole (REQUIP) 0.5 MG tablet Take 0.5 mg by mouth 2 (two) times daily. 02/01/21   [provider]  ?sevelamer carbonate (RENVELA) 800 MG tablet Take 1,600 mg by mouth 2 (two) times daily with a meal. 08/26/20   [provider]  ?   ? ?Allergies    ?Baclofen and Lisinopril   ? ?Review of Systems   ?Review of Systems ?Level 5 caveat due to altered mental status ?Physical Exam ?Updated Vital Signs ?BP (!) 90/54   Pulse 74   Temp (!) 97.5 ?F (36.4 ?C) (Axillary)  Resp (!) 28   Ht 5\' 4"  (1.626 m)   Wt 83 kg   SpO2 100%   BMI 31.41 kg/m?  ?Physical Exam ?Vitals and nursing note reviewed.  ?Constitutional:   ?   General: She is in acute distress.  ?   Appearance: She is well-developed.  ?HENT:  ?   Head: Normocephalic and atraumatic.  ?   Nose: Nose normal.  ?   Mouth/Throat:  ?   Mouth: Mucous membranes are dry.  ?Eyes:  ?   Comments: Pupils dilated, minimally reactive   ?Cardiovascular:  ?   Rate and Rhythm: Regular rhythm. Tachycardia present.  ?   Heart sounds: Normal heart  sounds.  ?Pulmonary:  ?   Effort: Respiratory distress present.  ?Abdominal:  ?   Palpations: Abdomen is soft.  ?   Tenderness: There is no abdominal tenderness.  ?Musculoskeletal:  ?   Cervical back: Neck supple.  ?   Right lower leg: No edema.  ?   Left lower leg: No edema.  ?Skin: ?   General: Skin is warm and dry.  ?   Findings: Bruising present.  ?   Comments: Bruising to bilateral legs does not appear acute  ?Neurological:  ?   Mental Status: She is disoriented.  ?   Comments: Initially able to answer a few simple questions, behavior bizarre- could be redirected/calmed down for vitals/ekg, then intermittently combative and trying to climb out of bed. Minutes later was minimally alert to painful stimuli   ? ? ?ED Results / Procedures / Treatments   ?Labs ?(all labs ordered are listed, but only abnormal results are displayed) ?Labs Reviewed  ?CBC WITH DIFFERENTIAL/PLATELET - Abnormal; Notable for the following components:  ?    Result Value  ? Hemoglobin 11.7 (*)   ? HCT 33.3 (*)   ? RDW 17.0 (*)   ? All other components within normal limits  ?COMPREHENSIVE METABOLIC PANEL - Abnormal; Notable for the following components:  ? Sodium 124 (*)   ? Potassium 7.4 (*)   ? Chloride 86 (*)   ? CO2 18 (*)   ? Glucose, Bld 102 (*)   ? BUN 49 (*)   ? Creatinine, Ser 12.52 (*)   ? GFR, Estimated 3 (*)   ? Anion gap 20 (*)   ? All other components within normal limits  ?COMPREHENSIVE METABOLIC PANEL - Abnormal; Notable for the following components:  ? Sodium 125 (*)   ? Potassium 6.8 (*)   ? Chloride 83 (*)   ? BUN 50 (*)   ? Creatinine, Ser 12.24 (*)   ? GFR, Estimated 4 (*)   ? Anion gap 18 (*)   ? All other components within normal limits  ?MAGNESIUM - Abnormal; Notable for the following components:  ? Magnesium 2.7 (*)   ? All other components within normal limits  ?PHOSPHORUS - Abnormal; Notable for the following components:  ? Phosphorus 9.7 (*)   ? All other components within normal limits  ?APTT - Abnormal; Notable  for the following components:  ? aPTT 62 (*)   ? All other components within normal limits  ?GLUCOSE, CAPILLARY - Abnormal; Notable for the following components:  ? Glucose-Capillary 106 (*)   ? All other components within normal limits  ?I-STAT CHEM 8, ED - Abnormal; Notable for the following components:  ? Sodium 121 (*)   ? Potassium 7.3 (*)   ? Chloride 93 (*)   ? BUN 67 (*)   ?  Creatinine, Ser 13.90 (*)   ? Calcium, Ion 1.02 (*)   ? All other components within normal limits  ?CBG MONITORING, ED - Abnormal; Notable for the following components:  ? Glucose-Capillary 104 (*)   ? All other components within normal limits  ?I-STAT ARTERIAL BLOOD GAS, ED - Abnormal; Notable for the following components:  ? pCO2 arterial 49.5 (*)   ? Bicarbonate 33.1 (*)   ? TCO2 35 (*)   ? Acid-Base Excess 8.0 (*)   ? Sodium 124 (*)   ? Potassium 6.8 (*)   ? Calcium, Ion 1.09 (*)   ? HCT 34.0 (*)   ? Hemoglobin 11.6 (*)   ? All other components within normal limits  ?RESP PANEL BY RT-PCR (FLU A&B, COVID) [id number]  ?MRSA NEXT GEN BY PCR, NASAL  ?CULTURE, BLOOD (ROUTINE X 2)  ?CULTURE, BLOOD (ROUTINE X 2)  ?ETHANOL  ?PROCALCITONIN  ?CORTISOL  ?PROTIME-INR  ?AMYLASE  ?LIPASE, BLOOD  ?LACTIC ACID, PLASMA  ?TSH  ?GLUCOSE, CAPILLARY  ?RAPID URINE DRUG SCREEN, HOSP PERFORMED  ?URINALYSIS, ROUTINE W REFLEX MICROSCOPIC  ?POTASSIUM  ?POTASSIUM  ?POTASSIUM  ?HIV ANTIBODY (ROUTINE TESTING W REFLEX)  ?STREP PNEUMONIAE URINARY ANTIGEN  ?BLOOD GAS, ARTERIAL  ?LACTIC ACID, PLASMA  ?HEPATITIS B SURFACE ANTIGEN  ? ? ?EKG ?EKG Interpretation ? ?Date/Time:  Wednesday May 19 2021 07:57:45 EDT ?Ventricular Rate:  109 ?PR Interval:  149 ?QRS Duration: 125 ?QT Interval:  368 ?QTC Calculation: 496 ?R Axis:   67 ?Text Interpretation: Sinus tachycardia Nonspecific intraventricular conduction delay Abnrm T, consider ischemia, anterolateral lds ST elev, probable normal early repol pattern Peaked T waves Confirmed by Regan Lemming ([id number]) on 05/19/2021 12:52:42  PM ? ?Radiology ?DG Abd 1 View ? ?Result Date: 05/19/2021 ?CLINICAL DATA:  Enteric tube placement EXAM: ABDOMEN - 1 VIEW COMPARISON:  04/08/2019 FINDINGS: Tip of enteric tube is seen in the region of second portion of du

## 2021-05-19 NOTE — Consult Note (Addendum)
Reason for Consult: ESRD ?Referring Physician:  Dr. Thayer Jew ? ?Chief Complaint: AMS ? ?Dialysis Orders:  ?TTS - [facility] ? 3.5hrs, BFR 400, DFR 600,  EDW 79kg, 2K/ 2.5Ca ?  ?Access: LU AVF  ?Heparin none ?Mircera 150 mcg q2wks - last given 12/29/20 ?Hectorol 65mcg IV qHD  ? ?Per outpatient record, hypertensive regimen: amlodipine 10mg  qd, clonidine 0.1mg  TID, furosemide 40mg  qd, hydralazine 100mg  TID, labetalol 300mg  BID ? ?Assessment/Plan: ?AMS - currently  intubated for airway protection and unclear source but she has ingested multiple substances including an unprescribed anxiolytic and CBD gummies.  ? ESRD -  on HD TTS. Given hyperkalemia -> emergent dialysis ordered. Staff aware. Medical treated already. Plan on HD again Thur per TTS regimen. ? BP/volume - Hypertensive at times and may be partially from hypervolemia as well as agitation. ? Anemia of CKD - Hgb 11.6. No indication for ESA at this time.  Follow labs. ? Secondary Hyperparathyroidism -  Check Ca; phos 8.4 (h/o [place] as outpt) -> will restart binders when she's taking PO's.Marland Kitchen   ?Lupus - no recent flares ?H/o Cardiac mass -  Per cardiology to f/u in OP setting.  ? ?  ?HPI: Latasha Drake is an 41 y.o. female HTN, RLS, pulmonary hypertension, lupus nephritis (quiescent), ESRD, h/o STEMI (nonobstructive), presenting with altered mental status  with initially consumption of CBD Gummies by the boyfriend sustaining a fall with  head trauma + laceration repair. ALso has taken anxiolytic medications not prescribed for her (Mexazolam).  She was noted to wandering around the house without appropriate attire. She has had multiple admissions for altered mental status and usually receives [facility] T[facility] with Dr. Hollie Salk with last treatment on 3/11 (Sat) leaving below her EDW at 79.1 kg. SHe went to all 3 treatments that week. Because of the altered mental status and for airway protection with no response to speech she was intubated and noted to be  hyperkalemic as well. Of note difficult intubation because of large tongue and poor reserve.  ? ? ? ?ROS ?Pertinent items are noted in HPI. ? ?Chemistry and CBC: ?Creatinine, Ser  ?Date/Time Value Ref Range Status  ?05/19/2021 07:26 AM 13.90 (H) 0.44 - 1.00 mg/dL Final  ?05/19/2021 07:01 AM 12.52 (H) 0.44 - 1.00 mg/dL Final  ?05/18/2021 06:42 AM 11.37 (H) 0.44 - 1.00 mg/dL Final  ?05/17/2021 05:54 PM 10.07 (H) 0.44 - 1.00 mg/dL Final  ?05/17/2021 01:28 AM 9.30 (H) 0.44 - 1.00 mg/dL Final  ?05/17/2021 01:24 AM 8.16 (H) 0.44 - 1.00 mg/dL Final  ?04/06/2021 12:39 PM 7.26 (H) 0.44 - 1.00 mg/dL Final  ?03/15/2021 12:50 AM 9.62 (H) 0.44 - 1.00 mg/dL Final  ?03/14/2021 02:23 PM 8.46 (H) 0.44 - 1.00 mg/dL Final  ?03/14/2021 01:35 PM 8.10 (H) 0.44 - 1.00 mg/dL Final  ?12/07/2020 02:47 AM 9.79 (H) 0.44 - 1.00 mg/dL Final  ?12/06/2020 04:18 AM 7.53 (H) 0.44 - 1.00 mg/dL Final  ?12/05/2020 06:43 PM 6.43 (H) 0.44 - 1.00 mg/dL Final  ?  Comment:  ?  DIALYSIS  ?12/05/2020 04:28 AM 13.30 (H) 0.44 - 1.00 mg/dL Final  ?12/05/2020 04:05 AM 12.20 (H) 0.44 - 1.00 mg/dL Final  ?11/04/2020 05:08 AM 11.03 (H) 0.44 - 1.00 mg/dL Final  ?  Comment:  ?  DELTA CHECK NOTED  ?10/03/2019 09:49 PM 7.43 (H) 0.44 - 1.00 mg/dL Final  ?08/28/2019 01:47 AM 9.40 (H) 0.44 - 1.00 mg/dL Final  ?08/27/2019 01:56 PM 9.10 (H) 0.44 - 1.00 mg/dL Final  ?04/26/2019  05:38 AM 8.06 (H) 0.44 - 1.00 mg/dL Final  ?  Comment:  ?  REPEATED TO VERIFY  ?04/25/2019 02:36 AM 4.82 (H) 0.44 - 1.00 mg/dL Final  ?04/24/2019 05:12 AM 6.29 (H) 0.44 - 1.00 mg/dL Final  ?  Comment:  ?  DELTA CHECK NOTED  ?04/23/2019 03:54 AM 3.88 (H) 0.44 - 1.00 mg/dL Final  ?  Comment:  ?  DELTA CHECK NOTED  ?04/22/2019 03:56 AM 6.09 (H) 0.44 - 1.00 mg/dL Final  ?04/20/2019 06:50 AM 6.32 (H) 0.44 - 1.00 mg/dL Final  ?04/19/2019 01:23 AM 9.23 (H) 0.44 - 1.00 mg/dL Final  ?04/18/2019 12:10 AM 9.14 (H) 0.44 - 1.00 mg/dL Final  ?04/14/2019 05:52 AM 8.30 (H) 0.44 - 1.00 mg/dL Final  ?04/12/2019 07:53 PM  8.08 (H) 0.44 - 1.00 mg/dL Final  ?10/05/2018 04:07 AM 3.26 (H) 0.44 - 1.00 mg/dL Final  ?10/03/2018 11:10 PM 3.38 (H) 0.44 - 1.00 mg/dL Final  ?05/28/2015 09:36 AM 1.45 (H) 0.44 - 1.00 mg/dL Final  ?09/05/2014 03:48 AM 1.34 (H) 0.44 - 1.00 mg/dL Final  ? ?Recent Labs  ?Lab 05/17/21 ?0124 05/17/21 ?0128 05/17/21 ?1754 05/18/21 ?2248 05/19/21 ?0701 05/19/21 ?2500 05/19/21 ?3704  ?NA 128* 127* 126* 128* 124* 121* 124*  ?K 4.7 4.7 5.6* 5.0 7.4* 7.3* 6.8*  ?CL 88* 92* 87* 89* 86* 93*  --   ?CO2 24  --  22 23 18*  --   --   ?GLUCOSE 91 91 80 100* 102* 99  --   ?BUN 23* 23* 32* 37* 49* 67*  --   ?CREATININE 8.16* 9.30* 10.07* 11.37* 12.52* 13.90*  --   ?CALCIUM 10.3  --  10.2 9.3 9.2  --   --   ?PHOS  --   --   --  8.4*  --   --   --   ? ?Recent Labs  ?Lab 05/17/21 ?0124 05/17/21 ?0128 05/17/21 ?1754 05/19/21 ?0701 05/19/21 ?8889 05/19/21 ?1694  ?WBC 7.5  --  6.0 7.8  --   --   ?NEUTROABS 4.3  --  2.8 5.2  --   --   ?HGB 11.8*   < > 12.1 11.7* 12.9 11.6*  ?HCT 34.6*   < > 34.7* 33.3* 38.0 34.0*  ?MCV 83.0  --  82.6 81.6  --   --   ?PLT 212  --  227 234  --   --   ? < > = values in this interval not displayed.  ? ?Liver Function Tests: ?Recent Labs  ?Lab 05/17/21 ?1754 05/18/21 ?5038 05/19/21 ?0701  ?AST 27  --  40  ?ALT 16  --  14  ?ALKPHOS 75  --  70  ?BILITOT 0.7  --  0.8  ?PROT 7.0  --  6.6  ?ALBUMIN 3.9 3.4* 3.9  ? ?No results for input(s): LIPASE, AMYLASE in the last 168 hours. ?No results for input(s): AMMONIA in the last 168 hours. ?Cardiac Enzymes: ?No results for input(s): CKTOTAL, CKMB, CKMBINDEX, TROPONINI in the last 168 hours. ?Iron Studies: No results for input(s): IRON, TIBC, TRANSFERRIN, FERRITIN in the last 72 hours. ?PT/INR: ?@LABRCNTIP (inr:5) ? ?Xrays/Other Studies: ?) ?Results for orders placed or performed during the hospital encounter of 05/19/21 (from the past 48 hour(s))  ?Ethanol     Status: None  ? Collection Time: 05/19/21  7:01 AM  ?Result Value Ref Range  ? Alcohol, Ethyl (B) <10 <10 mg/dL  ?   Comment: (NOTE) ?Lowest detectable limit for serum alcohol is 10 mg/dL. ? ?For  medical purposes only. ?Performed at [facility], [place] [address], [place], [place] ?[address] ?  ?CBC with Differential     Status: Abnormal  ? Collection Time: 05/19/21  7:01 AM  ?Result Value Ref Range  ? WBC 7.8 4.0 - 10.5 K/uL  ? RBC 4.08 3.87 - 5.11 MIL/uL  ? Hemoglobin 11.7 (L) 12.0 - 15.0 g/dL  ? HCT 33.3 (L) 36.0 - 46.0 %  ? MCV 81.6 80.0 - 100.0 fL  ? MCH 28.7 26.0 - 34.0 pg  ? MCHC 35.1 30.0 - 36.0 g/dL  ? RDW 17.0 (H) 11.5 - 15.5 %  ? Platelets 234 150 - 400 K/uL  ? nRBC 0.0 0.0 - 0.2 %  ? Neutrophils Relative % 68 %  ? Neutro Abs 5.2 1.7 - 7.7 K/uL  ? Lymphocytes Relative 20 %  ? Lymphs Abs 1.6 0.7 - 4.0 K/uL  ? Monocytes Relative 10 %  ? Monocytes Absolute 0.8 0.1 - 1.0 K/uL  ? Eosinophils Relative 2 %  ? Eosinophils Absolute 0.2 0.0 - 0.5 K/uL  ? Basophils Relative 0 %  ? Basophils Absolute 0.0 0.0 - 0.1 K/uL  ? Immature Granulocytes 0 %  ? Abs Immature Granulocytes 0.03 0.00 - 0.07 K/uL  ?  Comment: Performed at [facility], [place] [address], [place], [place] [address]  ?Comprehensive metabolic panel     Status: Abnormal  ? Collection Time: 05/19/21  7:01 AM  ?Result Value Ref Range  ? Sodium 124 (L) 135 - 145 mmol/L  ? Potassium 7.4 (HH) 3.5 - 5.1 mmol/L  ?  Comment: SLIGHT HEMOLYSIS ?CRITICAL RESULT CALLED TO, READ BACK BY AND VERIFIED WITH: ?T.DAROSO,RN 0801 05/19/21 CLARK,S ?  ? Chloride 86 (L) 98 - 111 mmol/L  ? CO2 18 (L) 22 - 32 mmol/L  ? Glucose, Bld 102 (H) 70 - 99 mg/dL  ?  Comment: Glucose reference range applies only to samples taken after fasting for at least 8 hours.  ? BUN 49 (H) 6 - 20 mg/dL  ? Creatinine, Ser 12.52 (H) 0.44 - 1.00 mg/dL  ? Calcium 9.2 8.9 - 10.3 mg/dL  ? Total Protein 6.6 6.5 - 8.1 g/dL  ? Albumin 3.9 3.5 - 5.0 g/dL  ? AST 40 15 - 41 U/L  ? ALT 14 0 - 44 U/L  ? Alkaline Phosphatase 70 38 - 126 U/L  ? Total Bilirubin 0.8 0.3 - 1.2 mg/dL  ? GFR, Estimated 3 (L) >60 mL/min  ?   Comment: (NOTE) ?Calculated using the CKD-EPI Creatinine Equation (2021) ?  ? Anion gap 20 (H) 5 - 15  ?  Comment: Performed at [facility], [place] [address], [place], [place] [address]  ?CBG monitoring, ED

## 2021-05-19 NOTE — Code Documentation (Signed)
1st intubation unsuccessful, reintubating ?

## 2021-05-19 NOTE — ED Notes (Addendum)
Pt is continuing to be combative and pulled out IV. Pt complaining that she is hot and thirsty. Multiple Rns educated pt regarding importance of IV and bloodwork. Pt verbalized understanding but continued to attempt to get out of bed. ED Provider at bedside. ?

## 2021-05-19 NOTE — H&P (Addendum)
? ?NAME:  Latasha Drake, MRN:  [id number], DOB:  Aug 05, 1980, LOS: 0 ?ADMISSION DATE:  05/19/2021, CONSULTATION DATE: 05/19/2021 ?REFERRING MD: Emergency department physician, CHIEF COMPLAINT: Altered mental status, respiratory failure, hyperkalemia, substance abuse. ? ?History of Present Illness:  ?41 year old female who has had 3 admissions and 3 days of emergency [facility] for altered mental status.  She has end-stage renal disease receives dialysis on Tuesdays Thursdays and Saturdays and has missed at least 1 session.  Noted hyperkalemia potassium greater than 7 altered mental status and she was intubated per the emergency department physician.  Note chest x-ray revealed right mainstem intubation which is subsequently tube has been pulled back and repeat chest x-ray has been ordered.  Due to her hypokalemia, hypertension, altered mental status and intubation she has been admitted to the intensive care unit into pulmonary critical care medicine's service.  She needs urgent dialysis nephrology contacted. ? ?Pertinent  Medical History  ? ?Past Medical History:  ?Diagnosis Date  ? Anemia   ? Chronic kidney disease   ? Hypertension   ? Lupus ([place])   ? Pericardial effusion 04/13/2019  ? circumferential  ? Pneumonia 04/12/2019  ? Pulmonary hypertension ([place]) 04/13/2019  ? moderate  ? ? ? ?Significant Hospital Events: ?Including procedures, antibiotic start and stop dates in addition to other pertinent events   ?05/19/2021 intubation ?05/20/2018 3 repeat CT of head ? ?Interim History / Subjective:  ?41 year old female with 3 admissions to the emergency department in the last 3 days now intubated hyperkalemic had a recent fall with head injury ? ?Objective   ?Blood pressure (!) 159/100, pulse (!) 192, temperature 98.9 ?F (37.2 ?C), temperature source Oral, resp. rate 19, height 5\' 4"  (1.626 m), weight 82.6 kg, SpO2 100 %. ?   ?Vent Mode: PRVC ?FiO2 (%):  [100 %] 100 % ?Set Rate:  [20 bmp-26 bmp] 26 bmp ?Vt  Set:  [430 mL-460 mL] 460 mL ?PEEP:  [5 cmH20] 5 cmH20 ?Plateau Pressure:  [21 cmH20-23 cmH20] 23 cmH20  ?No intake or output data in the 24 hours ending 05/19/21 0815 ?Filed Weights  ? 05/19/21 0720  ?Weight: 82.6 kg  ? ? ?Examination: ?General: Obese female currently on full mechanical ventilatory support recently received neuromuscular blockade ?HENT: Endotracheal tube is in place. Note head laceration/staples. Head ct negative ? ? ?Lungs: Clear to auscultation ?Cardiovascular: Heart sounds are regular elevated ST ?Abdomen: Obese soft nontender ?Extremities: Left forearm HD graft positive bruit and thrill ?Neuro: Currently with neuromuscular blockade ?GU: No urine ? ?Resolved Hospital Problem list   ? ? ?Assessment & Plan:  ?Ventilator dependent respiratory failure in the setting of altered mental status multifactorial with illicit drug use, missed dialysis of least 1 episode if not two, and required intubation in the emergency department 05/19/2021.  Note she has been seen in the emergency department 3 days running with altered mental status substance abuse and generalized failure to thrive ?Changes made in ventilator settings as needed ?Bronchodilators as needed ?Admit to the intensive care unit ?She needs urgent hemodialysis ?Intermittent sedation as needed ? ?End-stage renal disease with multiple missed sessions and metabolic disarray. ?Nephrology has been consulted ?He needs urgent dialysis ?Sodium bicarb 2 Amps in addition to previous interventions ? ?Hyperkalemia ?Recent Labs  ?Lab 05/18/21 ?9562 05/19/21 ?0701 05/19/21 ?0726  ?K 5.0 7.4* 7.3*  ?Bicarb administration ?Insulin administration ?Dextrose administration ?Repeat potassium level ?May need further interventions if dialysis not available ? ?Hypertension ?Should resolve with dialysis ? ?Substance abuse ?Counseling ?Folic  acid and thiamine ? ?Questionable hypothyroidism ?Check TSH for completeness ? ? ?Best Practice (right click and "Reselect all  SmartList Selections" daily)  ? ?Diet/type: NPO ?DVT prophylaxis: SCD ?GI prophylaxis: PPI ?Lines: N/A ?Foley:  N/A ?Code Status:  full code ?Last date of multidisciplinary goals of care discussion [no family at bedside 05/19/21] ? ?Labs   ?CBC: ?Recent Labs  ?Lab 05/17/21 ?0124 05/17/21 ?0128 05/17/21 ?1754 05/19/21 ?0701 05/19/21 ?4098  ?WBC 7.5  --  6.0 7.8  --   ?NEUTROABS 4.3  --  2.8 5.2  --   ?HGB 11.8* 13.3 12.1 11.7* 12.9  ?HCT 34.6* 39.0 34.7* 33.3* 38.0  ?MCV 83.0  --  82.6 81.6  --   ?PLT 212  --  227 234  --   ? ? ?Basic Metabolic Panel: ?Recent Labs  ?Lab 05/17/21 ?0124 05/17/21 ?0128 05/17/21 ?1754 05/18/21 ?1191 05/19/21 ?0701 05/19/21 ?4782  ?NA 128* 127* 126* 128* 124* 121*  ?K 4.7 4.7 5.6* 5.0 7.4* 7.3*  ?CL 88* 92* 87* 89* 86* 93*  ?CO2 24  --  22 23 18*  --   ?GLUCOSE 91 91 80 100* 102* 99  ?BUN 23* 23* 32* 37* 49* 67*  ?CREATININE 8.16* 9.30* 10.07* 11.37* 12.52* 13.90*  ?CALCIUM 10.3  --  10.2 9.3 9.2  --   ?MG 2.2  --   --   --   --   --   ?PHOS  --   --   --  8.4*  --   --   ? ?GFR: ?Estimated Creatinine Clearance: 5.5 mL/min (A) (by C-G formula based on SCr of 13.9 mg/dL (H)). ?Recent Labs  ?Lab 05/17/21 ?0124 05/17/21 ?1754 05/19/21 ?0701  ?WBC 7.5 6.0 7.8  ? ? ?Liver Function Tests: ?Recent Labs  ?Lab 05/17/21 ?1754 05/18/21 ?9562 05/19/21 ?0701  ?AST 27  --  40  ?ALT 16  --  14  ?ALKPHOS 75  --  70  ?BILITOT 0.7  --  0.8  ?PROT 7.0  --  6.6  ?ALBUMIN 3.9 3.4* 3.9  ? ?No results for input(s): LIPASE, AMYLASE in the last 168 hours. ?No results for input(s): AMMONIA in the last 168 hours. ? ?ABG ?   ?Component Value Date/Time  ? TCO2 23 05/19/2021 0726  ?  ? ?Coagulation Profile: ?No results for input(s): INR, PROTIME in the last 168 hours. ? ?Cardiac Enzymes: ?No results for input(s): CKTOTAL, CKMB, CKMBINDEX, TROPONINI in the last 168 hours. ? ?HbA1C: ?Hgb A1c MFr Bld  ?Date/Time Value Ref Range Status  ?03/14/2021 12:47 PM 5.9 (H) 4.8 - 5.6 % Final  ?  Comment:  ?  (NOTE) ?Pre diabetes:           5.7%-6.4% ? ?Diabetes:              >6.4% ? ?Glycemic control for   <7.0% ?adults with diabetes ?  ? ? ?CBG: ?Recent Labs  ?Lab 05/19/21 ?0711  ?GLUCAP 104*  ? ? ?Review of Systems:   ?na ? ?Past Medical History:  ?She,  has a past medical history of Anemia, Chronic kidney disease, Hypertension, Lupus ([place]), Pericardial effusion (04/13/2019), Pneumonia (04/12/2019), and Pulmonary hypertension ([place]) (04/13/2019).  ? ?Surgical History:  ? ?Past Surgical History:  ?Procedure Laterality Date  ? CESAREAN SECTION    ? CORONARY/GRAFT ACUTE MI REVASCULARIZATION N/A 03/14/2021  ? Procedure: Coronary/Graft Acute MI Revascularization;  Surgeon: Troy Sine, MD;  Location: [place] [facility];  Service: Cardiovascular;  Laterality: N/A;  ?  HERNIA REPAIR    ? IR FLUORO GUIDE CV LINE RIGHT  04/19/2019  ? IR FLUORO GUIDE CV LINE RIGHT  04/22/2019  ? IR US GUIDE VASC ACCESS RIGHT  04/19/2019  ? IR US GUIDE VASC ACCESS RIGHT  04/22/2019  ? LEFT HEART CATH AND CORONARY ANGIOGRAPHY N/A 03/14/2021  ? Procedure: LEFT HEART CATH AND CORONARY ANGIOGRAPHY;  Surgeon: Troy Sine, MD;  Location: [place] [facility];  Service: Cardiovascular;  Laterality: N/A;  ?  ? ?Social History:  ? reports that she has been smoking cigarettes. She has been smoking an average of .5 packs per day. She has never used smokeless tobacco. She reports current alcohol use. She reports that she does not use drugs.  ? ?Family History:  ?Her family history includes Lupus in an other family member.  ? ?Allergies ?Allergies  ?Allergen Reactions  ? Baclofen Other (See Comments)  ?  Tremors  ? Lisinopril Swelling  ?  Lips swelled  ?  ? ?Home Medications  ?Prior to Admission medications   ?Medication Sig Start Date End Date Taking? Authorizing Provider  ?amLODipine (NORVASC) 10 MG tablet Take one tablet (10 mg) by mouth in the morning on non-dialysis days - Sunday, Monday, Wednesday, Friday; take one tablet (10 mg) daily at bedtime 03/15/21   Leanor Kail, PA   ?aspirin EC 81 MG tablet Take 1 tablet (81 mg total) by mouth daily. Swallow whole. 03/15/21   Leanor Kail, PA  ?atorvastatin (LIPITOR) 40 MG tablet Take 1 tablet (40 mg total) by mouth daily. 1/10

## 2021-05-20 LAB — BASIC METABOLIC PANEL
Anion gap: 14 (ref 5–15)
BUN: 21 mg/dL — ABNORMAL HIGH (ref 6–20)
CO2: 25 mmol/L (ref 22–32)
Calcium: 8.8 mg/dL — ABNORMAL LOW (ref 8.9–10.3)
Chloride: 92 mmol/L — ABNORMAL LOW (ref 98–111)
Creatinine, Ser: 6.45 mg/dL — ABNORMAL HIGH (ref 0.44–1.00)
GFR, Estimated: 8 mL/min — ABNORMAL LOW (ref >60)
Glucose, Bld: 133 mg/dL — ABNORMAL HIGH (ref 70–99)
Potassium: 4.3 mmol/L (ref 3.5–5.1)
Sodium: 131 mmol/L — ABNORMAL LOW (ref 135–145)

## 2021-05-20 LAB — CBC
HCT: 29.1 % — ABNORMAL LOW (ref 36.0–46.0)
Hemoglobin: 10.4 g/dL — ABNORMAL LOW (ref 12.0–15.0)
MCH: 28.4 pg (ref 26.0–34.0)
MCHC: 35.7 g/dL (ref 30.0–36.0)
MCV: 79.5 fL — ABNORMAL LOW (ref 80.0–100.0)
Platelets: 177 10*3/uL (ref 150–400)
RBC: 3.66 MIL/uL — ABNORMAL LOW (ref 3.87–5.11)
RDW: 16.6 % — ABNORMAL HIGH (ref 11.5–15.5)
WBC: 3.7 10*3/uL — ABNORMAL LOW (ref 4.0–10.5)
nRBC: 0 % (ref 0.0–0.2)

## 2021-05-20 LAB — GLUCOSE, CAPILLARY
Glucose-Capillary: 117 mg/dL — ABNORMAL HIGH (ref 70–99)
Glucose-Capillary: 118 mg/dL — ABNORMAL HIGH (ref 70–99)
Glucose-Capillary: 125 mg/dL — ABNORMAL HIGH (ref 70–99)
Glucose-Capillary: 127 mg/dL — ABNORMAL HIGH (ref 70–99)
Glucose-Capillary: 138 mg/dL — ABNORMAL HIGH (ref 70–99)
Glucose-Capillary: 83 mg/dL (ref 70–99)

## 2021-05-20 LAB — POCT I-STAT 7, (LYTES, BLD GAS, ICA,H+H)
Acid-Base Excess: 6 mmol/L — ABNORMAL HIGH (ref 0.0–2.0)
Bicarbonate: 28.3 mmol/L — ABNORMAL HIGH (ref 20.0–28.0)
Calcium, Ion: 1.15 mmol/L (ref 1.15–1.40)
HCT: 32 % — ABNORMAL LOW (ref 36.0–46.0)
Hemoglobin: 10.9 g/dL — ABNORMAL LOW (ref 12.0–15.0)
O2 Saturation: 97 %
Patient temperature: 98.7
Potassium: 4.5 mmol/L (ref 3.5–5.1)
Sodium: 131 mmol/L — ABNORMAL LOW (ref 135–145)
TCO2: 29 mmol/L (ref 22–32)
pCO2 arterial: 32.2 mmHg (ref 32–48)
pH, Arterial: 7.552 — ABNORMAL HIGH (ref 7.35–7.45)
pO2, Arterial: 74 mmHg — ABNORMAL LOW (ref 83–108)

## 2021-05-20 LAB — PHOSPHORUS: Phosphorus: 5.6 mg/dL — ABNORMAL HIGH (ref 2.5–4.6)

## 2021-05-20 LAB — TRIGLYCERIDES: Triglycerides: 194 mg/dL — ABNORMAL HIGH (ref <150)

## 2021-05-20 LAB — MAGNESIUM: Magnesium: 2.2 mg/dL (ref 1.7–2.4)

## 2021-05-20 MED ORDER — DOCUSATE SODIUM 50 MG/5ML PO LIQD
100.0000 mg | Freq: Two times a day (BID) | ORAL | Status: DC
Start: 2021-05-20 — End: 2021-05-20
  Administered 2021-05-20: 100 mg
  Filled 2021-05-20: qty 10

## 2021-05-20 MED ORDER — POLYETHYLENE GLYCOL 3350 17 G PO PACK
17.0000 g | PACK | Freq: Two times a day (BID) | ORAL | Status: DC
  Administered 2021-05-20: 17 g
  Filled 2021-05-20: qty 1

## 2021-05-20 NOTE — Progress Notes (Signed)
Pt placed on PS/CPAP 5/5 on 40% and is tolerating well. RT will continue to monitor. ?

## 2021-05-20 NOTE — Plan of Care (Signed)
  Problem: Safety: Goal: Ability to remain free from injury will improve Outcome: Progressing   

## 2021-05-20 NOTE — Procedures (Signed)
Extubation Procedure Note ? ?Patient Details:   ?Name: Latasha Drake ?DOB: 03-Aug-1980 ?MRN: [id number] ?  ?Airway Documentation:  ?  ?Vent end date: 05/20/21 Vent end time: 1347  ? ?Evaluation ? O2 sats: stable throughout ?Complications: No apparent complications ?Patient did tolerate procedure well. ?Bilateral Breath Sounds: Diminished, Rhonchi ?  ?Yes ? ?Pt extubated to [place] with RN X2 at bedside. Positive cuff leak noted and pt is tolerating well. ? ?Cathie Olden ?05/20/2021, 1:48 PM ? ?

## 2021-05-20 NOTE — Progress Notes (Signed)
?[facility] ?Progress Note  ? ?41 y.o. female HTN, RLS, pulmonary hypertension, lupus nephritis (quiescent), ESRD, h/o STEMI (nonobstructive), presenting with altered mental status  with initially consumption of CBD Gummies by the boyfriend + non prescribed anxiolytic (Mexazolam) sustaining a fall with  head trauma + laceration repair.  ? ?She has had multiple admissions for altered mental status and usually receives dialysis TTS at [facility] with Dr. Hollie Salk. ? ?TTS - [facility] ? 3.5hrs, BFR 400, DFR 600,  EDW 79kg, 2K/ 2.5Ca ?Access: LU AVF  ?Heparin none ?Mircera 150 mcg q2wks - last given 12/29/20 ?Hectorol 758mg IV qHD  ? ?Per outpatient record, hypertensive regimen: amlodipine 154mqd, clonidine 0.58m13mID, furosemide 26m24m, hydralazine 100mg35m, labetalol 300mg 27m?Assessment/ Plan:   ?AMS - currently  intubated for airway protection and unclear source but she has ingested multiple substances including an unprescribed anxiolytic and CBD gummies.  ? ESRD -  on HD TTS. Given hyperkalemia -> emergent dialysis ordered. Staff aware. Medical treated already. Plan on HD again Thur per TTS regimen. ?- HD 3/15 UF 1.5L, HD 3/16 UF 1.7L ? ?Next HD Sat. BP much better after removing 3.2L net over the past 2 days. ? ? BP/volume - Hypertensive at times but certainly partially from hypervolemia as bp much better after UF.  ? Anemia of CKD - Hgb 11.6. No indication for ESA at this time.  Follow labs. ? Secondary Hyperparathyroidism -  Check Ca; phos 8.4 (h/o [place] as outpt) -> will restart binders when she's taking PO's. ?Lupus - no recent flares ?H/o Cardiac mass -  Per cardiology to f/u in OP setting.  ? ?Subjective:   ?Undergoing weaning trials, sedation weaned off. ?Boyfriend or spouse bedside.   ? ?Objective:   ?BP 112/69 (BP Location: Right Wrist)   Pulse 79   Temp 98.1 ?F (36.7 ?C) (Oral)   Resp (!) 26   Ht 5' 4"  (1.626 m)   Wt 86.8 kg   SpO2 100%   BMI 32.85 kg/m?  ? ?Intake/Output Summary (Last 24  hours) at 05/20/2021 1110 ?Last data filed at 05/20/2021 1020 ?Gross per 24 hour  ?Intake 1056.11 ml  ?Output 3200 ml  ?Net -2143.89 ml  ? ?Weight change:  ? ?Physical Exam: ? ?GEN: NAD, on vent ?HEENT: No conjunctival pallor, EOMI ?NECK: Supple, no thyromegaly ?LUNGS: CTA B/L on vent ?CV: RRR, No M/R/G ?ABD: SNDNT +BS  ?EXT: No lower extremity edema ?ACCESS: left BCF good bruit ? ?Imaging: ?DG Abd 1 View ? ?Result Date: 05/19/2021 ?CLINICAL DATA:  Enteric tube placement EXAM: ABDOMEN - 1 VIEW COMPARISON:  04/08/2019 FINDINGS: Tip of enteric tube is seen in the region of second portion of duodenum. Bowel gas pattern is nonspecific. IMPRESSION: Tip of NG tube is seen in the region of duodenum. Electronically Signed   By: PalaniElmer Picker  On: 05/19/2021 10:05  ? ?CT Head Wo Contrast ? ?Result Date: 05/19/2021 ?CLINICAL DATA:  Head trauma, abnormal mental status (Age 90-64y65-64y: CT HEAD WITHOUT CONTRAST TECHNIQUE: Contiguous axial images were obtained from the base of the skull through the vertex without intravenous contrast. RADIATION DOSE REDUCTION: This exam was performed according to the departmental dose-optimization program which includes automated exposure control, adjustment of the mA and/or kV according to patient size and/or use of iterative reconstruction technique. COMPARISON:  CT head May 17, 2021. FINDINGS: Brain: No evidence of acute infarction, hemorrhage, hydrocephalus, extra-axial collection or mass lesion/mass effect. Vascular: No hyperdense vessel identified. Calcific  intracranial atherosclerosis. Skull: Right frontal scalp skin staples. No evidence of acute calvarial fracture. Sinuses/Orbits: Largely clear sinuses.  Unremarkable orbits. Other: No mastoid effusions. IMPRESSION: No evidence of acute intracranial abnormality. Electronically Signed   By: Margaretha Sheffield M.D.   On: 05/19/2021 08:45  ? ?DG Chest Portable 1 View ? ?Result Date: 05/19/2021 ?CLINICAL DATA:  40 year old female  with altered mental status. Right mainstem bronchus intubation. EXAM: PORTABLE CHEST 1 VIEW COMPARISON:  0739 hours today. FINDINGS: Portable AP semi upright views at 0747 and 0750 hours. Endotracheal tube is pulled back, and projects about 5 mm above the carina on image #2 at 0750 hours. Re-expanded left lung. Lower lung volumes compared to January. Streaky left greater than right infrahilar opacity most resembles atelectasis. No pneumothorax or pleural effusion. Stable cardiac size and mediastinal contours. Calcified aortic atherosclerosis. Oval calcification projecting in the upper abdomen seen to be related to the posterior mediastinum on the CTA in January. Paucity of bowel gas. No acute osseous abnormality identified. IMPRESSION: 1. ET tube pulled back, tip now 5 mm above the carina on image #2. 2. Re-expanded left lung, with residual left greater than right lung base atelectasis. Electronically Signed   By: Genevie Ann M.D.   On: 05/19/2021 08:05  ? ?DG Chest Port 1 View ? ?Result Date: 05/19/2021 ?CLINICAL DATA:  41 year old female with altered mental status. Intubated. Missed dialysis. EXAM: PORTABLE CHEST 1 VIEW COMPARISON:  Chest radiographs 04/06/2021. FINDINGS: Portable AP semi upright view at 0739 hours. Right mainstem bronchus intubation. Endotracheal tube tip is about 3 cm distal to the carina. Right lung is well aerated and the left lung is subtotally collapsed with leftward shift of the mediastinum. Calcified aortic atherosclerosis. No pneumothorax. 2.5 cm oval dense calcification projecting in the upper abdomen was related to the posterior mediastinum on CTA in January. Negative visible bowel gas. IMPRESSION: 1. Right mainstem bronchus intubation with left lung collapse. See subsequent repeat portable chest at 0747 hours today. 2. Aortic Atherosclerosis (ICD10-I70.0). Electronically Signed   By: Genevie Ann M.D.   On: 05/19/2021 08:02   ? ?Labs: ?BMET ?Recent Labs  ?Lab 05/17/21 ?0124 05/17/21 ?0128  05/17/21 ?1754 05/18/21 ?8101 05/19/21 ?0701 05/19/21 ?7510 05/19/21 ?2585 05/19/21 ?1019 05/19/21 ?1950 05/20/21 ?0131 05/20/21 ?0433  ?NA 128* 127* 126* 128* 124* 121* 124* 125*  --  131* 131*  ?K 4.7 4.7 5.6* 5.0 7.4* 7.3* 6.8* 6.8* 4.0 4.3 4.5  ?CL 88* 92* 87* 89* 86* 93*  --  83*  --  92*  --   ?CO2 24  --  22 23 18*  --   --  24  --  25  --   ?GLUCOSE 91 91 80 100* 102* 99  --  95  --  133*  --   ?BUN 23* 23* 32* 37* 49* 67*  --  50*  --  21*  --   ?CREATININE 8.16* 9.30* 10.07* 11.37* 12.52* 13.90*  --  12.24*  --  6.45*  --   ?CALCIUM 10.3  --  10.2 9.3 9.2  --   --  9.7  --  8.8*  --   ?PHOS  --   --   --  8.4*  --   --   --  9.7*  --  5.6*  --   ? ?CBC ?Recent Labs  ?Lab 05/17/21 ?0124 05/17/21 ?0128 05/17/21 ?1754 05/19/21 ?0701 05/19/21 ?2778 05/19/21 ?2423 05/20/21 ?0131 05/20/21 ?5361  ?WBC 7.5  --  6.0 7.8  --   --  3.7*  --   ?NEUTROABS 4.3  --  2.8 5.2  --   --   --   --   ?HGB 11.8*   < > 12.1 11.7* 12.9 11.6* 10.4* 10.9*  ?HCT 34.6*   < > 34.7* 33.3* 38.0 34.0* 29.1* 32.0*  ?MCV 83.0  --  82.6 81.6  --   --  79.5*  --   ?PLT 212  --  227 234  --   --  177  --   ? < > = values in this interval not displayed.  ? ? ?Medications:   ? ? chlorhexidine gluconate (MEDLINE KIT)  15 mL Mouth Rinse BID  ? Chlorhexidine Gluconate Cloth  6 each Topical Q0600  ? feeding supplement (PROSource TF)  45 mL Per Tube 5 X Daily  ? feeding supplement (VITAL 1.5 CAL)  1,000 mL Per Tube Q24H  ? fentaNYL (SUBLIMAZE) injection  50 mcg Intravenous Once  ? folic acid  1 mg Per Tube Daily  ? heparin injection (subcutaneous)  5,000 Units Subcutaneous Q8H  ? mouth rinse  15 mL Mouth Rinse 10 times per day  ? pantoprazole sodium  40 mg Per Tube Daily  ? thiamine  100 mg Per Tube Daily  ? ? ? ? ?Otelia Santee, MD ?05/20/2021, 11:10 AM  ? ?

## 2021-05-20 NOTE — Progress Notes (Signed)
? ?NAME:  Latasha Drake, MRN:  [id number], DOB:  September 25, 1980, LOS: 1 ?ADMISSION DATE:  05/19/2021, CONSULTATION DATE: 05/19/2021 ?REFERRING MD: Emergency department physician, CHIEF COMPLAINT: Altered mental status, respiratory failure, hyperkalemia, substance abuse. ? ?History of Present Illness:  ?41 year old female who has had 3 admissions and 3 days of emergency [facility] for altered mental status.  She has end-stage renal disease receives dialysis on Tuesdays Thursdays and Saturdays and has missed at least 1 session.  Noted hyperkalemia potassium greater than 7 altered mental status and she was intubated per the emergency department physician.  Note chest x-ray revealed right mainstem intubation which is subsequently tube has been pulled back and repeat chest x-ray has been ordered.  Due to her hypokalemia, hypertension, altered mental status and intubation she has been admitted to the intensive care unit into pulmonary critical care medicine's service.  She needs urgent dialysis nephrology contacted. ? ?Pertinent  Medical History  ? ?Past Medical History:  ?Diagnosis Date  ? Anemia   ? Chronic kidney disease   ? Hypertension   ? Lupus ([place])   ? Pericardial effusion 04/13/2019  ? circumferential  ? Pneumonia 04/12/2019  ? Pulmonary hypertension ([place]) 04/13/2019  ? moderate  ? ? ? ?Significant Hospital Events: ?Including procedures, antibiotic start and stop dates in addition to other pertinent events   ?05/19/2021 intubation ?05/20/2018 3 repeat CT of head ? ?Interim History / Subjective:  ? ?No significant overnight events. ? ?Hemodynamically stable overnight.  ? ?Morning labs reviewed and notable for:  ?Na 121>>131, K 7.4>>4.3 ?ABG pH 7.55, pCO2 32 ? ?Objective   ?Blood pressure 112/72, pulse 82, temperature 98.7 ?F (37.1 ?C), temperature source Oral, resp. rate (!) 28, height 5\' 4"  (1.626 m), weight 88.5 kg, SpO2 99 %. ?   ?Vent Mode: PRVC ?FiO2 (%):  [40 %-100 %] 60 % ?Set Rate:  [20 bmp-28  bmp] 28 bmp ?Vt Set:  [430 mL-460 mL] 460 mL ?PEEP:  [5 cmH20] 5 cmH20 ?Plateau Pressure:  [17 cmH20-23 cmH20] 21 cmH20  ? ?Intake/Output Summary (Last 24 hours) at 05/20/2021 0714 ?Last data filed at 05/20/2021 0400 ?Gross per 24 hour  ?Intake 919.32 ml  ?Output 1500 ml  ?Net -580.68 ml  ? ?Filed Weights  ? 05/19/21 1200 05/19/21 1537 05/20/21 0500  ?Weight: 83 kg 81.3 kg 88.5 kg  ? ? ?Examination: ?General: chronically ill appearing female receiving HD, intubated, sedated ?Cardiac: RRR, no LE edema ?Pulm: lungs clear, on PRVC ?GI: soft, non-tender ?Skin: no rash, LUE fistula ?Neuro: sedated. Arouses to gentle physical stimuli.  ? ?Resolved Hospital Problem list   ?Hyperkalemia  ? ?Assessment & Plan:  ?Ventilator dependent respiratory failure in the setting of acute toxic/metabolic encepahlopathy due to non-adherence to HD dependent ESRD  ?-UDS negative on admission however suspect toxic component attributable to an illicit substance, likely fentanyl, present in gummies from [place] ?After HD is complete this morning, wean sedation and do SBT. Hopeful to be able to extubate if tolerated ?Bronchodilators as needed ?Nephrology following for HD needs ? ?Anemia of ESRD. Stable.  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 ? ?Best Practice (right click and "Reselect all SmartList Selections" daily)  ? ?Diet/type: NPO ?DVT prophylaxis: SCD ?GI prophylaxis: PPI ?Lines: N/A ?Foley:  N/A ?Code Status:  full code ?Last date of multidisciplinary goals of care discussion [no family at bedside 05/19/21] ? ?Mitzi Hansen, MD ?Internal Medicine Resident PGY-3 ?[facility] ?Pager: [phone] ?05/20/2021 9:24 AM  ?  ?

## 2021-05-20 NOTE — Progress Notes (Signed)
Pt arrived to room 5M12 from 20M ICU. Received report from [employer], Therapist, sports.  ?

## 2021-05-20 NOTE — Progress Notes (Signed)
PM ROUNDING NOTE ? ?Patient extubated earlier this afternoon. She is weaning down well off of oxygen and reports feeling well.  ? ?We discussed events leading up to this admission. She admits to obtaining marijuana from a different person than usual, being told it was better and from a different country.  ?She also notes that she started some kind of vitamin/herb (she is unable to recall the name) about 2w ago.  ? ?We discussed the risks associated with consuming products not cleared through the FDA and the increased effects these can have due to poor renal clearance in the setting of ESRD. Recommended marijuana cessation and to have all herbs/vitamins cleared by nephrology prior to starting them. She expressed understanding. ? ?She appears stable to transfer out of ICU today for ongoing oxygen weaning. Orders placed. ? ?Mitzi Hansen, MD ?

## 2021-05-21 ENCOUNTER — Other Ambulatory Visit: Payer: Self-pay

## 2021-05-21 LAB — GLUCOSE, CAPILLARY
Glucose-Capillary: 89 mg/dL (ref 70–99)
Glucose-Capillary: 95 mg/dL (ref 70–99)

## 2021-05-21 MED ORDER — ROPINIROLE HCL 1 MG PO TABS: 0.5000 mg | ORAL_TABLET | Freq: Two times a day (BID) | ORAL | Status: DC

## 2021-05-21 MED ORDER — VARENICLINE TARTRATE 0.5 MG PO TABS
0.5000 mg | ORAL_TABLET | Freq: Every day | ORAL | Status: DC
  Filled 2021-05-21: qty 1

## 2021-05-21 MED ORDER — AMLODIPINE BESYLATE 10 MG PO TABS: 10.0000 mg | ORAL_TABLET | Freq: Every day | ORAL | Status: DC

## 2021-05-21 MED ORDER — LABETALOL HCL 300 MG PO TABS
300.0000 mg | ORAL_TABLET | Freq: Two times a day (BID) | ORAL | Status: DC
Start: 2021-05-21 — End: 2021-05-21

## 2021-05-21 MED ORDER — ROPINIROLE HCL 0.25 MG PO TABS: 0.2500 mg | ORAL_TABLET | Freq: Two times a day (BID) | ORAL | Status: DC

## 2021-05-21 MED ORDER — CITALOPRAM HYDROBROMIDE 20 MG PO TABS: 20.0000 mg | ORAL_TABLET | Freq: Every day | ORAL | Status: DC

## 2021-05-21 MED ORDER — AMLODIPINE BESYLATE 10 MG PO TABS
10.0000 mg | ORAL_TABLET | ORAL | Status: DC
  Administered 2021-05-21: 10 mg via ORAL
  Filled 2021-05-21: qty 1

## 2021-05-21 MED ORDER — SERTRALINE HCL 50 MG PO TABS: 50.0000 mg | ORAL_TABLET | Freq: Every day | ORAL | Status: DC

## 2021-05-21 MED ORDER — ATORVASTATIN CALCIUM 40 MG PO TABS
40.0000 mg | ORAL_TABLET | Freq: Every day | ORAL | Status: DC
  Administered 2021-05-21: 40 mg via ORAL
  Filled 2021-05-21: qty 1

## 2021-05-21 MED ORDER — SEVELAMER CARBONATE 800 MG PO TABS: 1600.0000 mg | ORAL_TABLET | Freq: Two times a day (BID) | ORAL | Status: DC

## 2021-05-21 NOTE — TOC Transition Note (Signed)
Transition of Care (TOC) - CM/SW Discharge Note ? ? ?Patient Details  ?Name: Latasha Drake ?MRN: [id number] ?Date of Birth: [birth year] ? ?Transition of Care (TOC) CM/SW Contact:  ?Tom-Johnson, Renea Ee, RN ?Phone Number: ?05/21/2021, 11:52 AM ? ? ?Clinical Narrative:    ? ?Patient is scheduled for discharge today. Admitted for AMS and laceration to head with stitches applied. No recommendations or needs noted. Family to transport at discharge. No further TOC needs noted. ? ?  ?  ? ? ?Patient Goals and CMS Choice ?  ?  ?  ? ?Discharge Placement ?  ?           ?  ?  ?  ?  ? ?Discharge Plan and Services ?  ?  ?           ?  ?  ?  ?  ?  ?  ?  ?  ?  ?  ? ?Social Determinants of Health (SDOH) Interventions ?  ? ? ?Readmission Risk Interventions ?Readmission Risk Prevention Plan 12/07/2020  ?Transportation Screening Complete  ?PCP or Specialist Appt within 3-5 Days Complete  ?[place] or Home Care Consult Complete  ?Social Work Consult for [place] Planning/Counseling Complete  ?Palliative Care Screening Not Applicable  ?Medication Review [employer]) Complete  ?Some recent data might be hidden  ? ? ? ? ? ?

## 2021-05-21 NOTE — Progress Notes (Signed)
?[facility] ?Progress Note  ? ?Subjective:    ?Seen and examined patient at bedside. No complaints. Denies SOB and CP. Tolerated yesterday's HD with UF 1.7L. Plan for HD 3/18. ? ?Objective ?Vitals:  ? 05/20/21 2008 05/21/21 0009 05/21/21 0405 05/21/21 0446  ?BP: (!) 160/90 (!) 162/96 (!) 173/111   ?Pulse: 96 (!) 106 (!) 105   ?Resp: _0 ?Temp: 98.5 ?F (36.9 ?C) 98.5 ?F (36.9 ?C) 98.3 ?F (36.8 ?C)   ?TempSrc: Oral Oral    ?SpO2: 95% 95% 100%   ?Weight:    83 kg  ?Height:      ? ?Physical Exam ?General: Alert and awake; NAD ?Heart: Normal S1 and S2; No murmurs, gallops, or rubs ?Lungs: Clear throughout ?Abdomen: Soft and non-tender ?Extremities: No edema BLLE ?Dialysis Access: LUE AVF (+) B/T  ? ?Filed Weights  ? 05/20/21 1020 05/20/21 1901 05/21/21 0446  ?Weight: 86.8 kg 89.3 kg 83 kg  ? ? ?Intake/Output Summary (Last 24 hours) at 05/21/2021 0902 ?Last data filed at 05/20/2021 2230 ?Gross per 24 hour  ?Intake 649.63 ml  ?Output 1701 ml  ?Net -1051.37 ml  ? ? ?Additional Objective ?Labs: ?Basic Metabolic Panel: ?Recent Labs  ?Lab 05/18/21 ?4944 05/19/21 ?0701 05/19/21 ?0726 05/19/21 ?9675 05/19/21 ?1019 05/19/21 ?1950 05/20/21 ?0131 05/20/21 ?0433  ?NA 128* 124* 121*   < > 125*  --  131* 131*  ?K 5.0 7.4* 7.3*   < > 6.8* 4.0 4.3 4.5  ?CL 89* 86* 93*  --  83*  --  92*  --   ?CO2 23 18*  --   --  24  --  25  --   ?GLUCOSE 100* 102* 99  --  95  --  133*  --   ?BUN 37* 49* 67*  --  50*  --  21*  --   ?CREATININE 11.37* 12.52* 13.90*  --  12.24*  --  6.45*  --   ?CALCIUM 9.3 9.2  --   --  9.7  --  8.8*  --   ?PHOS 8.4*  --   --   --  9.7*  --  5.6*  --   ? < > = values in this interval not displayed.  ? ?Liver Function Tests: ?Recent Labs  ?Lab 05/17/21 ?1754 05/18/21 ?0642 05/19/21 ?0701 05/19/21 ?1019  ?AST 27  --  40 31  ?ALT 16  --  14 17  ?ALKPHOS 75  --  70 70  ?BILITOT 0.7  --  0.8 0.6  ?PROT 7.0  --  6.6 6.5  ?ALBUMIN 3.9 3.4* 3.9 3.8  ? ?Recent Labs  ?Lab 05/19/21 ?1019  ?LIPASE 38  ?AMYLASE  68  ? ?CBC: ?Recent Labs  ?Lab 05/17/21 ?0124 05/17/21 ?0128 05/17/21 ?1754 05/19/21 ?0701 05/19/21 ?9163 05/19/21 ?8466 05/20/21 ?0131 05/20/21 ?5993  ?WBC 7.5  --  6.0 7.8  --   --  3.7*  --   ?NEUTROABS 4.3  --  2.8 5.2  --   --   --   --   ?HGB 11.8*   < > 12.1 11.7*   < > 11.6* 10.4* 10.9*  ?HCT 34.6*   < > 34.7* 33.3*   < > 34.0* 29.1* 32.0*  ?MCV 83.0  --  82.6 81.6  --   --  79.5*  --   ?PLT 212  --  227 234  --   --  177  --   ? < > = values in  this interval not displayed.  ? ?Blood Culture ?   ?Component Value Date/Time  ? SDES BLOOD RIGHT HAND 05/19/2021 0954  ? SPECREQUEST  05/19/2021 0954  ?  BOTTLES DRAWN AEROBIC ONLY Blood Culture adequate volume  ? CULT  05/19/2021 0954  ?  NO GROWTH < 24 HOURS ?Performed at [facility], [place] [address], [place], [place] [address] ?  ? REPTSTATUS PENDING 05/19/2021 0954  ? ? ?Cardiac Enzymes: ?No results for input(s): CKTOTAL, CKMB, CKMBINDEX, TROPONINI in the last 168 hours. ?CBG: ?Recent Labs  ?Lab 05/20/21 ?1057 05/20/21 ?1503 05/20/21 ?2005 05/21/21 ?0001 05/21/21 ?0402  ?GLUCAP 117* 83 127* 95 89  ? ?Iron Studies: No results for input(s): IRON, TIBC, TRANSFERRIN, FERRITIN in the last 72 hours. ?Lab Results  ?Component Value Date  ? INR 1.0 05/19/2021  ? INR 1.0 03/14/2021  ? INR 1.0 04/18/2019  ? ?Studies/Results: ?DG Abd 1 View ? ?Result Date: 05/19/2021 ?CLINICAL DATA:  Enteric tube placement EXAM: ABDOMEN - 1 VIEW COMPARISON:  04/08/2019 FINDINGS: Tip of enteric tube is seen in the region of second portion of duodenum. Bowel gas pattern is nonspecific. IMPRESSION: Tip of NG tube is seen in the region of duodenum. Electronically Signed   By: Elmer Picker M.D.   On: 05/19/2021 10:05   ? ?Medications: ? sodium chloride    ? ? chlorhexidine gluconate (MEDLINE KIT)  15 mL Mouth Rinse BID  ? Chlorhexidine Gluconate Cloth  6 each Topical Q0600  ? fentaNYL (SUBLIMAZE) injection  50 mcg Intravenous Once  ? folic acid  1 mg Per Tube Daily  ? heparin  injection (subcutaneous)  5,000 Units Subcutaneous Q8H  ? mouth rinse  15 mL Mouth Rinse 10 times per day  ? pantoprazole sodium  40 mg Per Tube Daily  ? thiamine  100 mg Per Tube Daily  ? ? ?Dialysis Orders: ?TTS - [facility] ?3.5hrs, BFR 400, DFR 600,  EDW 79kg, 2K/ 2.5Ca ?Access: LU AVF  ?Heparin none ?Mircera 150 mcg q2wks - last given 12/29/20 ?Hectorol 41mg IV qHD  ? ?Assessment/Plan: ?AMS - Admitted and intubated d/t ingested multiple substances including an unprescribed anxiolytic and CBD gummies. Patient now downgraded, on RA-O2 sats stable. Managed by primary ? ESRD -  on HD TTS. Given hyperkalemia resolved. HD 3/15 UF 1.5L, HD 3/16 UF 1.7L. Plan for HD 3/18 per usual schedule. ?  ? BP/volume - Hypertensive at times but certainly partially from hypervolemia as bp much better after UF.  ? Anemia of CKD - Hgb 10.9. No indication for ESA at this time.  Follow labs. ? Secondary Hyperparathyroidism -  Check Ca; phos 8.4 (h/o [place] as outpt) -> binders restarted, PO4 almost to goal. Monitor trend. ?Lupus - no recent flares ?H/o Cardiac mass -  Per cardiology to f/u in OP setting.  ? ?CTobie Poet NP ?[facility] ?05/21/2021,9:02 AM ? LOS: 2 days  ?  ?

## 2021-05-21 NOTE — Progress Notes (Signed)
? ?NAME:  Latasha Drake, MRN:  [id number], DOB:  03-18-1980, LOS: 2 ?ADMISSION DATE:  05/19/2021, CONSULTATION DATE: 05/19/2021 ?REFERRING MD: Emergency department physician, CHIEF COMPLAINT: Altered mental status, respiratory failure, hyperkalemia, substance abuse. ? ?History of Present Illness:  ?41 year old female who has had 3 admissions and 3 days of emergency [facility] for altered mental status.  She has end-stage renal disease receives dialysis on Tuesdays Thursdays and Saturdays and has missed at least 1 session.  Noted hyperkalemia potassium greater than 7 altered mental status and she was intubated per the emergency department physician.  Note chest x-ray revealed right mainstem intubation which is subsequently tube has been pulled back and repeat chest x-ray has been ordered.  Due to her hypokalemia, hypertension, altered mental status and intubation she has been admitted to the intensive care unit into pulmonary critical care medicine's service.  She needs urgent dialysis nephrology contacted. ? ?Pertinent  Medical History  ? ?Past Medical History:  ?Diagnosis Date  ? Anemia   ? Chronic kidney disease   ? Hypertension   ? Lupus ([place])   ? Pericardial effusion 04/13/2019  ? circumferential  ? Pneumonia 04/12/2019  ? Pulmonary hypertension ([place]) 04/13/2019  ? moderate  ? ? ? ?Significant Hospital Events: ?Including procedures, antibiotic start and stop dates in addition to other pertinent events   ?05/19/2021 intubation ?05/20/2018 3 repeat CT of head ? ?Interim History / Subjective:  ? ?No significant overnight events. ? ?Hemodynamically stable overnight.  ? ?Morning labs reviewed and notable for:  ?Na 121>>131, K 7.4>>4.3 ?ABG pH 7.55, pCO2 32 ? ?Objective   ?Blood pressure (!) 178/104, pulse 98, temperature 98.9 ?F (37.2 ?C), temperature source Oral, resp. rate 17, height 5\' 4"  (1.626 m), weight 83 kg, SpO2 100 %. ?   ?Vent Mode: PSV;CPAP ?FiO2 (%):  [40 %] 40 % ?PEEP:  [5 cmH20] 5  cmH20 ?Pressure Support:  [5 cmH20] 5 cmH20 ?Plateau Pressure:  [20 cmH20] 20 cmH20  ? ?Intake/Output Summary (Last 24 hours) at 05/21/2021 1010 ?Last data filed at 05/20/2021 2230 ?Gross per 24 hour  ?Intake 649.63 ml  ?Output 1701 ml  ?Net -1051.37 ml  ? ?Filed Weights  ? 05/20/21 1020 05/20/21 1901 05/21/21 0446  ?Weight: 86.8 kg 89.3 kg 83 kg  ? ? ?Examination: ?Awake alert no acute distress ready to go home ?Staples remain intact that should be removed in 14 days arrangements have been made ?No JVD or lymphadenopathy is appreciated ?Lungs clear heart sounds regular ?Abdomen obese soft nontender ?Warm ? ?Resolved Hospital Problem list   ?Hyperkalemia  ? ?Assessment & Plan:  ?Ventilator dependent respiratory failure in the setting of acute toxic/metabolic encepahlopathy due to non-adherence to HD dependent ESRD  ?-UDS negative on admission however suspect toxic component attributable to an illicit substance, likely fentanyl, present in gummies from [place] ? ?Intubated on 05/20/2021 due to altered mental status ?Room air currently on ?No respiratory distress ?Ready for discharge ?She is to follow-up with Suzanna Obey, MD on 06/02/2021 at 11:30 AM ? ? ? ?Head injury with negative CT ?She will need her head STAPLES removed and has been arranged with her primary care physician on 06/02/2021. ? ? ?End-stage renal disease ?She requires dialysis Tuesdays Thursdays and Saturdays and has arrangements to go Saturday as an outpatient ?He is following her ? ? ?Anemia ?Recent Labs  ?  05/20/21 ?0131 05/20/21 ?0433  ?HGB 10.4* 10.9*  ? ?Monitor ? ?After HD is complete this morning, wean sedation and do SBT.  Hopeful to be able to extubate if tolerated ?Bronchodilators as needed ?Nephrology following for HD needs ? ?Hypertension hyperlipidemia per her primary care physician ? ? ?Best Practice (right click and "Reselect all SmartList Selections" daily)  ? ?Diet/type: NPO ?DVT prophylaxis: SCD ?GI prophylaxis: PPI ?Lines: N/A ?Foley:   N/A ?Code Status:  full code ?Last date of multidisciplinary goals of care discussion [no family at bedside 05/19/21] ? ?Richardson Landry Azia Toutant ACNP ?Acute Care Nurse Practitioner ?[place] ?Please consult Amion ?05/21/2021, 10:10 AM ? ?  ?

## 2021-05-21 NOTE — Discharge Summary (Signed)
Physician Discharge Summary  ?Patient ID: ?Latasha Drake ?MRN: [id number] ?DOB/AGE: August 13, 1980 41 y.o. ? ?Admit date: 05/19/2021 ?Discharge date: 05/21/2021 ? ?Problem List ?Principal Problem: ?  AMS (altered mental status) ? ?HPI: ?41 year old female who has had 3 admissions and 3 days of emergency department [facility] for altered mental status.  She has end-stage renal disease receives dialysis on Tuesdays Thursdays and Saturdays and has missed at least 1 session.  Noted hyperkalemia potassium greater than 7 altered mental status and she was intubated per the emergency department physician.  Note chest x-ray revealed right mainstem intubation which is subsequently tube has been pulled back and repeat chest x-ray has been ordered.  Due to her hypokalemia, hypertension, altered mental status and intubation she has been admitted to the intensive care unit into pulmonary critical care medicine's service.  She needs urgent dialysis nephrology contacted. ?  ?Hospital Course: ?Past Medical History:  ?Diagnosis Date  ? Anemia    ? Chronic kidney disease    ? Hypertension    ? Lupus ([place])    ? Pericardial effusion 04/13/2019  ?  circumferential  ? Pneumonia 04/12/2019  ? Pulmonary hypertension ([place]) 04/13/2019  ?  moderate  ?  ?  ?  ?Significant Hospital Events: ?Including procedures, antibiotic start and stop dates in addition to other pertinent events   ?05/19/2021 intubation ?05/20/2018 3 repeat CT of head ?3/16 extubated ?3/17 dc home ?  ?Examination: ?Awake alert no acute distress ready to go home ?Staples remain intact that should be removed in 14 days arrangements have been made ?No JVD or lymphadenopathy is appreciated ?Lungs clear heart sounds regular ?Abdomen obese soft nontender ?Warm ? ?Assessment & Plan:  ?Ventilator dependent respiratory failure in the setting of acute toxic/metabolic encepahlopathy due to non-adherence to HD dependent ESRD  ?-UDS negative on admission however suspect toxic component  attributable to an illicit substance, likely fentanyl, present in gummies from [place] ?  ?Intubated on 05/20/2021 due to altered mental status ?Room air currently on ?No respiratory distress ?Ready for discharge ?She is to follow-up with Suzanna Obey, MD on 06/02/2021 at 11:30 AM ?  ?  ?  ?Head injury with negative CT ?She will need her head STAPLES removed and has been arranged with her primary care physician on 06/02/2021. ?  ?End-stage renal disease ?She requires dialysis Tuesdays Thursdays and Saturdays and has arrangements to go Saturday as an outpatient ?Renal is following her ?  ?  ?Anemia ?Recent Labs (last 2 labs)   ?    ?Recent Labs  ?  05/20/21 ?0131 05/20/21 ?0433  ?HGB 10.4* 10.9*  ?  ?  ?Monitor ?  ?After HD is complete this morning, wean sedation and do SBT. Hopeful to be able to extubate if tolerated ?Bronchodilators as needed ?Nephrology following for HD needs ?  ?Hypertension hyperlipidemia per her primary care physician ?  ?Labs at discharge ?Lab Results  ?Component Value Date  ? CREATININE 6.45 (H) 05/20/2021  ? BUN 21 (H) 05/20/2021  ? NA 131 (L) 05/20/2021  ? K 4.5 05/20/2021  ? CL 92 (L) 05/20/2021  ? CO2 25 05/20/2021  ? ?Lab Results  ?Component Value Date  ? WBC 3.7 (L) 05/20/2021  ? HGB 10.9 (L) 05/20/2021  ? HCT 32.0 (L) 05/20/2021  ? MCV 79.5 (L) 05/20/2021  ? PLT 177 05/20/2021  ? ?Lab Results  ?Component Value Date  ? ALT 17 05/19/2021  ? AST 31 05/19/2021  ? ALKPHOS 70 05/19/2021  ? BILITOT 0.6 05/19/2021  ? ?  Lab Results  ?Component Value Date  ? INR 1.0 05/19/2021  ? INR 1.0 03/14/2021  ? INR 1.0 04/18/2019  ? ? ?Current radiology studies ?No results found. ? ?Disposition: ? Discharge disposition: 01-Home or Self Care ? ? ? ? ? ? ?Discharge Instructions   ? ? Call MD for:  redness, tenderness, or signs of infection (pain, swelling, redness, odor or green/yellow discharge around incision site)   Complete by: As directed ?  ? Call MD for:  temperature >100.4   Complete by: As directed ?  ?  Diet - low sodium heart healthy   Complete by: As directed ?  ? Increase activity slowly   Complete by: As directed ?  ? No dressing needed   Complete by: As directed ?  ? ?  ? ?Allergies as of 05/21/2021   ? ?   Reactions  ? Baclofen Other (See Comments)  ? Tremors  ? Lisinopril Swelling  ? Lips swelled  ? ?  ? ?  ?Medication List  ?  ? ?TAKE these medications   ? ?amLODipine 10 MG tablet ?Commonly known as: NORVASC ?Take one tablet (10 mg) by mouth in the morning on non-dialysis days - Sunday, Monday, Wednesday, Friday; take one tablet (10 mg) daily at bedtime ?What changed:  ?how much to take ?how to take this ?when to take this ?  ?aspirin EC 81 MG tablet ?Take 1 tablet (81 mg total) by mouth daily. Swallow whole. ?  ?atorvastatin 40 MG tablet ?Commonly known as: LIPITOR ?Take 1 tablet (40 mg total) by mouth daily. ?  ?clotrimazole 1 % external solution ?Commonly known as: LOTRIMIN ?1 application. See admin instructions. Instill 5 drops into right ear twice daily until healed PRN ?  ?cyclobenzaprine 10 MG tablet ?Commonly known as: FLEXERIL ?Take 10 mg by mouth 3 (three) times daily. ?  ?labetalol 300 MG tablet ?Commonly known as: NORMODYNE ?Take 300 mg by mouth 2 (two) times daily. ?  ?pantoprazole 40 MG tablet ?Commonly known as: PROTONIX ?Take 40 mg by mouth daily. ?  ?paragard intrauterine copper Iud IUD ?1 Intra Uterine Device by Intrauterine route once. ?  ?pregabalin 75 MG capsule ?Commonly known as: LYRICA ?Take 375 mg by mouth daily. ?  ?rOPINIRole 0.5 MG tablet ?Commonly known as: REQUIP ?Take 0.5 mg by mouth 2 (two) times daily. ?  ?sertraline 50 MG tablet ?Commonly known as: ZOLOFT ?Take 50 mg by mouth daily. ?  ?sevelamer carbonate 800 MG tablet ?Commonly known as: RENVELA ?Take 1,600 mg by mouth 2 (two) times daily with a meal. ?  ?varenicline 0.5 MG tablet ?Commonly known as: CHANTIX ?Take 0.5 mg by mouth daily. ?  ? ?  ? ?  ?  ? ? ?  ?Discharge Care Instructions  ?(From admission, onward)  ?   ? ? ?  ? ?  Start     Ordered  ? 05/21/21 0000  No dressing needed       ? 05/21/21 1021  ? ?  ?  ? ?  ? ? Follow-up Information   ? ? Katherina Mires, MD Follow up on 06/02/2021.   ?Specialty: Family Medicine ?Why: 11:30 with Dr. Doreene Nest ?Staples to be removed  ?Follow up hemodialysis saturday 05/22/21 ?Contact information: ?[address] ?[place] [address] ?[phone] ? ? ?  ?  ? ?  ?  ? ?  ? ? ? ?Discharged Condition: fair ? ?Time spent on discharge  40 minutes. ? ?Vital signs  at Discharge. ?Temp:  [98.1 ?F (36.7 ?C)-99 ?F (37.2 ?C)] 98.9 ?F (37.2 ?C) (03/17 2081) ?Pulse Rate:  [81-106] 98 (03/17 0924) ?Resp:  [15-24] 17 (03/17 0924) ?BP: (128-178)/(69-111) 178/104 (03/17 0924) ?SpO2:  [95 %-100 %] 100 % (03/17 0924) ?FiO2 (%):  [40 %] 40 % (03/16 1133) ?Weight:  [83 kg-89.3 kg] 83 kg (03/17 0446) ?Office follow up Special Information or instructions. ?No need to follow-up with pulmonary critical care her staples were removed at her primary care physician's office ? ?Signed: ?Gaylyn Lambert ACNP ?Acute Care Nurse Practitioner ?[place] ?Please consult Amion ?05/21/2021, 10:24 AM ? ? ? ? ?

## 2021-05-21 NOTE — Care Management Important Message (Signed)
Important Message ? ?Patient Details  ?Name: Latasha Drake ?MRN: [id number] ?Date of Birth: October 21, 1980 ? ? ?Medicare Important Message Given:  Yes ? ?Patient left prior to IM delivery will mail IM to the patient home address.  ? ? ? ?[name] ?05/21/2021, 3:03 PM ?

## 2021-05-22 NOTE — TOC Transition Note (Signed)
Transition of care contact from inpatient facility ? ?Date of discharge: 05/21/21 ?Date of contact: 05/22/21 ?Method: Phone ?Spoke to: Patient ? ?Patient contacted to discuss transition of care from recent inpatient hospitalization. Patient was admitted to [facility] from 05/19/21-05/21/21 with discharge diagnosis of altered mental status d/t taking an illicit substance. ? ?Medication changes were reviewed. ? ?Patient reports not going to her HD today (05/22/21). She will follow up with her outpatient HD unit on: Tuesday 05/24/21. ? ?Tobie Poet, NP ?  ?

## 2021-05-24 LAB — CULTURE, BLOOD (ROUTINE X 2)
Culture: NO GROWTH
Culture: NO GROWTH
Special Requests: ADEQUATE
Special Requests: ADEQUATE

## 2022-03-06 ENCOUNTER — Observation Stay (HOSPITAL_COMMUNITY)
Admission: EM | Admit: 2022-03-06 | Discharge: 2022-03-06 | Disposition: A | Discharge status: Home / Self Care | Payer: Medicare Other | Attending: Internal Medicine | Admitting: Internal Medicine

## 2022-03-06 ENCOUNTER — Observation Stay (HOSPITAL_COMMUNITY): Payer: Medicare Other

## 2022-03-06 ENCOUNTER — Encounter (HOSPITAL_COMMUNITY): Payer: Self-pay | Admitting: Internal Medicine

## 2022-03-06 DIAGNOSIS — Z79899 Other long term (current) drug therapy: Secondary | ICD-10-CM | POA: Insufficient documentation

## 2022-03-06 DIAGNOSIS — F17210 Nicotine dependence, cigarettes, uncomplicated: Secondary | ICD-10-CM | POA: Insufficient documentation

## 2022-03-06 DIAGNOSIS — N186 End stage renal disease: Secondary | ICD-10-CM | POA: Diagnosis not present

## 2022-03-06 DIAGNOSIS — D631 Anemia in chronic kidney disease: Secondary | ICD-10-CM | POA: Insufficient documentation

## 2022-03-06 DIAGNOSIS — I120 Hypertensive chronic kidney disease with stage 5 chronic kidney disease or end stage renal disease: Secondary | ICD-10-CM | POA: Insufficient documentation

## 2022-03-06 DIAGNOSIS — R55 Syncope and collapse: Principal | ICD-10-CM

## 2022-03-06 DIAGNOSIS — Z992 Dependence on renal dialysis: Secondary | ICD-10-CM | POA: Diagnosis not present

## 2022-03-06 DIAGNOSIS — E872 Acidosis, unspecified: Secondary | ICD-10-CM

## 2022-03-06 DIAGNOSIS — M329 Systemic lupus erythematosus, unspecified: Secondary | ICD-10-CM | POA: Diagnosis not present

## 2022-03-06 DIAGNOSIS — Z7982 Long term (current) use of aspirin: Secondary | ICD-10-CM | POA: Insufficient documentation

## 2022-03-06 DIAGNOSIS — N189 Chronic kidney disease, unspecified: Secondary | ICD-10-CM

## 2022-03-06 DIAGNOSIS — Z862 Personal history of diseases of the blood and blood-forming organs and certain disorders involving the immune mechanism: Secondary | ICD-10-CM

## 2022-03-06 DIAGNOSIS — I959 Hypotension, unspecified: Secondary | ICD-10-CM

## 2022-03-06 LAB — CBC WITH DIFFERENTIAL/PLATELET
Abs Immature Granulocytes: 0.04 10*3/uL (ref 0.00–0.07)
Basophils Absolute: 0 10*3/uL (ref 0.0–0.1)
Basophils Relative: 1 %
Eosinophils Absolute: 0.1 10*3/uL (ref 0.0–0.5)
Eosinophils Relative: 2 %
HCT: 27.7 % — ABNORMAL LOW (ref 36.0–46.0)
Hemoglobin: 9.6 g/dL — ABNORMAL LOW (ref 12.0–15.0)
Immature Granulocytes: 1 %
Lymphocytes Relative: 28 %
Lymphs Abs: 1.9 10*3/uL (ref 0.7–4.0)
MCH: 28.6 pg (ref 26.0–34.0)
MCHC: 34.7 g/dL (ref 30.0–36.0)
MCV: 82.4 fL (ref 80.0–100.0)
Monocytes Absolute: 0.7 10*3/uL (ref 0.1–1.0)
Monocytes Relative: 10 %
Neutro Abs: 4.1 10*3/uL (ref 1.7–7.7)
Neutrophils Relative %: 58 %
Platelets: 205 10*3/uL (ref 150–400)
RBC: 3.36 MIL/uL — ABNORMAL LOW (ref 3.87–5.11)
RDW: 14.6 % (ref 11.5–15.5)
WBC: 7 10*3/uL (ref 4.0–10.5)
nRBC: 0 % (ref 0.0–0.2)

## 2022-03-06 LAB — HEMOGLOBIN AND HEMATOCRIT, BLOOD
HCT: 23.1 % — ABNORMAL LOW (ref 36.0–46.0)
Hemoglobin: 7.9 g/dL — ABNORMAL LOW (ref 12.0–15.0)

## 2022-03-06 LAB — COMPREHENSIVE METABOLIC PANEL
ALT: 13 U/L (ref 0–44)
AST: 23 U/L (ref 15–41)
Albumin: 3.5 g/dL (ref 3.5–5.0)
Alkaline Phosphatase: 73 U/L (ref 38–126)
Anion gap: 15 (ref 5–15)
BUN: 20 mg/dL (ref 6–20)
CO2: 26 mmol/L (ref 22–32)
Calcium: 8.9 mg/dL (ref 8.9–10.3)
Chloride: 96 mmol/L — ABNORMAL LOW (ref 98–111)
Creatinine, Ser: 5.74 mg/dL — ABNORMAL HIGH (ref 0.44–1.00)
GFR, Estimated: 9 mL/min — ABNORMAL LOW (ref >60)
Glucose, Bld: 104 mg/dL — ABNORMAL HIGH (ref 70–99)
Potassium: 3.8 mmol/L (ref 3.5–5.1)
Sodium: 137 mmol/L (ref 135–145)
Total Bilirubin: 0.6 mg/dL (ref 0.3–1.2)
Total Protein: 6.5 g/dL (ref 6.5–8.1)

## 2022-03-06 LAB — LACTIC ACID, PLASMA
Lactic Acid, Venous: 2.1 mmol/L (ref 0.5–1.9)
Lactic Acid, Venous: 0.8 mmol/L (ref 0.5–1.9)

## 2022-03-06 LAB — TYPE AND SCREEN
ABO/RH(D): O POS
Antibody Screen: NEGATIVE

## 2022-03-06 MED ORDER — LACTATED RINGERS IV BOLUS
1000.0000 mL | Freq: Once | INTRAVENOUS | Status: AC
  Administered 2022-03-06: 1000 mL via INTRAVENOUS

## 2022-03-06 MED ORDER — PREGABALIN 25 MG PO CAPS: 150.0000 mg | ORAL_CAPSULE | Freq: Two times a day (BID) | ORAL | Status: DC

## 2022-03-06 MED ORDER — ROPINIROLE HCL 1 MG PO TABS: 0.5000 mg | ORAL_TABLET | Freq: Two times a day (BID) | ORAL | Status: DC

## 2022-03-06 MED ORDER — ENOXAPARIN SODIUM 40 MG/0.4ML IJ SOSY: 40.0000 mg | PREFILLED_SYRINGE | INTRAMUSCULAR | Status: DC

## 2022-03-06 MED ORDER — ONDANSETRON HCL 4 MG PO TABS: 4.0000 mg | ORAL_TABLET | Freq: Four times a day (QID) | ORAL | Status: DC | PRN

## 2022-03-06 MED ORDER — SODIUM CHLORIDE 0.9% FLUSH
3.0000 mL | Freq: Two times a day (BID) | INTRAVENOUS | Status: DC
  Administered 2022-03-06: 3 mL via INTRAVENOUS

## 2022-03-06 MED ORDER — ACETAMINOPHEN 325 MG PO TABS: 650.0000 mg | ORAL_TABLET | Freq: Four times a day (QID) | ORAL | Status: DC | PRN

## 2022-03-06 MED ORDER — SEVELAMER CARBONATE 800 MG PO TABS: 1600.0000 mg | ORAL_TABLET | Freq: Two times a day (BID) | ORAL | Status: DC

## 2022-03-06 MED ORDER — SERTRALINE HCL 50 MG PO TABS: 50.0000 mg | ORAL_TABLET | Freq: Every day | ORAL | Status: DC

## 2022-03-06 MED ORDER — CYCLOBENZAPRINE HCL 10 MG PO TABS: 10.0000 mg | ORAL_TABLET | Freq: Three times a day (TID) | ORAL | Status: DC | PRN

## 2022-03-06 MED ORDER — LACTATED RINGERS IV SOLN: INTRAVENOUS | Status: DC

## 2022-03-06 MED ORDER — ONDANSETRON HCL 4 MG/2ML IJ SOLN: 4.0000 mg | Freq: Four times a day (QID) | INTRAMUSCULAR | Status: DC | PRN

## 2022-03-06 MED ORDER — ROPINIROLE HCL 1 MG PO TABS
3.0000 mg | ORAL_TABLET | Freq: Every day | ORAL | Status: DC
  Administered 2022-03-06: 3 mg via ORAL
  Filled 2022-03-06: qty 3

## 2022-03-06 MED ORDER — ACETAMINOPHEN 650 MG RE SUPP: 650.0000 mg | Freq: Four times a day (QID) | RECTAL | Status: DC | PRN

## 2022-03-06 MED ORDER — PREGABALIN 25 MG PO CAPS
150.0000 mg | ORAL_CAPSULE | Freq: Every day | ORAL | Status: DC
  Administered 2022-03-06: 150 mg via ORAL
  Filled 2022-03-06: qty 2

## 2022-03-06 MED ORDER — PHENYLEPHRINE 80 MCG/ML (10ML) SYRINGE FOR IV PUSH (FOR BLOOD PRESSURE SUPPORT): 80.0000 ug | PREFILLED_SYRINGE | Freq: Once | INTRAVENOUS | Status: AC | PRN

## 2022-03-06 MED ORDER — FENTANYL CITRATE PF 50 MCG/ML IJ SOSY
50.0000 ug | PREFILLED_SYRINGE | Freq: Once | INTRAMUSCULAR | Status: AC
  Administered 2022-03-06: 50 ug via INTRAVENOUS
  Filled 2022-03-06: qty 1

## 2022-03-06 MED ORDER — ENOXAPARIN SODIUM 30 MG/0.3ML IJ SOSY: 30.0000 mg | PREFILLED_SYRINGE | INTRAMUSCULAR | Status: DC

## 2022-03-06 MED ORDER — PHENYLEPHRINE 80 MCG/ML (10ML) SYRINGE FOR IV PUSH (FOR BLOOD PRESSURE SUPPORT)
PREFILLED_SYRINGE | INTRAVENOUS | Status: AC
  Administered 2022-03-06: 160 ug via INTRAVENOUS
  Filled 2022-03-06: qty 10

## 2022-03-06 MED ORDER — ALBUTEROL SULFATE (2.5 MG/3ML) 0.083% IN NEBU: 2.5000 mg | INHALATION_SOLUTION | Freq: Four times a day (QID) | RESPIRATORY_TRACT | Status: DC | PRN

## 2022-03-06 MED ORDER — ALBUMIN HUMAN 25 % IV SOLN
25.0000 g | Freq: Once | INTRAVENOUS | Status: AC
  Administered 2022-03-06: 25 g via INTRAVENOUS
  Filled 2022-03-06: qty 100

## 2022-03-06 MED ORDER — PANTOPRAZOLE SODIUM 40 MG PO TBEC: 40.0000 mg | DELAYED_RELEASE_TABLET | Freq: Every day | ORAL | Status: DC

## 2022-03-06 MED ORDER — BUSPIRONE HCL 10 MG PO TABS: 5.0000 mg | ORAL_TABLET | Freq: Three times a day (TID) | ORAL | Status: DC

## 2022-03-06 NOTE — Discharge Summary (Signed)
Physician Discharge Summary   Patient: Latasha Drake MRN: [id number] DOB: 1980-11-22  Admit date:     03/06/2022  Discharge date: 03/06/22  Discharge Physician: Norval Morton   PCP: Katherina Mires, MD   Recommendations at discharge:   Continue outpatient follow-up with nephrology to establish new dry weight of at least 73.5 kg. Follow-up in outpatient setting and ensuring hemoglobin remained stable  Discharge Diagnoses: Principal Problem:   Syncope Active Problems:   Lupus ([place])   ESRD on dialysis ([place])   Lactic acidosis   History of anemia due to chronic kidney disease  Resolved Problems:   Hypotension  Hospital Course:  Latasha Drake is a 41 y.o. 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  She was sitting at the time and felt dizzy prior to passing out.  Denied falling or hitting her head.  She had not been on any blood pressure medications since starting hemodialysis.  Patient denied having any fever, chest pain, nausea, vomiting, diarrhea, or recent sick contacts to her knowledge. Over the last 2 months she has been in training for home hemodialysis and admits that she had been having issues with taking off too much fluid.  She states that currently her dry weight was set at 71.5 kg and with the holidays she thinks she may have overdid it.  She has been dialyzing at home on Monday, Tuesday, Thursday, Friday schedule.  EMS noted patient's initial blood pressure be 62/36.  Patient was given 900 cc with improvement up to 96/53 with heart rate 106.   In the emergency department patient was noted to be afebrile with pulse elevated up to 115, respirations 16-24, blood pressures initially as low as 70/48 with improvement after IV fluid bolus with albumin.  Labs noted hemoglobin 9.6, potassium 3.8, BUN 20, creatinine 5.74, and lactic acid  2.1->0.8.  Bedside ultrasound had revealed inspiratory collapse of the IVC. Patient had been given fentanyl 50 mcg IV, 2 L bolus of IV fluids, albumin 25 g, and had initially been ordered phenyepinephrine IV temporarily. Nephrology had been formally consulted.     Assessment and Plan: Syncope secondary to hypotension Patient presents after having episode of syncope after dialysis on 12/30.  Blood pressures initially reported to be as low as 70/48.  Bedside ultrasound noted respiratory collapse of the IVC for which patient was thought to be hypovolemic.  Patient has been given at least 2 to 3 L of IV fluids and 25 g of albumin with improvement in pressures.  She was temporarily placed on phenyl epinephrine.  Patient has been taken off of all blood pressure medications after starting hemodialysis.  Blood pressure is currently now stable.  Thought secondary to patient taking too much fluid off with hemodialysis.  Orthostatic vital signs were rechecked and noted to be stable.   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  Labs noted currently on home hemodialysis schedule of Monday, Tuesday, Thursday, and Friday.  Patient had dry weight at 71.5 kg, but after discussions with Dr. Jonnie Finner of nephrology recommended increasing patient's dry weight to at least 73.5 kg, but we will need to follow-up with Dr. Hollie Salk of nephrology in outpatient setting to establish new dry weight.   Lactic acidosis Resolved.  Initial lactic acid elevated 2.1, but repeat check within normal limits after fluid resuscitation.   Anemia  of chronic kidney disease Acute on chronic.  Hemoglobin 9.6, but previously had been 10 -11 g/dL in March of this year.  Repeat hemoglobin 7.9 after significant IV fluids resuscitation for which it is suspectdilutional effect.  Patient denies any reports of bleeding.       Consultants: Nephrology Procedures performed:  None Disposition: Home Diet recommendation:  Renal diet DISCHARGE MEDICATION: Allergies as of 03/06/2022       Reactions   Lisinopril Swelling, Other (See Comments)   Lips swelled   Baclofen Other (See Comments)   Tremors        Medication List     TAKE these medications    busPIRone 5 MG tablet Commonly known as: BUSPAR Take 5 mg by mouth 3 (three) times daily as needed (for anxiety).   cyclobenzaprine 10 MG tablet Commonly known as: FLEXERIL Take 10 mg by mouth 3 (three) times daily as needed for muscle spasms.   diazepam 2 MG tablet Commonly known as: VALIUM Take 2 mg by mouth See admin instructions. Take 2 mg by mouth as needed for scans   Lokelma 10 g Pack packet Generic drug: sodium zirconium cyclosilicate Take 10 g by mouth daily.   ondansetron 4 MG tablet Commonly known as: ZOFRAN Take 4 mg by mouth 2 (two) times daily as needed for nausea or vomiting.   pantoprazole 40 MG tablet Commonly known as: PROTONIX Take 40 mg by mouth daily before breakfast.   paragard intrauterine copper Iud IUD 1 Intra Uterine Device by Intrauterine route once.   pregabalin 150 MG capsule Commonly known as: LYRICA Take 150 mg by mouth at bedtime. What changed: Another medication with the same name was removed. Continue taking this medication, and follow the directions you see here.   rOPINIRole 3 MG tablet Commonly known as: REQUIP Take 3 mg by mouth at bedtime. What changed: Another medication with the same name was removed. Continue taking this medication, and follow the directions you see here.   sertraline 50 MG tablet Commonly known as: ZOLOFT Take 50 mg by mouth at bedtime.   sevelamer carbonate 800 MG tablet Commonly known as: RENVELA Take 2,400 mg by mouth 3 (three) times daily with meals.        Discharge Exam: Filed Weights   03/06/22 0528  Weight: 83 kg   Orthostatic vitals stable. Middle-aged female currently in no acute distress Regular rate  and rhythm without significant murmur appreciated.  Left upper extremity fistula present with palpable thrill. Normal respiratory effort without significant wheezes or rhonchi appreciated.  O2 saturations maintained on room air. Patient alert and oriented x 3.  Pleasant mood The results of significant diagnostics from this hospitalization (including imaging, microbiology, ancillary and laboratory) are listed below for reference.   Imaging Studies: DG CHEST PORT 1 VIEW  Result Date: 03/06/2022 CLINICAL DATA:  Syncope. EXAM: PORTABLE CHEST 1 VIEW COMPARISON:  05/19/2021 and prior radiographs FINDINGS: UPPER limits normal heart size and elevated RIGHT hemidiaphragm again noted. Mild subsegmental atelectasis/scarring in the RIGHT LOWER lung noted. There is no evidence of focal airspace disease, pulmonary edema, suspicious pulmonary nodule/mass, pleural effusion, or pneumothorax. No acute bony abnormalities are identified. IMPRESSION: Mild RIGHT LOWER lung subsegmental atelectasis/scarring. Electronically Signed   By: Margarette Canada M.D.   On: 03/06/2022 09:57    Microbiology: Results for orders placed or performed during the hospital encounter of 05/19/21  Resp Panel by RT-PCR (Flu A&B, Covid) Nasopharyngeal Swab     Status: None   Collection Time:  05/19/21  7:34 AM   Specimen: Nasopharyngeal Swab; Nasopharyngeal(NP) swabs in vial transport medium  Result Value Ref Range Status   SARS Coronavirus 2 by RT PCR NEGATIVE NEGATIVE Final    Comment: (NOTE) SARS-CoV-2 target nucleic acids are NOT DETECTED.  The SARS-CoV-2 RNA is generally detectable in upper respiratory specimens during the acute phase of infection. The lowest concentration of SARS-CoV-2 viral copies this assay can detect is 138 copies/mL. A negative result does not preclude SARS-Cov-2 infection and should not be used as the sole basis for treatment or other patient management decisions. A negative result may occur with  improper  specimen collection/handling, submission of specimen other than nasopharyngeal swab, presence of viral mutation(s) within the areas targeted by this assay, and inadequate number of viral copies(<138 copies/mL). A negative result must be combined with clinical observations, patient history, and epidemiological information. The expected result is Negative.  Fact Sheet for Patients:  EntrepreneurPulse.com.au  Fact Sheet for Healthcare Providers:  IncredibleEmployment.be  This test is no t yet approved or cleared by the [place] FDA and  has been authorized for detection and/or diagnosis of SARS-CoV-2 by FDA under an Emergency Use Authorization (EUA). This EUA will remain  in effect (meaning this test can be used) for the duration of the COVID-19 declaration under Section 564(b)(1) of the Act, 21 U.S.C.section 360bbb-3(b)(1), unless the authorization is terminated  or revoked sooner.       Influenza A by PCR NEGATIVE NEGATIVE Final   Influenza B by PCR NEGATIVE NEGATIVE Final    Comment: (NOTE) The Xpert Xpress SARS-CoV-2/FLU/RSV plus assay is intended as an aid in the diagnosis of influenza from Nasopharyngeal swab specimens and should not be used as a sole basis for treatment. Nasal washings and aspirates are unacceptable for Xpert Xpress SARS-CoV-2/FLU/RSV testing.  Fact Sheet for Patients: EntrepreneurPulse.com.au  Fact Sheet for Healthcare Providers: IncredibleEmployment.be  This test is not yet approved or cleared by the [place] FDA and has been authorized for detection and/or diagnosis of SARS-CoV-2 by FDA under an Emergency Use Authorization (EUA). This EUA will remain in effect (meaning this test can be used) for the duration of the COVID-19 declaration under Section 564(b)(1) of the Act, 21 U.S.C. section 360bbb-3(b)(1), unless the authorization is terminated or revoked.  Performed at  [facility], [place] [address], [place], [place] [address]   MRSA Next Gen by PCR, Nasal     Status: None   Collection Time: 05/19/21  9:23 AM   Specimen: BLOOD RIGHT HAND; Nasal Swab  Result Value Ref Range Status   MRSA by PCR Next Gen NOT DETECTED NOT DETECTED Final    Comment: (NOTE) The GeneXpert MRSA Assay (FDA approved for NASAL specimens only), is one component of a comprehensive MRSA colonization surveillance program. It is not intended to diagnose MRSA infection nor to guide or monitor treatment for MRSA infections. Test performance is not FDA approved in patients less than 37 years old. Performed at [facility], [place] [address], [place], [place] [address]   Culture, blood (routine x 2)     Status: None   Collection Time: 05/19/21  9:53 AM   Specimen: BLOOD RIGHT HAND  Result Value Ref Range Status   Specimen Description BLOOD RIGHT HAND  Final   Special Requests   Final    BOTTLES DRAWN AEROBIC ONLY Blood Culture adequate volume   Culture   Final    NO GROWTH 5 DAYS Performed at [facility]  Lab, [address] [place], [place] [address]    Report Status 05/24/2021 FINAL  Final  Culture, blood (routine x 2)     Status: None   Collection Time: 05/19/21  9:54 AM   Specimen: BLOOD RIGHT HAND  Result Value Ref Range Status   Specimen Description BLOOD RIGHT HAND  Final   Special Requests   Final    BOTTLES DRAWN AEROBIC ONLY Blood Culture adequate volume   Culture   Final    NO GROWTH 5 DAYS Performed at [facility], [place] [address] [place], [place] [address]    Report Status 05/24/2021 FINAL  Final    Labs: CBC: Recent Labs  Lab 03/06/22 0300 03/06/22 1001  WBC 7.0  --   NEUTROABS 4.1  --   HGB 9.6* 7.9*  HCT 27.7* 23.1*  MCV 82.4  --   PLT 205  --    Basic Metabolic Panel: Recent Labs  Lab 03/06/22 0300  NA 137  K 3.8  CL 96*  CO2 26  GLUCOSE 104*  BUN 20  CREATININE 5.74*  CALCIUM 8.9   Liver Function Tests: Recent  Labs  Lab 03/06/22 0300  AST 23  ALT 13  ALKPHOS 73  BILITOT 0.6  PROT 6.5  ALBUMIN 3.5   CBG: No results for input(s): "GLUCAP" in the last 168 hours.  Discharge time spent: less than 30 minutes.  Signed: Norval Morton, MD [facility] 03/06/2022

## 2022-03-06 NOTE — ED Provider Notes (Signed)
[facility] EMERGENCY DEPARTMENT Provider Note  CSN: [id number] Arrival date & time: 03/06/22 0234  Chief Complaint(s) Hypotension  HPI Latasha Drake is a 41 y.o. female with PMH ESRD on home dialysis secondary to SLE, HTN, restless leg syndrome who presents emergency department for evaluation of lightheadedness after completing dialysis session.  Patient states that over the last 1 month when she has been doing her home dialysis, she feels increasingly lightheaded towards the last 1 hour of her dialysis run.  Today, she passed out during her dialysis session and home blood pressures by EMS were found to be in the 60s.  Patient had positive response to fluid resuscitation and received 900 cc of fluid prior to arrival with blood pressure here in the ER 126/88.  Patient currently states that she feels much improved but her restless legs are more severe in nature.  Denies chest pain, shortness of breath, headache, fever or other systemic symptoms.   Past Medical History Past Medical History:  Diagnosis Date   Anemia    Chronic kidney disease    Hypertension    Lupus (HCC)    Pericardial effusion 04/13/2019   circumferential   Pneumonia 04/12/2019   Pulmonary hypertension ([place]) 04/13/2019   moderate   Patient Active Problem List   Diagnosis Date Noted   Syncope 03/06/2022   AMS (altered mental status) 05/19/2021   STEMI (ST elevation myocardial infarction) ([place]) 03/14/2021   Weakness 03/14/2021   Prediabetes 03/14/2021   Acute pulmonary edema (HCC)    Acute hypoxemic respiratory failure ([place]) 12/05/2020   Iron deficiency anemia, unspecified 09/15/2020   Pleural effusion    Acute respiratory failure with hypoxia ([place]) 04/19/2019   Chest pain 04/13/2019   ESRD on dialysis ([place]) 04/13/2019   Community acquired pneumonia 04/12/2019   MDD (major depressive disorder) 10/26/2018   Acute on chronic blood loss anemia 10/04/2018   Symptomatic anemia 10/04/2018   Lupus  (HCC)    Hypertension    Elevated d-dimer    Pericardial effusion    Traumatic perinephric hematoma of left kidney    Home Medication(s) Prior to Admission medications   Medication Sig Start Date End Date Taking? Authorizing Provider  amLODipine (NORVASC) 10 MG tablet Take one tablet (10 mg) by mouth in the morning on non-dialysis days - Sunday, Monday, Wednesday, Friday; take one tablet (10 mg) daily at bedtime Patient taking differently: Take 10 mg by mouth daily. Take one tablet (10 mg) by mouth in the morning on non-dialysis days - Sunday, Monday, Wednesday, Friday; take one tablet (10 mg) daily at bedtime 03/15/21   Bhagat, Crista Luria, PA  aspirin EC 81 MG tablet Take 1 tablet (81 mg total) by mouth daily. Swallow whole. 03/15/21   Bhagat, Crista Luria, PA  atorvastatin (LIPITOR) 40 MG tablet Take 1 tablet (40 mg total) by mouth daily. 03/16/21   Bhagat, Crista Luria, PA  clotrimazole (LOTRIMIN) 1 % external solution 1 application. See admin instructions. Instill 5 drops into right ear twice daily until healed PRN 03/10/21   [provider]  cyclobenzaprine (FLEXERIL) 10 MG tablet Take 10 mg by mouth 3 (three) times daily. 04/03/19   [provider]  labetalol (NORMODYNE) 300 MG tablet Take 300 mg by mouth 2 (two) times daily. 04/05/21   [provider]  pantoprazole (PROTONIX) 40 MG tablet Take 40 mg by mouth daily. 10/27/16   [provider]  paragard intrauterine copper IUD IUD 1 Intra Uterine Device by Intrauterine route once.    [provider]  pregabalin (LYRICA) 75 MG capsule Take 150 mg by mouth 2 (two) times daily. Prescribed as 2 capsules twice daily and 1 capsule at bedtime. Pt endorses taking all 5 capsules at one time everyday. 03/08/21   [provider]  pregabalin (LYRICA) 75 MG capsule Take 75 mg by mouth at bedtime. Prescribed as 2 capsules twice daily and 1 capsule at bedtime. Pt endorses taking all 5 capsules at one time everyday.     [provider]  rOPINIRole (REQUIP) 0.5 MG tablet Take 0.5 mg by mouth 2 (two) times daily. 02/01/21   [provider]  sertraline (ZOLOFT) 50 MG tablet Take 50 mg by mouth daily. 04/20/21   [provider]  sevelamer carbonate (RENVELA) 800 MG tablet Take 1,600 mg by mouth 2 (two) times daily with a meal. 08/26/20   [provider]  varenicline (CHANTIX) 0.5 MG tablet Take 0.5 mg by mouth daily. 04/20/21   [provider]                                                                                                                                    Past Surgical History Past Surgical History:  Procedure Laterality Date   CESAREAN SECTION     CORONARY/GRAFT ACUTE MI REVASCULARIZATION N/A 03/14/2021   Procedure: Coronary/Graft Acute MI Revascularization;  Surgeon: Troy Sine, MD;  Location: [facility];  Service: Cardiovascular;  Laterality: N/A;   HERNIA REPAIR     IR FLUORO GUIDE CV LINE RIGHT  04/19/2019   IR FLUORO GUIDE CV LINE RIGHT  04/22/2019   IR US GUIDE VASC ACCESS RIGHT  04/19/2019   IR US GUIDE VASC ACCESS RIGHT  04/22/2019   LEFT HEART CATH AND CORONARY ANGIOGRAPHY N/A 03/14/2021   Procedure: LEFT HEART CATH AND CORONARY ANGIOGRAPHY;  Surgeon: Troy Sine, MD;  Location: [facility];  Service: Cardiovascular;  Laterality: N/A;   Family History Family History  Problem Relation Age of Onset   Lupus Other        cousin    Social History Social History   Tobacco Use   Smoking status: Every Day    Packs/day: 0.50    Types: Cigarettes   Smokeless tobacco: Never  Substance Use Topics   Alcohol use: Yes    Comment: occassionally   Drug use: No   Allergies Baclofen and Lisinopril  Review of Systems Review of Systems  Neurological:  Positive for syncope and light-headedness.    Physical Exam Vital Signs  I have reviewed the triage vital signs BP 106/64   Pulse 80   Temp 98.4 F (36.9 C) (Oral)    Resp 19   Ht 5\' 3"  (1.6 m)   Wt 83 kg   SpO2 98%   BMI 32.41 kg/m   Physical Exam Vitals and nursing note reviewed.  Constitutional:      General: She is  not in acute distress.    Appearance: She is well-developed.  HENT:     Head: Normocephalic and atraumatic.  Eyes:     Conjunctiva/sclera: Conjunctivae normal.  Cardiovascular:     Rate and Rhythm: Normal rate and regular rhythm.     Heart sounds: No murmur heard. Pulmonary:     Effort: Pulmonary effort is normal. No respiratory distress.     Breath sounds: Normal breath sounds.  Abdominal:     Palpations: Abdomen is soft.     Tenderness: There is no abdominal tenderness.  Musculoskeletal:        General: No swelling.     Cervical back: Neck supple.  Skin:    General: Skin is warm and dry.     Capillary Refill: Capillary refill takes less than 2 seconds.  Neurological:     Mental Status: She is alert.  Psychiatric:        Mood and Affect: Mood normal.     ED Results and Treatments Labs (all labs ordered are listed, but only abnormal results are displayed) Labs Reviewed  COMPREHENSIVE METABOLIC PANEL - Abnormal; Notable for the following components:      Result Value   Chloride 96 (*)    Glucose, Bld 104 (*)    Creatinine, Ser 5.74 (*)    GFR, Estimated 9 (*)    All other components within normal limits  CBC WITH DIFFERENTIAL/PLATELET - Abnormal; Notable for the following components:   RBC 3.36 (*)    Hemoglobin 9.6 (*)    HCT 27.7 (*)    All other components within normal limits  LACTIC ACID, PLASMA - Abnormal; Notable for the following components:   Lactic Acid, Venous 2.1 (*)    All other components within normal limits  LACTIC ACID, PLASMA                                                                                                                          Radiology No results found.  Pertinent labs & imaging results that were available during my care of the patient were reviewed by me and  considered in my medical decision making (see MDM for details).  Medications Ordered in ED Medications  rOPINIRole (REQUIP) tablet 3 mg (3 mg Oral Given 03/06/22 0306)  pregabalin (LYRICA) capsule 150 mg (150 mg Oral Given 03/06/22 0256)  lactated ringers infusion ( Intravenous New Bag/Given 03/06/22 0529)  acetaminophen (TYLENOL) tablet 650 mg (has no administration in time range)    Or  acetaminophen (TYLENOL) suppository 650 mg (has no administration in time range)  lactated ringers bolus 1,000 mL (0 mLs Intravenous Stopped 03/06/22 0540)  fentaNYL (SUBLIMAZE) injection 50 mcg (50 mcg Intravenous Given 03/06/22 0340)  albumin human 25 % solution 25 g (0 g Intravenous Stopped 03/06/22 0540)  PHENYLephrine 80 mcg/ml in normal saline Adult IV Push Syringe (For Blood Pressure Support) (160 mcg Intravenous Given 03/06/22 0351)  lactated ringers bolus 1,000 mL (1,000 mLs Intravenous New  Bag/Given 03/06/22 0529)                                                                                                                                     Procedures .Critical Care  Performed by: Teressa Lower, MD Authorized by: Teressa Lower, MD   Critical care provider statement:    Critical care time (minutes):  30   Critical care was necessary to treat or prevent imminent or life-threatening deterioration of the following conditions:  Circulatory failure   Critical care was time spent personally by me on the following activities:  Development of treatment plan with patient or surrogate, discussions with consultants, evaluation of patient's response to treatment, examination of patient, ordering and review of laboratory studies, ordering and review of radiographic studies, ordering and performing treatments and interventions, pulse oximetry, re-evaluation of patient's condition and review of old charts   (including critical care time)  Medical Decision Making / ED Course   This patient presents  to the ED for concern of hypotension, leg pain, this involves an extensive number of treatment options, and is a complaint that carries with it a high risk of complications and morbidity.  The differential diagnosis includes hypovolemic shock, septic shock, obstructive shock, hypoalbuminemia, vasoplegia, dehydration  MDM: Patient seen emergency room for evaluation of hypotension.  Physical exam largely unremarkable.  Left upper extremity fistula with good thrill.  Laboratory evaluation with a creatinine of 5.74, lactic acid 2.1 but is otherwise unremarkable.  Patient arrives with blood pressures improved after EMS fluid resuscitation, however, she quickly became hypotensive again with pressures in the 60s requiring aggressive fluid resuscitation and a single push of Neo-Synephrine.  I did perform a bedside ultrasound and visualized the IVC showing signs of hypovolemia and no evidence of cardiac tamponade.  I did attempt to place a radial arterial line without success, but cuff pressures have been accurate.  Patient blood pressure ultimately stabilized with appropriate fluid resuscitation.  I am concerned that the patient is likely pulling too much fluid with her home dialysis and her lower extremity symptoms are from hypovolemia and hypoperfusion.  I spoke with the nephrologist on-call Dr. Melvia Heaps and the patient will likely need a nephrology consultation to discuss changes in her home dialysis regimen.  Patient will require hospital admission for rehydration and observation and patient admitted.   Additional history obtained:  -External records from outside source obtained and reviewed including: Chart review including previous notes, labs, imaging, consultation notes   Lab Tests: -I ordered, reviewed, and interpreted labs.   The pertinent results include:   Labs Reviewed  COMPREHENSIVE METABOLIC PANEL - Abnormal; Notable for the following components:      Result Value   Chloride 96 (*)    Glucose,  Bld 104 (*)    Creatinine, Ser 5.74 (*)    GFR, Estimated 9 (*)    All other components within normal limits  CBC WITH  DIFFERENTIAL/PLATELET - Abnormal; Notable for the following components:   RBC 3.36 (*)    Hemoglobin 9.6 (*)    HCT 27.7 (*)    All other components within normal limits  LACTIC ACID, PLASMA - Abnormal; Notable for the following components:   Lactic Acid, Venous 2.1 (*)    All other components within normal limits  LACTIC ACID, PLASMA       Medicines ordered and prescription drug management: Meds ordered this encounter  Medications   DISCONTD: rOPINIRole (REQUIP) tablet 0.5 mg   DISCONTD: pregabalin (LYRICA) capsule 150 mg   rOPINIRole (REQUIP) tablet 3 mg    Current dose 3 mg qhs per recent RX and Office notes   pregabalin (LYRICA) capsule 150 mg    Current dose 150 mg qhs per recent RX and Office notes   lactated ringers bolus 1,000 mL   fentaNYL (SUBLIMAZE) injection 50 mcg   albumin human 25 % solution 25 g   PHENYLephrine 80 mcg/ml in normal saline Adult IV Push Syringe (For Blood Pressure Support)   phenylephrine 80 mcg/10 mL injection    Demetrius Revel A: cabinet override   lactated ringers bolus 1,000 mL   lactated ringers infusion   OR Linked Order Group    acetaminophen (TYLENOL) tablet 650 mg    acetaminophen (TYLENOL) suppository 650 mg    -I have reviewed the patients home medicines and have made adjustments as needed  Critical interventions Fluid resuscitation, Neo-Synephrine  Consultations Obtained: I requested consultation with the nephrologist Dr. Melvia Heaps,  and discussed lab and imaging findings as well as pertinent plan - they recommend: Inpatient nephrology consultation   Cardiac Monitoring: The patient was maintained on a cardiac monitor.  I personally viewed and interpreted the cardiac monitored which showed an underlying rhythm of: NSR  Social Determinants of Health:  Factors impacting patients care include:  none   Reevaluation: After the interventions noted above, I reevaluated the patient and found that they have :improved  Co morbidities that complicate the patient evaluation  Past Medical History:  Diagnosis Date   Anemia    Chronic kidney disease    Hypertension    Lupus ([place])    Pericardial effusion 04/13/2019   circumferential   Pneumonia 04/12/2019   Pulmonary hypertension ([place]) 04/13/2019   moderate      Dispostion: I considered admission for this patient, and due to severe hypotension and likely hypovolemic shock secondary to pulling too much fluid from home dialysis she will require hospital admission for rehydration     Final Clinical Impression(s) / ED Diagnoses Final diagnoses:  Hypotension, unspecified hypotension type     @PCDICTATION @    Teressa Lower, MD 03/06/22 [phone]

## 2022-03-06 NOTE — Progress Notes (Signed)
  Carryover admission to the Day Admitter.  I discussed this case with the EDP, Dr. Matilde Sprang.  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  In terms of hemodialysis, she reports that she self cannulates 4 days a week, and that recently she has been developing dizziness, presyncope with these home dialysis sessions, before experiencing a episode of syncope with prodrome prompting her to present to [facility] emergency department via EMS for further evaluation.  Upon arrival in the emergency department, initial systolic blood pressures in the 60s, subsequently improving into the low 100s following 1.5 L of IVF plus albumin.  Is maintaining similar blood pressures now on continuous LR at 125 cc/h.  EDP performed bedside ultrasound, showing inspiratory collapse of IVC.  EDP discussed patient's case with the on-call nephrologist, Dr. Jonnie Finner, who recommended admission to the hospitalist service, with preliminary additional recommendations for continued IV fluid resuscitation, and conveyed that nephrology will formally consult in the morning.   I have placed an order for  obs to pcu for further evaluation management of the above.  I have placed some additional preliminary admit orders via the adult multi-morbid admission order set. I have also continued the existing order for continuous LR at 125 cc/h.    Babs Bertin, DO Hospitalist

## 2022-03-06 NOTE — Progress Notes (Signed)
PHARMACIST - PHYSICIAN COMMUNICATION  CONCERNING:  Enoxaparin (Lovenox) for DVT Prophylaxis    RECOMMENDATION: Patient was prescribed enoxaprin 40mg  q24 hours for VTE prophylaxis.   Filed Weights   03/06/22 0528  Weight: 83 kg (182 lb 15.7 oz)    Body mass index is 32.41 kg/m.  Estimated Creatinine Clearance: 12.9 mL/min (A) (by C-G formula based on SCr of 5.74 mg/dL (H)).   Patient is candidate for enoxaparin 30mg  every 24 hours based on CrCl <32ml/min   DESCRIPTION: Pharmacy has adjusted enoxaparin dose per [facility] policy.  Patient is now receiving enoxaparin 30 mg every 24 hours    Berta Minor, PharmD Clinical Pharmacist  03/06/2022 9:43 AM

## 2022-03-06 NOTE — Progress Notes (Signed)
Asked to see patient about syncope episode during HD this weekend. Pt seen in ED. Pt has been on HD for 2 yrs and home HD for 2 months. She does HD Sun -mon- thurs- Friday, about 3.5 h each treatment. Her typical wt gains are 3kg. Her dry wt is 71.5kg and hasn't been changed since she started on home HD.  After she went onto home HD, her BP's improved/ got better and she has been taken off all of her home BP lowering medications. She is eating "very good". She used to drink "way too much" out of anxiety and one of the main stressors seems to have been going to the OP HD unit. She says she is some much less anxious doing HD at home. On exam there is no edema, lungs clear. Labs are all good for esrd pt. She likely has gained body wt and needs to let her dry wt come up.  I suggested that w/ her next HD session, she not pull anything and let her wt's come up to 73kg, or 74kg or 75kg. She will need to find a new weight that is not causing hypotension as much as she is experiencing now, and she can work w/ Dr Hollie Salk and the home HD staff with figuring this out.  She is asymptomatic now and can be dc'd home.    Kelly Splinter, MD 03/06/2022, 1:38 PM  Recent Labs  Lab 03/06/22 0300 03/06/22 1001  HGB 9.6* 7.9*  ALBUMIN 3.5  --   CALCIUM 8.9  --   CREATININE 5.74*  --   K 3.8  --

## 2022-03-06 NOTE — ED Triage Notes (Signed)
Patient arrives from home via ems due to feeling dizzy and bp  reading low after her hemo-dialysis tx was completed.  Per ems initial bp was reading 62/36. Administered 900 cc fluids prior arrival bp rebounded to 96/53 hr 106

## 2022-03-06 NOTE — H&P (Addendum)
History and Physical    Patient: Latasha Drake FMB:[id number] DOB: [birth year] DOA: 03/06/2022 DOS: the patient was seen and examined on 03/06/2022 PCP: Katherina Mires, MD  Patient coming from: Home  Chief Complaint:  Chief Complaint  Patient presents with   Hypotension   HPI: Latasha Drake is a 41 y.o. 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  She was sitting at the time and felt dizzy prior to passing out.  Denied falling or hitting her head.  She had not been on any blood pressure medications since starting hemodialysis.  Patient denied having any fever, chest pain, nausea, vomiting, diarrhea, or recent sick contacts to her knowledge. Over the last 2 months she has been in training for home hemodialysis and admits that she had been having issues with taking off too much fluid.  She states that currently her dry weight was set at 71.5 kg and with the holidays she thinks she may have overdid it.  She has been dialyzing at home on Monday, Tuesday, Thursday, Friday schedule.  EMS noted patient's initial blood pressure be 62/36.  Patient was given 900 cc with improvement up to 96/53 with heart rate 106.   In the emergency department patient was noted to be afebrile with pulse elevated up to 115, respirations 16-24, blood pressures initially as low as 70/48 with improvement after IV fluid bolus with albumin.  Labs noted hemoglobin 9.6, potassium 3.8, BUN 20, creatinine 5.74, and lactic acid 2.1->0.8.  Bedside ultrasound had revealed inspiratory collapse of the IVC. Patient had been given fentanyl 50 mcg IV, 2 L bolus of IV fluids, albumin 25 g, and had initially been ordered phenyepinephrine IV temporarily.  Nephrology had been formally consulted.    Review of Systems: As mentioned in the history of present illness. All other systems reviewed and  are negative. Past Medical History:  Diagnosis Date   Anemia    Chronic kidney disease    Hypertension    Lupus ([place])    Pericardial effusion 04/13/2019   circumferential   Pneumonia 04/12/2019   Pulmonary hypertension ([place]) 04/13/2019   moderate   Past Surgical History:  Procedure Laterality Date   CESAREAN SECTION     CORONARY/GRAFT ACUTE MI REVASCULARIZATION N/A 03/14/2021   Procedure: Coronary/Graft Acute MI Revascularization;  Surgeon: Troy Sine, MD;  Location: [facility];  Service: Cardiovascular;  Laterality: N/A;   HERNIA REPAIR     IR FLUORO GUIDE CV LINE RIGHT  04/19/2019   IR FLUORO GUIDE CV LINE RIGHT  04/22/2019   IR US GUIDE VASC ACCESS RIGHT  04/19/2019   IR US GUIDE VASC ACCESS RIGHT  04/22/2019   LEFT HEART CATH AND CORONARY ANGIOGRAPHY N/A 03/14/2021   Procedure: LEFT HEART CATH AND CORONARY ANGIOGRAPHY;  Surgeon: Troy Sine, MD;  Location: [facility];  Service: Cardiovascular;  Laterality: N/A;   Social History:  reports that she has been smoking cigarettes. She has been smoking an average of .5 packs per day. She has never used smokeless tobacco. She reports current alcohol use. She reports that she does not use drugs.  Allergies  Allergen Reactions   Baclofen Other (See Comments)    Tremors   Lisinopril Swelling    Lips swelled    Family History  Problem Relation Age of Onset   Lupus Other        cousin  Prior to Admission medications   Medication Sig Start Date End Date Taking? Authorizing Provider  amLODipine (NORVASC) 10 MG tablet Take one tablet (10 mg) by mouth in the morning on non-dialysis days - Sunday, Monday, Wednesday, Friday; take one tablet (10 mg) daily at bedtime Patient taking differently: Take 10 mg by mouth daily. Take one tablet (10 mg) by mouth in the morning on non-dialysis days - Sunday, Monday, Wednesday, Friday; take one tablet (10 mg) daily at bedtime 03/15/21   Bhagat, Crista Luria, PA  aspirin EC 81 MG  tablet Take 1 tablet (81 mg total) by mouth daily. Swallow whole. 03/15/21   Bhagat, Crista Luria, PA  atorvastatin (LIPITOR) 40 MG tablet Take 1 tablet (40 mg total) by mouth daily. 03/16/21   Bhagat, Crista Luria, PA  clotrimazole (LOTRIMIN) 1 % external solution 1 application. See admin instructions. Instill 5 drops into right ear twice daily until healed PRN 03/10/21   [provider]  cyclobenzaprine (FLEXERIL) 10 MG tablet Take 10 mg by mouth 3 (three) times daily. 04/03/19   [provider]  labetalol (NORMODYNE) 300 MG tablet Take 300 mg by mouth 2 (two) times daily. 04/05/21   [provider]  pantoprazole (PROTONIX) 40 MG tablet Take 40 mg by mouth daily. 10/27/16   [provider]  paragard intrauterine copper IUD IUD 1 Intra Uterine Device by Intrauterine route once.    [provider]  pregabalin (LYRICA) 75 MG capsule Take 150 mg by mouth 2 (two) times daily. Prescribed as 2 capsules twice daily and 1 capsule at bedtime. Pt endorses taking all 5 capsules at one time everyday. 03/08/21   [provider]  pregabalin (LYRICA) 75 MG capsule Take 75 mg by mouth at bedtime. Prescribed as 2 capsules twice daily and 1 capsule at bedtime. Pt endorses taking all 5 capsules at one time everyday.    [provider]  rOPINIRole (REQUIP) 0.5 MG tablet Take 0.5 mg by mouth 2 (two) times daily. 02/01/21   [provider]  sertraline (ZOLOFT) 50 MG tablet Take 50 mg by mouth daily. 04/20/21   [provider]  sevelamer carbonate (RENVELA) 800 MG tablet Take 1,600 mg by mouth 2 (two) times daily with a meal. 08/26/20   [provider]  varenicline (CHANTIX) 0.5 MG tablet Take 0.5 mg by mouth daily. 04/20/21   [provider]    Physical Exam: Vitals:   03/06/22 0528 03/06/22 0545 03/06/22 0730 03/06/22 0830  BP: 100/60 106/64 (!) 114/55 132/73  Pulse: 88 80 72 92  Resp: 19 19 19 20   Temp: 98.4 F (36.9 C)  97.9 F  (36.6 C)   TempSrc: Oral  Oral   SpO2:  98% 99% 94%  Weight: 83 kg     Height: 5\' 3"  (1.6 m)       Constitutional: NAD, calm, comfortable Eyes: PERRL, lids and conjunctivae normal ENMT: Mucous membranes are moist.   Neck: normal, supple, no JVD. Respiratory: clear to auscultation bilaterally, no wheezing, no crackles. Normal respiratory effort. No accessory muscle use.  Cardiovascular: Regular rate and rhythm, no murmurs / rubs / gallops.  Left upper extremity fistula in place with follow-through. Abdomen: no tenderness, no masses palpated.  Bowel sounds positive.  Musculoskeletal: no clubbing / cyanosis. No joint deformity upper and lower extremities. Good ROM, no contractures. Normal muscle tone.  Skin: no rashes, lesions, ulcers. No induration Neurologic: CN 2-12 grossly intact.  Strength 5/5 in all 4.  Psychiatric: Normal judgment and insight. Alert  and oriented x 3. Normal mood.   Data Reviewed:  Reviewed labs, imaging and pertinent records as noted above HPI  Assessment and Plan: Syncope secondary to hypotension Patient presents after having episode of syncope after dialysis on 12/30.  Blood pressures initially reported to be as low as 70/48.  Bedside ultrasound noted respiratory collapse of the IVC for which patient was thought to be hypovolemic.  Patient has been given at least 2 to 3 L of IV fluids and 25 g of albumin with improvement in pressures.  Patient has been taken off of all blood pressure medications after starting hemodialysis. -Admit to a progressive bed -Goal MAP greater than 65 -Adjust IV fluids fluids as needed  ESRD on hemodialysis Lupus nephritis Patient with history of end-stage renal disease secondary to lupus nephritis.  Currently on home hemodialysis schedule of Monday, Tuesday, Thursday, and Friday.  Patient had dry weight at 71.5 kg, but after discussions with Dr. Jonnie Finner of nephrology recommended increasing patient's dry weight to 73.5 kg -Recommend  monitoring weight daily -Continue current medication regimen -Continue outpatient follow-up with nephrology  Lactic acidosis Resolved.  Initial lactic acid elevated 2.1, but repeat check within normal limits after fluid resuscitation.  Anemia of chronic kidney disease Acute on chronic.  Hemoglobin 9.6, but previously had been 10 -11 g/dL in March of this year. -Type and screen -Recheck H&H   DVT prophylaxis: Lovenox scheduled for this evening as long as H&H appears stable Advance Care Planning:   Code Status: Full Code    Consults: Nephrology  Family Communication: None requested  Severity of Illness: The appropriate patient status for this patient is OBSERVATION. Observation status is judged to be reasonable and necessary in order to provide the required intensity of service to ensure the patient's safety. The patient's presenting symptoms, physical exam findings, and initial radiographic and laboratory data in the context of their medical condition is felt to place them at decreased risk for further clinical deterioration. Furthermore, it is anticipated that the patient will be medically stable for discharge from the hospital within 2 midnights of admission.   Author: Norval Morton, MD 03/06/2022 9:12 AM  For on call review [email or web address].

## 2022-05-19 ENCOUNTER — Ambulatory Visit
Admission: EM | Admit: 2022-05-19 | Discharge: 2022-05-19 | Disposition: A | Discharge status: Home / Self Care | Payer: 59

## 2022-05-19 DIAGNOSIS — I10 Essential (primary) hypertension: Secondary | ICD-10-CM

## 2022-05-19 DIAGNOSIS — R112 Nausea with vomiting, unspecified: Secondary | ICD-10-CM

## 2022-05-19 DIAGNOSIS — A084 Viral intestinal infection, unspecified: Secondary | ICD-10-CM

## 2022-05-19 MED ORDER — ONDANSETRON 4 MG PO TBDP
4.0000 mg | ORAL_TABLET | Freq: Once | ORAL | Status: AC
  Administered 2022-05-19: 4 mg via ORAL

## 2022-05-19 MED ORDER — ONDANSETRON 4 MG PO TBDP: 4.0000 mg | ORAL_TABLET | Freq: Three times a day (TID) | ORAL | 0 refills | Status: DC | PRN

## 2022-05-19 NOTE — ED Provider Notes (Signed)
[facility]    CSN: [id number] Arrival date & time: 05/19/22  1908      History   Chief Complaint Chief Complaint  Patient presents with   Diarrhea   Vomiting    HPI Latasha Drake is a 41 y.o. female presents for evaluation of nausea/vomiting/diarrhea.  Patient reports 3 days of nonbloody vomiting and diarrhea.  Denies any fevers, URI symptoms, body aches.  Denies history of GERD, IBS, Crohn's.  She is on hemodialysis and did miss a whole week but did have a treatment today and is also scheduled for a treatment tomorrow.  She does make some urine.  No dysuria.  Her blood pressure was noted to be elevated on intake.  She states she is frustrated about her dialysis situation but has been taking her medications as prescribed, however, she did not take her BP meds today due to dialysis..  No recent travel.  She does have a prescription for Zofran but ran out.  She did take Pepto-Bismol OTC without change in her symptoms.  No other concerns at this time.   Diarrhea Associated symptoms: vomiting     Past Medical History:  Diagnosis Date   Anemia    Chronic kidney disease    Hypertension    Hypotension 03/06/2022   Lupus ([place])    Pericardial effusion 04/13/2019   circumferential   Pneumonia 04/12/2019   Pulmonary hypertension ([place]) 04/13/2019   moderate    Patient Active Problem List   Diagnosis Date Noted   Syncope 03/06/2022   Lactic acidosis 03/06/2022   History of anemia due to chronic kidney disease 03/06/2022   AMS (altered mental status) 05/19/2021   STEMI (ST elevation myocardial infarction) ([place]) 03/14/2021   Weakness 03/14/2021   Prediabetes 03/14/2021   Acute pulmonary edema ([facility])    Acute hypoxemic respiratory failure ([place]) 12/05/2020   Iron deficiency anemia, unspecified 09/15/2020   Pleural effusion    Acute respiratory failure with hypoxia ([place]) 04/19/2019   Chest pain 04/13/2019   ESRD on dialysis ([place]) 04/13/2019   Community acquired pneumonia  04/12/2019   MDD (major depressive disorder) 10/26/2018   Acute on chronic blood loss anemia 10/04/2018   Symptomatic anemia 10/04/2018   Lupus ([place])    Hypertension    Elevated d-dimer    Pericardial effusion    Traumatic perinephric hematoma of left kidney     Past Surgical History:  Procedure Laterality Date   CESAREAN SECTION     CORONARY/GRAFT ACUTE MI REVASCULARIZATION N/A 03/14/2021   Procedure: Coronary/Graft Acute MI Revascularization;  Surgeon: Troy Sine, MD;  Location: [facility];  Service: Cardiovascular;  Laterality: N/A;   HERNIA REPAIR     IR FLUORO GUIDE CV LINE RIGHT  04/19/2019   IR FLUORO GUIDE CV LINE RIGHT  04/22/2019   IR US GUIDE VASC ACCESS RIGHT  04/19/2019   IR US GUIDE VASC ACCESS RIGHT  04/22/2019   LEFT HEART CATH AND CORONARY ANGIOGRAPHY N/A 03/14/2021   Procedure: LEFT HEART CATH AND CORONARY ANGIOGRAPHY;  Surgeon: Troy Sine, MD;  Location: [facility];  Service: Cardiovascular;  Laterality: N/A;    OB History   No obstetric history on file.      Home Medications    Prior to Admission medications   Medication Sig Start Date End Date Taking? Authorizing Provider  labetalol (NORMODYNE) 300 MG tablet  07/20/20  Yes [provider]  ondansetron (ZOFRAN-ODT) 4 MG disintegrating tablet Take 1 tablet (4 mg  total) by mouth every 8 (eight) hours as needed for nausea or vomiting. 05/19/22  Yes Melynda Ripple, NP  busPIRone (BUSPAR) 5 MG tablet Take 5 mg by mouth 3 (three) times daily as needed (for anxiety).    [provider]  cyclobenzaprine (FLEXERIL) 10 MG tablet Take 10 mg by mouth 3 (three) times daily as needed for muscle spasms. 04/03/19   [provider]  diazepam (VALIUM) 2 MG tablet Take 2 mg by mouth See admin instructions. Take 2 mg by mouth as needed for scans    [provider]  pantoprazole (PROTONIX) 40 MG tablet Take 40 mg by mouth daily before breakfast. 10/27/16   [provider]  paragard intrauterine copper IUD IUD 1 Intra Uterine Device by Intrauterine route once.    [provider]  pregabalin (LYRICA) 150 MG capsule Take 150 mg by mouth at bedtime.    [provider]  rOPINIRole (REQUIP) 3 MG tablet Take 3 mg by mouth at bedtime.    [provider]  sertraline (ZOLOFT) 50 MG tablet Take 50 mg by mouth at bedtime. 04/20/21   [provider]  sevelamer carbonate (RENVELA) 800 MG tablet Take 2,400 mg by mouth 3 (three) times daily with meals. 08/26/20   [provider]  sodium zirconium cyclosilicate (LOKELMA) 10 g PACK packet Take 10 g by mouth daily.    [provider]    Family History Family History  Problem Relation Age of Onset   Lupus Other        cousin    Social History Social History   Tobacco Use   Smoking status: Every Day    Packs/day: .5    Types: Cigarettes   Smokeless tobacco: Never  Substance Use Topics   Alcohol use: Yes    Comment: occassionally   Drug use: No     Allergies   Lisinopril and Baclofen   Review of Systems Review of Systems  Gastrointestinal:  Positive for diarrhea, nausea and vomiting.     Physical Exam Triage Vital Signs ED Triage Vitals  Enc Vitals Group     BP 05/19/22 2002 (!) 185/113     Pulse Rate 05/19/22 2002 86     Resp 05/19/22 2002 18     Temp 05/19/22 2002 99.1 F (37.3 C)     Temp Source 05/19/22 2001 Oral     SpO2 05/19/22 2002 96 %     Weight --      Height --      Head Circumference --      Peak Flow --      Pain Score 05/19/22 1959 5     Pain Loc --      Pain Edu? --      Excl. in [place]? --    No data found.  Updated Vital Signs BP (!) 180/119 (BP Location: Right Arm) Comment: provider made aware  Pulse 86   Temp 99.1 F (37.3 C) (Oral)   Resp 18   LMP 04/20/2022 (Approximate)   SpO2 96%   Visual Acuity Right Eye Distance:   Left Eye Distance:   Bilateral Distance:    Right Eye Near:   Left Eye Near:     Bilateral Near:     Physical Exam Vitals and nursing note reviewed.  Constitutional:      Appearance: Normal appearance.  HENT:     Head: Normocephalic and atraumatic.  Eyes:     Pupils: Pupils are equal, round,  and reactive to light.  Cardiovascular:     Rate and Rhythm: Normal rate.  Pulmonary:     Effort: Pulmonary effort is normal.     Breath sounds: Normal breath sounds. No wheezing, rhonchi or rales.  Abdominal:     General: Bowel sounds are normal. There is no distension.     Palpations: Abdomen is soft.     Tenderness: There is no abdominal tenderness. There is no right CVA tenderness, left CVA tenderness or guarding.  Skin:    General: Skin is warm and dry.  Neurological:     General: No focal deficit present.     Mental Status: She is alert and oriented to person, place, and time.  Psychiatric:        Mood and Affect: Mood normal.        Behavior: Behavior normal.      UC Treatments / Results  Labs (all labs ordered are listed, but only abnormal results are displayed) Labs Reviewed - No data to display  EKG   Radiology No results found.  Procedures Procedures (including critical care time)  Medications Ordered in UC Medications  ondansetron (ZOFRAN-ODT) disintegrating tablet 4 mg (4 mg Oral Given 05/19/22 2014)    Initial Impression / Assessment and Plan / UC Course  I have reviewed the triage vital signs and the nursing notes.  Pertinent labs & imaging results that were available during my care of the patient were reviewed by me and considered in my medical decision making (see chart for details).     Reviewed exam and symptoms with patient.  No red flags. Discussed viral enteritis.  Patient given Zofran in clinic and Rx sent to pharmacy Brat diet and encouraged fluids Discussed blood pressure with patient.  She states it is elevated due to her frustration and and she has not taken her BP meds today.  Patient will take BP meds when she gets home  she reports.  She does go to dialysis tomorrow and currently denies any headache, chest pain, shortness of breath, or dizziness. Advised PCP follow-up in 2 days for recheck Strict ER precautions reviewed and patient verbalized understanding Final Clinical Impressions(s) / UC Diagnoses   Final diagnoses:  Nausea and vomiting, unspecified vomiting type  Viral enteritis  Hypertension, unspecified type     Discharge Instructions      Zofran as needed for nausea/vomiting Rest and try to remain hydrated Please go to the emergency room if you have any worsening symptoms Follow-up with your PCP if your symptoms do not improve    ED Prescriptions     Medication Sig Dispense Auth. Provider   ondansetron (ZOFRAN-ODT) 4 MG disintegrating tablet Take 1 tablet (4 mg total) by mouth every 8 (eight) hours as needed for nausea or vomiting. 15 tablet Melynda Ripple, NP      PDMP not reviewed this encounter.   Melynda Ripple, NP 05/19/22 2018

## 2022-05-19 NOTE — ED Triage Notes (Signed)
Pt c/o episodes of vomiting and diarrhea since Tuesday.  Home interventions: pepto bismol

## 2022-05-19 NOTE — Discharge Instructions (Signed)
Zofran as needed for nausea/vomiting Rest and try to remain hydrated Please go to the emergency room if you have any worsening symptoms Follow-up with your PCP if your symptoms do not improve

## 2022-08-28 ENCOUNTER — Emergency Department (HOSPITAL_COMMUNITY)
Admission: EM | Admit: 2022-08-28 | Discharge: 2022-08-28 | Disposition: A | Discharge status: Home / Self Care | Payer: 59 | Attending: Emergency Medicine | Admitting: Emergency Medicine

## 2022-08-28 ENCOUNTER — Emergency Department (HOSPITAL_COMMUNITY): Payer: 59

## 2022-08-28 ENCOUNTER — Other Ambulatory Visit: Payer: Self-pay

## 2022-08-28 ENCOUNTER — Encounter (HOSPITAL_COMMUNITY): Payer: Self-pay

## 2022-08-28 DIAGNOSIS — I120 Hypertensive chronic kidney disease with stage 5 chronic kidney disease or end stage renal disease: Secondary | ICD-10-CM | POA: Diagnosis not present

## 2022-08-28 DIAGNOSIS — N186 End stage renal disease: Secondary | ICD-10-CM | POA: Diagnosis not present

## 2022-08-28 DIAGNOSIS — R591 Generalized enlarged lymph nodes: Secondary | ICD-10-CM

## 2022-08-28 DIAGNOSIS — Z992 Dependence on renal dialysis: Secondary | ICD-10-CM | POA: Insufficient documentation

## 2022-08-28 DIAGNOSIS — R079 Chest pain, unspecified: Secondary | ICD-10-CM

## 2022-08-28 DIAGNOSIS — R590 Localized enlarged lymph nodes: Secondary | ICD-10-CM | POA: Diagnosis not present

## 2022-08-28 DIAGNOSIS — E875 Hyperkalemia: Secondary | ICD-10-CM | POA: Diagnosis not present

## 2022-08-28 DIAGNOSIS — R0789 Other chest pain: Secondary | ICD-10-CM | POA: Diagnosis present

## 2022-08-28 DIAGNOSIS — E1122 Type 2 diabetes mellitus with diabetic chronic kidney disease: Secondary | ICD-10-CM | POA: Insufficient documentation

## 2022-08-28 DIAGNOSIS — Z79899 Other long term (current) drug therapy: Secondary | ICD-10-CM | POA: Diagnosis not present

## 2022-08-28 DIAGNOSIS — J9859 Other diseases of mediastinum, not elsewhere classified: Secondary | ICD-10-CM | POA: Diagnosis not present

## 2022-08-28 LAB — BASIC METABOLIC PANEL
Anion gap: 18 — ABNORMAL HIGH (ref 5–15)
BUN: 41 mg/dL — ABNORMAL HIGH (ref 6–20)
CO2: 28 mmol/L (ref 22–32)
Calcium: 9.8 mg/dL (ref 8.9–10.3)
Chloride: 91 mmol/L — ABNORMAL LOW (ref 98–111)
Creatinine, Ser: 7.76 mg/dL — ABNORMAL HIGH (ref 0.44–1.00)
GFR, Estimated: 6 mL/min — ABNORMAL LOW (ref >60)
Glucose, Bld: 120 mg/dL — ABNORMAL HIGH (ref 70–99)
Potassium: 5.3 mmol/L — ABNORMAL HIGH (ref 3.5–5.1)
Sodium: 137 mmol/L (ref 135–145)

## 2022-08-28 LAB — CBC
HCT: 36.6 % (ref 36.0–46.0)
Hemoglobin: 12.1 g/dL (ref 12.0–15.0)
MCH: 26.4 pg (ref 26.0–34.0)
MCHC: 33.1 g/dL (ref 30.0–36.0)
MCV: 79.9 fL — ABNORMAL LOW (ref 80.0–100.0)
Platelets: 240 10*3/uL (ref 150–400)
RBC: 4.58 MIL/uL (ref 3.87–5.11)
RDW: 17.1 % — ABNORMAL HIGH (ref 11.5–15.5)
WBC: 6.1 10*3/uL (ref 4.0–10.5)
nRBC: 0 % (ref 0.0–0.2)

## 2022-08-28 LAB — TROPONIN I (HIGH SENSITIVITY)
Troponin I (High Sensitivity): 17 ng/L (ref <18)
Troponin I (High Sensitivity): 16 ng/L (ref <18)

## 2022-08-28 LAB — I-STAT BETA HCG BLOOD, ED (MC, WL, AP ONLY): I-stat hCG, quantitative: <5 m[IU]/mL (ref <5)

## 2022-08-28 MED ORDER — IOHEXOL 350 MG/ML SOLN
75.0000 mL | Freq: Once | INTRAVENOUS | Status: AC | PRN
  Administered 2022-08-28: 75 mL via INTRAVENOUS

## 2022-08-28 MED ORDER — CYCLOBENZAPRINE HCL 10 MG PO TABS: 10.0000 mg | ORAL_TABLET | Freq: Two times a day (BID) | ORAL | 0 refills | Status: DC | PRN

## 2022-08-28 NOTE — Discharge Instructions (Addendum)
Please do not eat any potassium Take your [facility] as previously prescribed tonight Make sure you go to dialysis  tomorrow You have a noted and an undefined mass on your CT scan.  It was present on prior.  You need to follow-up with your doctor this week and make sure that outpatient workup and probable further imaging is done

## 2022-08-28 NOTE — ED Provider Notes (Signed)
[place] EMERGENCY DEPARTMENT AT [facility] Provider Note   CSN: [id number] Arrival date & time: 08/28/22  1106     History  Chief Complaint  Patient presents with   Chest Pain    Latasha Drake is a 42 y.o. female.  HPI 42 year old female history of end-stage renal disease on dialysis Monday Wednesday Friday, history of hypertension, lupus, pericardial effusion, acute respiratory failure, STEMI, diabetes, presents today complaining of left-sided chest pain that began fairly abruptly yesterday.  It is painful with movement and deep breathing.  She feels that her shortness of breath is due to pain.  She did have similar symptoms a year ago and states that she had fluid in her lung at that time.  She reports at that time she had missed her dialysis.  She has been to dialysis every day this week but states that she had missed on the week before and is still catching up with her fluid.  She states her current weight is 72 kg at her target is 68 kg.  She denies any fever, chills, nausea, vomiting, diarrhea, similar pain to prior MI     Home Medications Prior to Admission medications   Medication Sig Start Date End Date Taking? Authorizing Provider  busPIRone (BUSPAR) 5 MG tablet Take 5 mg by mouth 3 (three) times daily as needed (for anxiety).    [provider]  cyclobenzaprine (FLEXERIL) 10 MG tablet Take 10 mg by mouth 3 (three) times daily as needed for muscle spasms. 04/03/19   [provider]  diazepam (VALIUM) 2 MG tablet Take 2 mg by mouth See admin instructions. Take 2 mg by mouth as needed for scans    [provider]  labetalol (NORMODYNE) 300 MG tablet  07/20/20   [provider]  ondansetron (ZOFRAN-ODT) 4 MG disintegrating tablet Take 1 tablet (4 mg total) by mouth every 8 (eight) hours as needed for nausea or vomiting. 05/19/22   Radford Pax, NP  pantoprazole (PROTONIX) 40 MG tablet Take 40 mg by mouth daily before breakfast.  10/27/16   [provider]  paragard intrauterine copper IUD IUD 1 Intra Uterine Device by Intrauterine route once.    [provider]  pregabalin (LYRICA) 150 MG capsule Take 150 mg by mouth at bedtime.    [provider]  rOPINIRole (REQUIP) 3 MG tablet Take 3 mg by mouth at bedtime.    [provider]  sertraline (ZOLOFT) 50 MG tablet Take 50 mg by mouth at bedtime. 04/20/21   [provider]  sevelamer carbonate (RENVELA) 800 MG tablet Take 2,400 mg by mouth 3 (three) times daily with meals. 08/26/20   [provider]  sodium zirconium cyclosilicate (LOKELMA) 10 g PACK packet Take 10 g by mouth daily.    [provider]      Allergies    Lisinopril and Baclofen    Review of Systems   Review of Systems  Physical Exam Updated Vital Signs BP (!) 158/110 (BP Location: Right Arm)   Pulse 100   Temp 98.9 F (37.2 C)   Resp 18   SpO2 96%  Physical Exam Vitals and nursing note reviewed.  Constitutional:      General: She is not in acute distress.    Appearance: She is well-developed. She is not ill-appearing.  HENT:     Head: Normocephalic.  Eyes:     Pupils: Pupils are equal, round, and reactive to light.  Cardiovascular:  Rate and Rhythm: Normal rate and regular rhythm.     Heart sounds: Normal heart sounds.  Pulmonary:     Effort: Pulmonary effort is normal. No tachypnea.     Breath sounds: Normal breath sounds.     Comments: Patient has pain with flexion of the trunk and sitting up. She has some tenderness to palpation of left anterior chest There is no sign of trauma No crepitus No hematoma or palpable masses noted Abdominal:     General: Bowel sounds are normal.     Palpations: Abdomen is soft.  Musculoskeletal:        General: Normal range of motion.     Cervical back: Normal range of motion.     Right lower leg: No edema.     Comments: Dialysis access left upper arm  Skin:    General: Skin is warm.      Capillary Refill: Capillary refill takes less than 2 seconds.  Neurological:     General: No focal deficit present.     Mental Status: She is alert.    ED Results / Procedures / Treatments   Labs (all labs ordered are listed, but only abnormal results are displayed) Labs Reviewed  BASIC METABOLIC PANEL  CBC  I-STAT BETA HCG BLOOD, ED ([facility], [facility], [facility] ONLY)  TROPONIN I (HIGH SENSITIVITY)    EKG EKG Interpretation  Date/Time:  Sunday August 28 2022 11:14:42 EDT Ventricular Rate:  96 PR Interval:  132 QRS Duration: 84 QT Interval:  356 QTC Calculation: 449 R Axis:   51 Text Interpretation: Normal sinus rhythm Normal ECG When compared with ECG of 06-Mar-2022 09:47, PREVIOUS ECG IS PRESENT Non-specific ST-t changes inferior and lateral leads unchanged from first prior ekg of 06 March 2022 Confirmed by Margarita Grizzle [phone]) on 08/28/2022 11:21:18 AM  Radiology No results found.  Procedures Procedures    Medications Ordered in ED Medications - No data to display  ED Course/ Medical Decision Making/ A&P Clinical Course as of 08/28/22 1441  Sun Aug 28, 2022  1232 Chest x-Kalid Ghan reviewed and interpreted no evidence acute abnormality noted radiologist interpretation concurs [DR]  1327 Troponin I (High Sensitivity) [DR]  1440 CT chest negative for pulmonary embolism, newly enlarged left axillary lymph node Prominent soft tissue anterior mediastinum similar to prior exam Cardiomegaly and coronary artery disease This is a continuation airspaces system just to the small airway disease [DR]  1441 CBC reviewed interpreted and within normal limits Basic metabolic panel reviewed interpreted significant for mild hyperkalemia 5.3 creatinine 7.76 consistent with patient's renal failure First troponin is normal at 17 Patient is not pregnant [DR]    Clinical Course User Index [DR] Margarita Grizzle, MD                             Medical Decision Making Amount and/or Complexity of Data  Reviewed Labs: ordered. Decision-making details documented in ED Course. Radiology: ordered.  Risk Prescription drug management.   42 year old female presents today complaining of left-sided chest pain.  On her exam it seems to have a significant musculoskeletal component.  Patient was evaluated with EKG, labs, CT.  Patient has history of coronary artery disease and is end-stage renal disease on dialysis. CT of the chest does not show evidence of PE.  Does have some mosaic attenuation of the airspaces with patient has no symptoms consistent with pneumonia or problems oxygenating or ventilating. She does have a newly enlarged left axillary  lymph node and prominent soft tissue in the mediastinum.  I have discussed these findings with the patient and she is advised regarding need for close follow-up Patient seen and evaluated for chest pain.  Differential diagnosis of serious/life threatening causes of chest pain includes ACS, other diseases of the heart such as myocarditis or pericarditis, lung etiologies such as infection or pneumothorax, diseases of the great vessels such as aortic dissection or AAA, pulmonary embolism, or GI sources such as cholecystitis or other upper abdominal causes. Doubt ACS- heart score documented, EKG reviewed, Given the timing of pain to ER presentation, single troponin 17 so doubt NSTEMI troponin and repeat troponin obtained and WNL Doubt myocarditis/pericarditis/tamponade based on history, review of ekg and labs Doubt aortic dissection based on history and review of imaging Doubt intrinsic lung causes such as pneumonia or pneumothorax, based on history, physical exam, and studies obtained. Doubt PE based on history, physical exam, and PERC Doubt acute GI etiology requiring intervention based on history, physical exam and labs. Patient appears stable for discharge. Return precautions and need for follow up discussed and patient voices understanding  1- chest pain most  c.w. chest wall pain 2- adenopathy and lymph node- will need follow up mri and op work up 3 mild hyperkalemia- patient hemodynamically stable, advised caution on po intake and will dyalize tomorrow        Final Clinical Impression(s) / ED Diagnoses Final diagnoses:  None    Rx / DC Orders ED Discharge Orders     None         Margarita Grizzle, MD 08/28/22 1451

## 2022-08-28 NOTE — ED Triage Notes (Signed)
EMS stated, pt has chest pain, with no other symptoms. The pain just getting worse. Did not miss any of her dialysis treatments this week.

## 2022-10-02 IMAGING — DX DG CHEST 1V PORT
1 series · 1 of 1 positions shown · non-contrast
Comparison: Chest radiographs 04/06/2021.

CLINICAL DATA: 40-year-old female with altered mental status.
Intubated. Missed dialysis.

EXAM:
PORTABLE CHEST 1 VIEW

[chest ap]
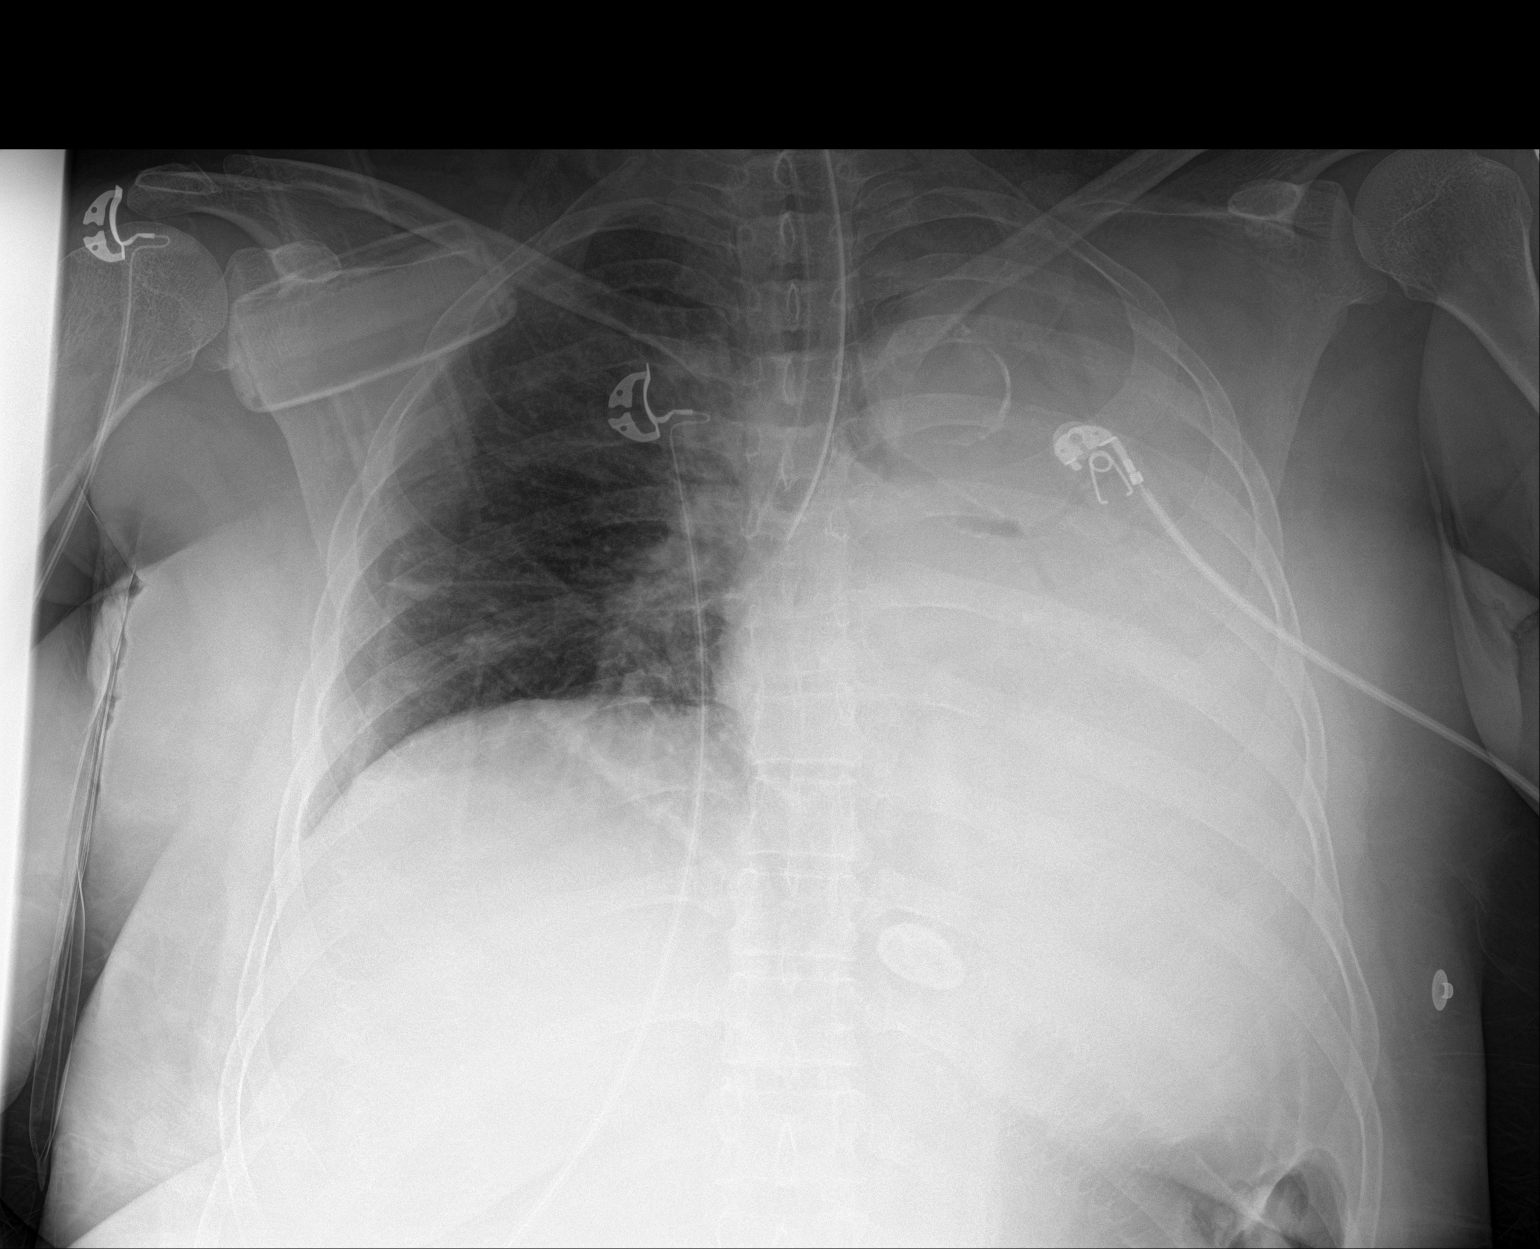

[1 of 1 positions shown; findings below may reference images not displayed]

FINDINGS: Portable AP semi upright view at 9020 hours. Right mainstem bronchus
intubation. Endotracheal tube tip is about 3 cm distal to the
carina. Right lung is well aerated and the left lung is subtotally
collapsed with leftward shift of the mediastinum. Calcified aortic
atherosclerosis. No pneumothorax.

2.5 cm oval dense calcification projecting in the upper abdomen was
related to the posterior mediastinum on CTA in [REDACTED]. Negative
visible bowel gas.
IMPRESSION: 1. Right mainstem bronchus intubation with left lung collapse. See
subsequent repeat portable chest at 5040 hours today.
2. Aortic Atherosclerosis ([id number].X).

## 2022-10-02 IMAGING — CT CT HEAD W/O CM
3 of 4 series · 16 of 47 positions shown, 19 images · non-contrast
Comparison: CT head May 17, 2021.

CLINICAL DATA: Head trauma, abnormal mental status (Age 18-64y)



[Series 4: head 2.0 h70h · axial · 0.41mm/px · z∈[-162,-16]mm · 10 of 83 slices shown, 13 images]
[im 5/83  brain]
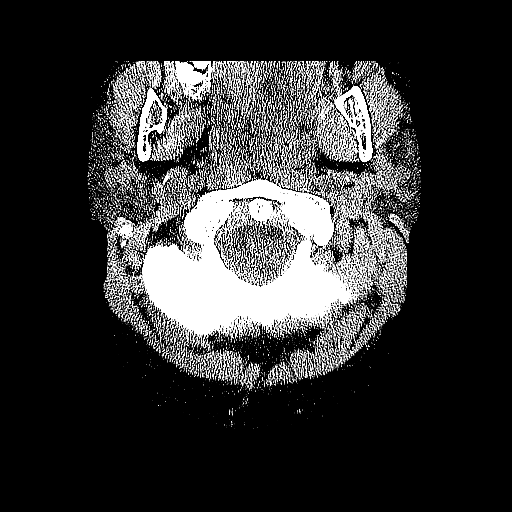
[im 5/83  bone]
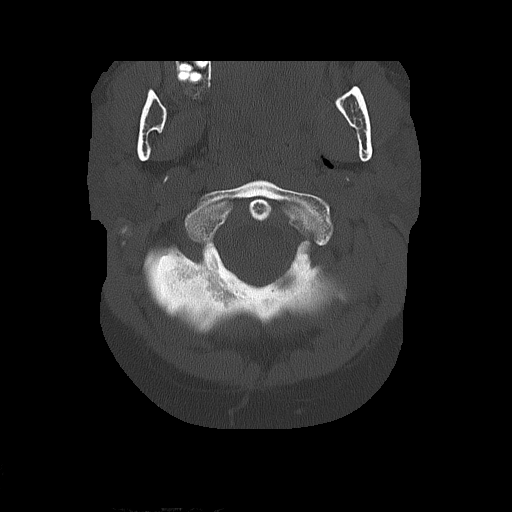
[im 13/83  brain]
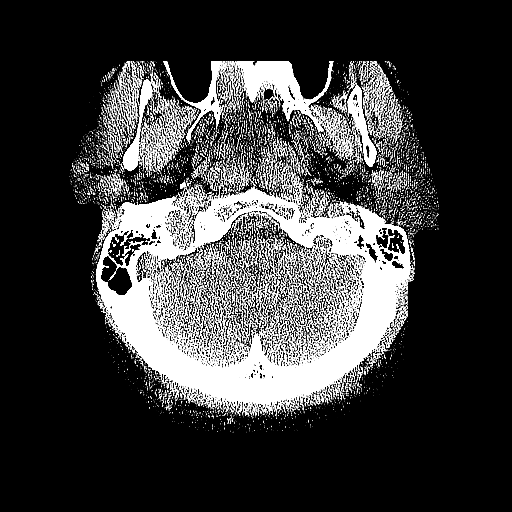
[im 21/83  brain]
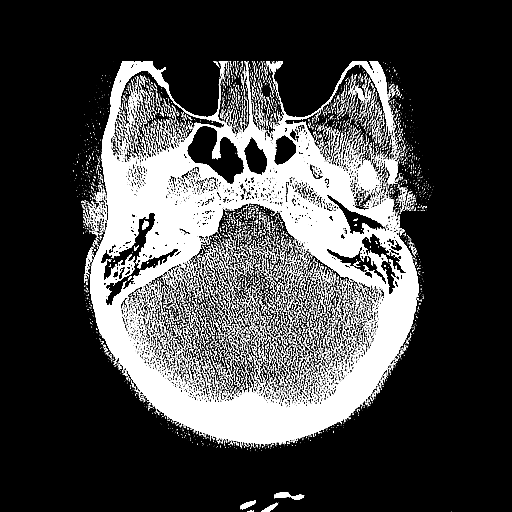
[im 29/83  brain]
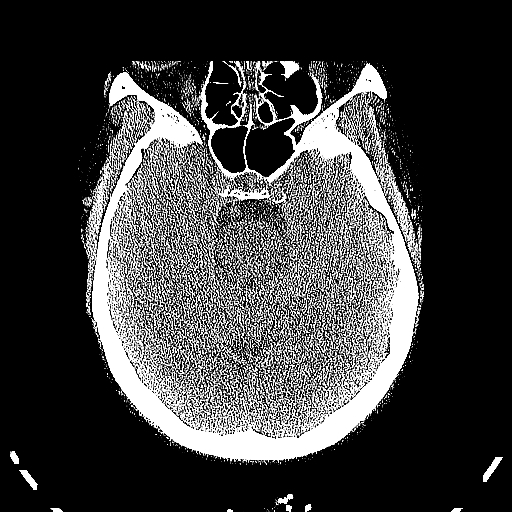
[im 37/83  brain]
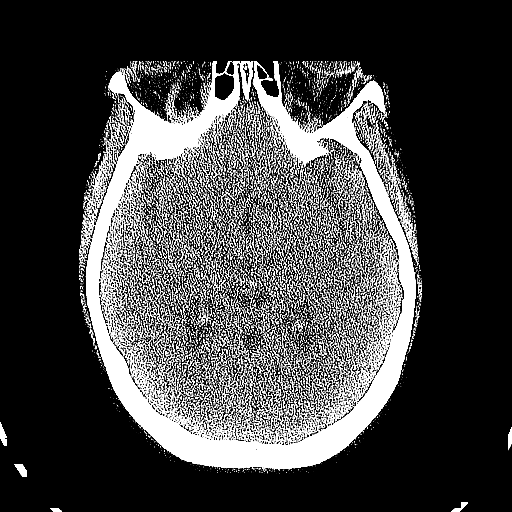
[im 37/83  bone]
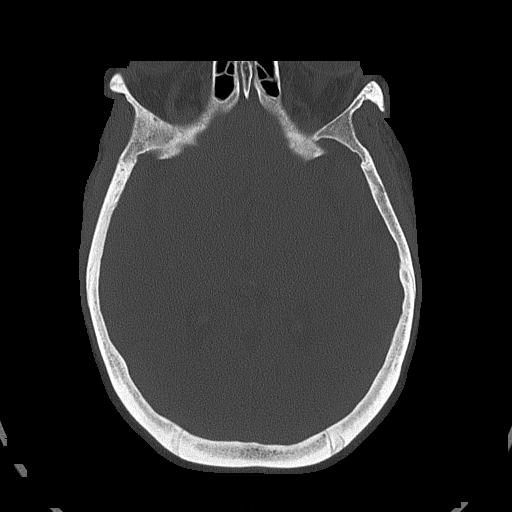
[im 46/83  brain]
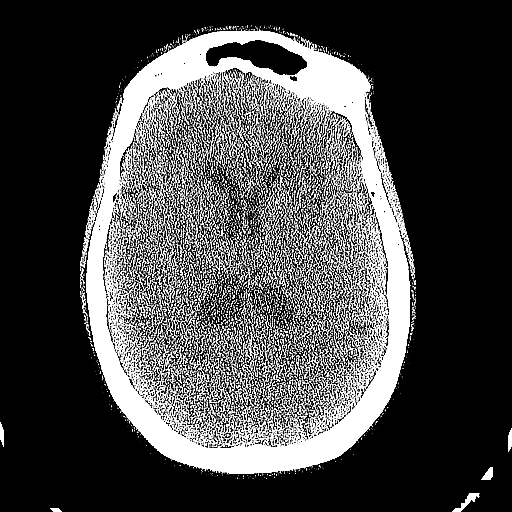
[im 54/83  brain]
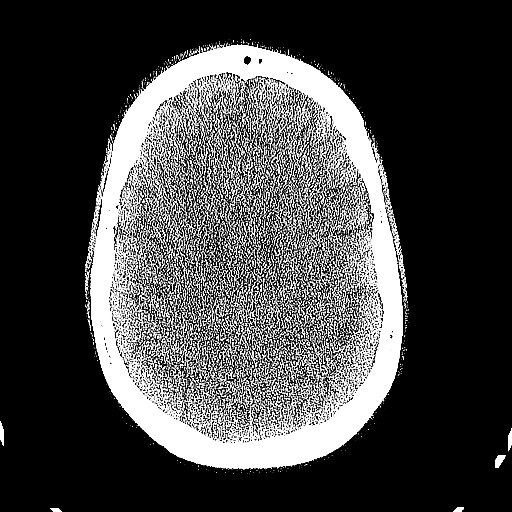
[im 62/83  brain]
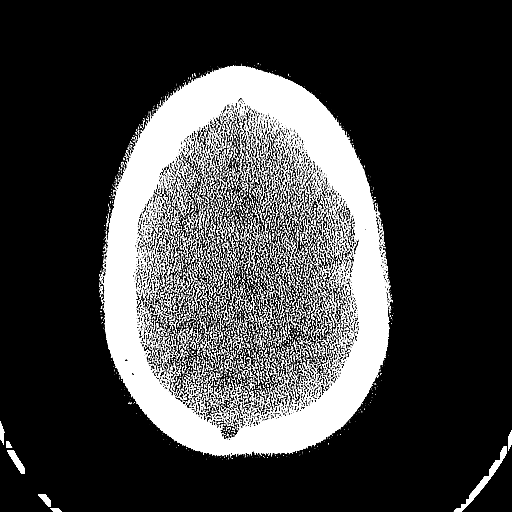
[im 70/83  brain]
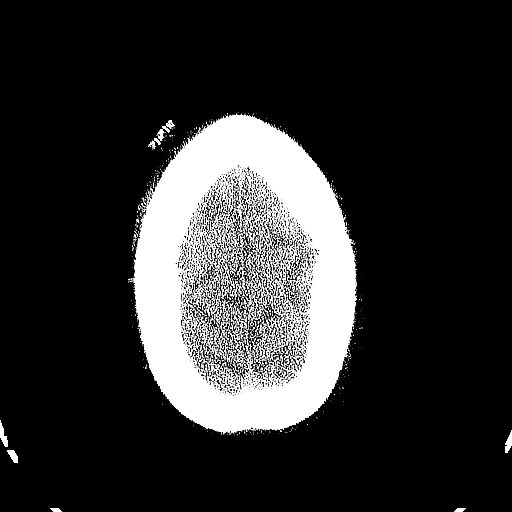
[im 70/83  bone]
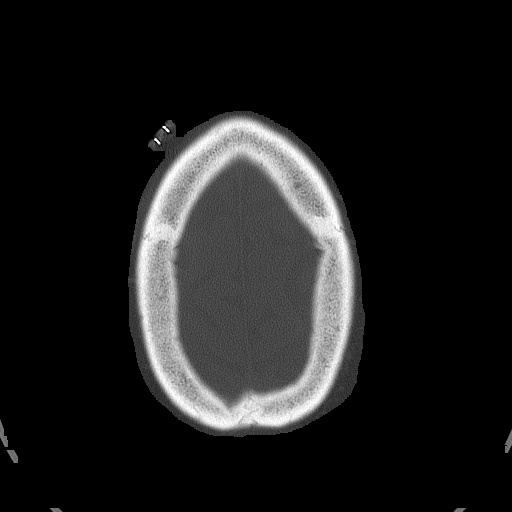
[im 78/83  brain]
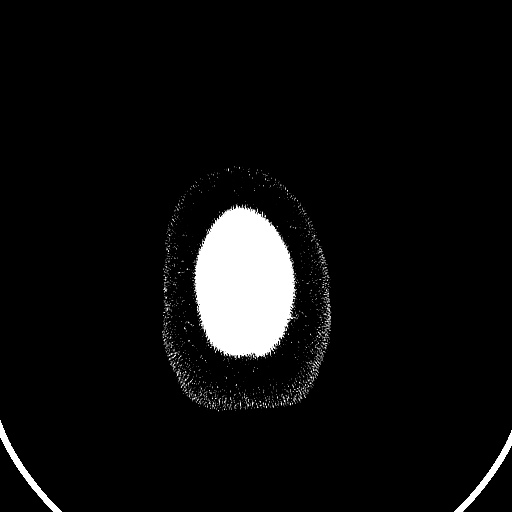

[Series 5: head 3.0 mpr cor · coronal · 0.31mm/px · 3 of 70 slices shown]
[im 24/70  brain]
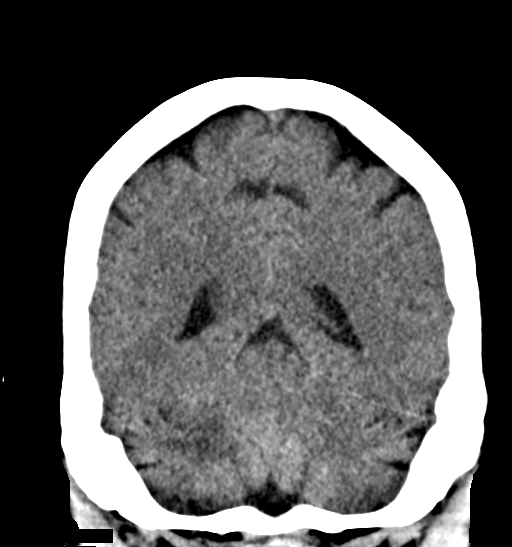
[im 31/70  brain]
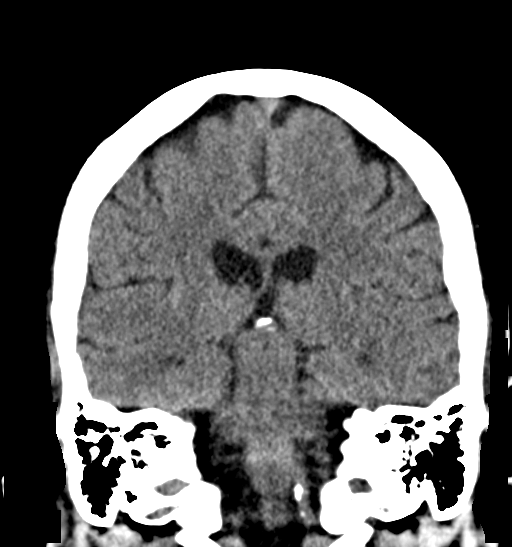
[im 39/70  brain]
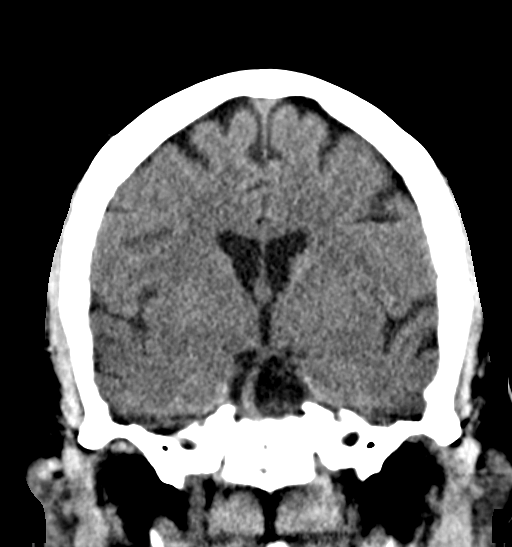

[Series 6: head 3.0 mpr sag · sagittal · 0.34mm/px · 3 of 54 slices shown]
[im 18/54  brain]
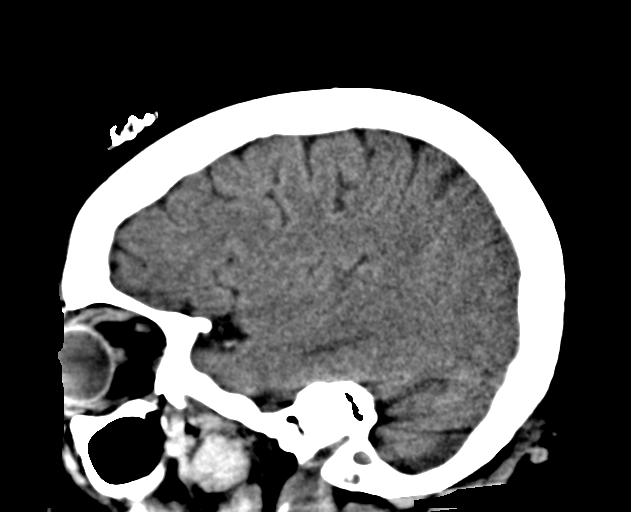
[im 27/54  brain]
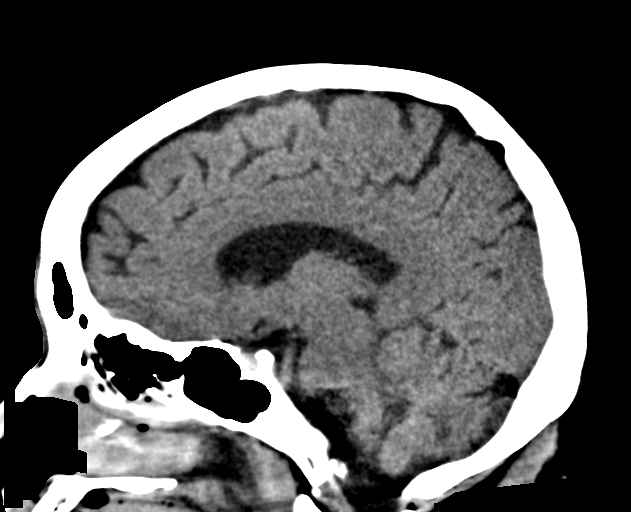
[im 36/54  brain]
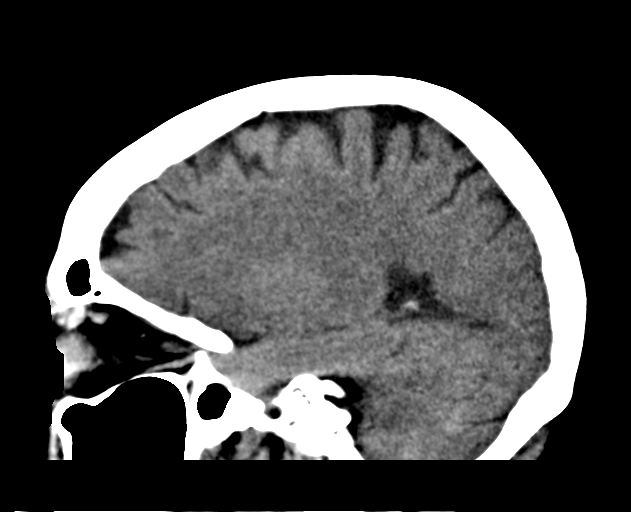

[16 of 47 positions shown; findings below may reference images not displayed]

FINDINGS: Brain: No evidence of acute infarction, hemorrhage, hydrocephalus,
extra-axial collection or mass lesion/mass effect.

Vascular: No hyperdense vessel identified. Calcific intracranial
atherosclerosis.

Skull: Right frontal scalp skin staples. No evidence of acute
calvarial fracture.

Sinuses/Orbits: Largely clear sinuses.  Unremarkable orbits.

Other: No mastoid effusions.
IMPRESSION: No evidence of acute intracranial abnormality.

## 2022-12-14 ENCOUNTER — Emergency Department (HOSPITAL_COMMUNITY)
Admission: EM | Admit: 2022-12-14 | Discharge: 2023-01-06 | Disposition: E | Payer: 59 | Attending: Emergency Medicine | Admitting: Emergency Medicine

## 2022-12-14 DIAGNOSIS — R7309 Other abnormal glucose: Secondary | ICD-10-CM | POA: Insufficient documentation

## 2022-12-14 DIAGNOSIS — N186 End stage renal disease: Secondary | ICD-10-CM | POA: Diagnosis not present

## 2022-12-14 DIAGNOSIS — I469 Cardiac arrest, cause unspecified: Secondary | ICD-10-CM | POA: Insufficient documentation

## 2022-12-14 MED ORDER — EPINEPHRINE 1 MG/10ML IJ SOSY
PREFILLED_SYRINGE | INTRAMUSCULAR | Status: AC | PRN
  Administered 2022-12-14: 1 mg via INTRAVENOUS

## 2022-12-14 MED ORDER — CALCIUM CHLORIDE 10 % IV SOLN
INTRAVENOUS | Status: AC | PRN
  Administered 2022-12-14: 1 g via INTRAVENOUS

## 2022-12-14 MED ORDER — EPINEPHRINE 1 MG/10ML IJ SOSY
PREFILLED_SYRINGE | INTRAMUSCULAR | Status: AC | PRN
  Administered 2022-12-14 (×2): 1 mg via INTRAVENOUS

## 2022-12-14 MED ORDER — SODIUM BICARBONATE 8.4 % IV SOLN
INTRAVENOUS | Status: AC | PRN
Start: 2022-12-14 — End: 2022-12-14
  Administered 2022-12-14: 50 meq via INTRAVENOUS

## 2022-12-15 LAB — CBG MONITORING, ED: Glucose-Capillary: 110 mg/dL — ABNORMAL HIGH (ref 70–99)

## 2022-12-15 NOTE — Progress Notes (Signed)
I responded to a page from the nurse to provide spiritual support for the patient's family. I arrived at the family consultation room where many family members were present. I provided spiritual support through pastoral presence, sharing words of comfort, and by leading in prayer.    01-Jan-2023 0037  Spiritual Encounters  Type of Visit Initial  Care provided to: Family  Conversation partners present during encounter Nurse  Referral source Nurse (RN/NT/LPN)  Reason for visit Patient death  OnCall Visit Yes  Interventions  Spiritual Care Interventions Made Compassionate presence;Bereavement/grief support;Prayer    Chaplain Dr Melvyn Novas

## 2023-01-06 NOTE — Code Documentation (Signed)
LMA removed by MD Bebe Shaggy

## 2023-01-06 NOTE — Code Documentation (Signed)
Pulse check.   

## 2023-01-06 NOTE — Code Documentation (Signed)
MD Wickline [place] heart for cardiac activity

## 2023-01-06 NOTE — ED Provider Notes (Signed)
[place] EMERGENCY DEPARTMENT AT [facility] Provider Note   CSN: [id number] Arrival date & time: 12/10/2022  2301     History {Add pertinent medical, surgical, social history, OB history to HPI:1} Chief Complaint  Patient presents with   Cardiac Arrest   Level 5 caveat due to unresponsive Latasha Drake is a 42 y.o. female.   Cardiac Arrest  Patient with history of ESRD presents as cardiac arrest.  EMS reports they were called to the house and she was unresponsive.  Unknown downtime.  CPR started at around 2158 and initial rhythm was PEA.  EMS reports the patient had multiple bottles around her at the time.  No other details are known as what caused this arrest  Patient received multiple rounds of CPR, epinephrine, calcium and bicarb.  She was also defibrillated multiple times    Home Medications Prior to Admission medications   Medication Sig Start Date End Date Taking? Authorizing Provider  busPIRone (BUSPAR) 5 MG tablet Take 5 mg by mouth 3 (three) times daily as needed (for anxiety).    [provider]  cyclobenzaprine (FLEXERIL) 10 MG tablet Take 1 tablet (10 mg total) by mouth 2 (two) times daily as needed for muscle spasms. 08/28/22   Margarita Grizzle, MD  diazepam (VALIUM) 2 MG tablet Take 2 mg by mouth See admin instructions. Take 2 mg by mouth as needed for scans    [provider]  labetalol (NORMODYNE) 300 MG tablet  07/20/20   [provider]  ondansetron (ZOFRAN-ODT) 4 MG disintegrating tablet Take 1 tablet (4 mg total) by mouth every 8 (eight) hours as needed for nausea or vomiting. 05/19/22   Radford Pax, NP  pantoprazole (PROTONIX) 40 MG tablet Take 40 mg by mouth daily before breakfast. 10/27/16   [provider]  paragard intrauterine copper IUD IUD 1 Intra Uterine Device by Intrauterine route once.    [provider]  pregabalin (LYRICA) 150 MG capsule Take 150 mg by mouth at bedtime.    [provider]   rOPINIRole (REQUIP) 3 MG tablet Take 3 mg by mouth at bedtime.    [provider]  sertraline (ZOLOFT) 50 MG tablet Take 50 mg by mouth at bedtime. 04/20/21   [provider]  sevelamer carbonate (RENVELA) 800 MG tablet Take 2,400 mg by mouth 3 (three) times daily with meals. 08/26/20   [provider]  sodium zirconium cyclosilicate (LOKELMA) 10 g PACK packet Take 10 g by mouth daily.    [provider]      Allergies    Lisinopril and Baclofen    Review of Systems   Review of Systems  Unable to perform ROS: Patient unresponsive    Physical Exam Updated Vital Signs BP (!) 148/11   Pulse 78   Resp 14   SpO2 (!) 64%  Physical Exam  ED Results / Procedures / Treatments   Labs (all labs ordered are listed, but only abnormal results are displayed) Labs Reviewed - No data to display  EKG None  Radiology No results found.  Procedures CPR  Date/Time: 12/12/2022 11:22 PM  Performed by: Zadie Rhine, MD Authorized by: Zadie Rhine, MD  CPR Procedure Details:    CPR/ACLS performed in the ED: Yes     Duration of CPR (minutes):  30   Outcome: Pt declared dead    CPR performed via ACLS guidelines under my direct supervision.  See RN documentation for details including defibrillator use, medications, doses and  timing. Procedure Name: Intubation Date/Time: 12/30/2022 11:15 PM  Performed by: Zadie Rhine, MDPre-anesthesia Checklist: Suction available and Patient being monitored Laryngoscope Size: Glidescope Grade View: Grade I Tube size: 7.5 mm Number of attempts: 1 Airway Equipment and Method: Video-laryngoscopy Placement Confirmation: ETT inserted through vocal cords under direct vision and CO2 detector Secured at: 25 cm Tube secured with: ETT holder      {Document cardiac monitor, telemetry assessment procedure when appropriate:1}  Medications Ordered in ED Medications  EPINEPHrine (ADRENALIN) 1 MG/10ML injection (1 mg  Intravenous Given 12/27/2022 2258)  sodium bicarbonate injection (50 mEq Intravenous Given 12/11/2022 2300)  calcium chloride injection (1 g Intravenous Given 12/24/2022 2302)  EPINEPHrine (ADRENALIN) 1 MG/10ML injection (1 mg Intravenous Given 12/19/2022 2311)  EPINEPHrine (ADRENALIN) 1 MG/10ML injection (1 mg Intravenous Given 01/03/2023 2316)    ED Course/ Medical Decision Making/ A&P   {   Click here for ABCD2, HEART and other calculatorsREFRESH Note before signing :1}                              Medical Decision Making  ***  {Document critical care time when appropriate:1} {Document review of labs and clinical decision tools ie heart score, Chads2Vasc2 etc:1}  {Document your independent review of radiology images, and any outside records:1} {Document your discussion with family members, caretakers, and with consultants:1} {Document social determinants of health affecting pt's care:1} {Document your decision making why or why not admission, treatments were needed:1} Final Clinical Impression(s) / ED Diagnoses Final diagnoses:  Cardiac arrest (HCC)    Rx / DC Orders ED Discharge Orders     None

## 2023-01-06 NOTE — ED Triage Notes (Signed)
Patient arrived with CPR in progress. Latasha Drake in use. Per EMS patient found unresponsive with unknown downtime. Initial rhythm PEA, CPR initiated at 2158, at 2238 ROSC obtained for 15 to 18 minutes then lost pulse again and CPR restarted.   EMS reports total time of CPR 40 minutes.  EMS reports administering: 2 mg narcan  Calcium Bicarb  7 Epi Epi Gtt  IO LLL

## 2023-01-06 DEATH — deceased
